# Patient Record
Sex: Female | Born: 1950 | Race: White | Hispanic: No | Marital: Married | Smoking: Never smoker
Health system: Southern US, Community
[De-identification: ages and names within clinical notes are randomized; demographics above are authoritative.]

## PROBLEM LIST (undated history)

## (undated) DIAGNOSIS — G4730 Sleep apnea, unspecified: Secondary | ICD-10-CM

## (undated) DIAGNOSIS — E039 Hypothyroidism, unspecified: Secondary | ICD-10-CM

## (undated) HISTORY — PX: TONSILLECTOMY: SHX5217

## (undated) HISTORY — PX: SHOULDER SURGERY: SHX246

## (undated) HISTORY — PX: CERVICAL DISC SURGERY: SHX588

## (undated) HISTORY — PX: ABDOMINAL HYSTERECTOMY: SHX81

## (undated) HISTORY — PX: LAPAROSCOPIC COLON RESECTION: SUR791

## (undated) HISTORY — PX: COLONOSCOPY: SHX174

## (undated) HISTORY — PX: EYE SURGERY: SHX253

---

## 2013-02-07 DIAGNOSIS — N3281 Overactive bladder: Secondary | ICD-10-CM | POA: Insufficient documentation

## 2019-07-14 HISTORY — PX: TRANSTHORACIC ECHOCARDIOGRAM: SHX275

## 2019-11-13 HISTORY — DX: Morbid (severe) obesity due to excess calories: E66.01

## 2020-01-02 HISTORY — PX: BARIATRIC SURGERY: SHX1103

## 2020-03-16 DIAGNOSIS — Z9884 Bariatric surgery status: Secondary | ICD-10-CM | POA: Insufficient documentation

## 2020-11-02 HISTORY — PX: TOTAL HIP ARTHROPLASTY: SHX124

## 2020-11-06 HISTORY — PX: ESOPHAGOGASTRODUODENOSCOPY: SHX1529

## 2021-01-22 ENCOUNTER — Encounter: Payer: Self-pay | Admitting: Nurse Practitioner

## 2021-01-22 DIAGNOSIS — E063 Autoimmune thyroiditis: Secondary | ICD-10-CM | POA: Insufficient documentation

## 2021-01-22 DIAGNOSIS — F419 Anxiety disorder, unspecified: Secondary | ICD-10-CM | POA: Insufficient documentation

## 2021-01-22 DIAGNOSIS — G4733 Obstructive sleep apnea (adult) (pediatric): Secondary | ICD-10-CM | POA: Insufficient documentation

## 2021-01-27 ENCOUNTER — Other Ambulatory Visit: Payer: Self-pay

## 2021-01-27 ENCOUNTER — Encounter: Payer: Self-pay | Admitting: Nurse Practitioner

## 2021-01-27 ENCOUNTER — Ambulatory Visit (INDEPENDENT_AMBULATORY_CARE_PROVIDER_SITE_OTHER): Payer: Medicare Other | Admitting: Nurse Practitioner

## 2021-01-27 VITALS — BP 93/59 | HR 53 | Temp 98.2°F | Ht 67.0 in | Wt 167.6 lb

## 2021-01-27 DIAGNOSIS — Z7689 Persons encountering health services in other specified circumstances: Secondary | ICD-10-CM

## 2021-01-27 DIAGNOSIS — D1724 Benign lipomatous neoplasm of skin and subcutaneous tissue of left leg: Secondary | ICD-10-CM

## 2021-01-27 DIAGNOSIS — E039 Hypothyroidism, unspecified: Secondary | ICD-10-CM | POA: Diagnosis not present

## 2021-01-27 DIAGNOSIS — F419 Anxiety disorder, unspecified: Secondary | ICD-10-CM

## 2021-01-27 DIAGNOSIS — G4733 Obstructive sleep apnea (adult) (pediatric): Secondary | ICD-10-CM

## 2021-01-27 DIAGNOSIS — Z9989 Dependence on other enabling machines and devices: Secondary | ICD-10-CM

## 2021-01-27 DIAGNOSIS — Z9884 Bariatric surgery status: Secondary | ICD-10-CM

## 2021-01-27 MED ORDER — CITALOPRAM HYDROBROMIDE 20 MG PO TABS: 20.0000 mg | ORAL_TABLET | Freq: Every day | ORAL | 4 refills | Status: DC

## 2021-01-27 NOTE — Assessment & Plan Note (Addendum)
Chronic, ongoing, currently taking 40 MG Celexa daily.  Discussed with patient recommended dosing for patients >65, 20 MG daily recommended dosing.  She is okay with change to lower dosing, script sent and she will stop the 40 MG dosing.  Denies SI/HI.  Has been on medication for years, but was due to stressful job she no longer has.  Return in 4 weeks for physical.

## 2021-01-27 NOTE — Progress Notes (Signed)
New Patient Office Visit  Subjective:  Patient ID: [name] Robinson, female    DOB: 01-24-1951  Age: 70 y.o. MRN: [id number]  CC:  Chief Complaint  Patient presents with   Establish Care   Mass    Patient would like to discuss mass on her left hip. She was told by last physician that it was fatty tissue and since losing weight patient says that it looks more like a tumor and would like to discuss with provider.    HPI [name] Robinson presents for new patient visit to establish care.  Introduced to Designer, jewellery role and practice setting.  All questions answered.  Discussed provider/patient relationship and expectations.  Recently moved here from [place].  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    Does use her CPAP nightly, was once in 300 lbs range, reports does not need it but still uses.  SKIN LESION History of bariatric surgery on 12/22/19 -- was over 300 lbs and now 160 range.  She is concerned about a mass to upper left thigh which is more prominent since surgery, was told it was a fat mass. Duration: months Location: left upper thigh Painful: yes Itching: no Onset: gradual Context: bigger Associated signs and symptoms:  History of skin cancer: no History of precancerous skin lesions: no Family history of skin cancer: no   HYPOTHYROIDISM Continues on Levothyroxine 137 MCG daily. Thyroid control status:stable Satisfied with current treatment? yes Medication side effects: no Medication compliance: good compliance Etiology of hypothyroidism:  Recent dose adjustment:no Fatigue: no Cold intolerance: no Heat intolerance: no Weight gain: no Weight loss: no Constipation: no Diarrhea/loose stools: no Palpitations: no Lower extremity edema: no Anxiety/depressed mood: no   ANXIETY/STRESS Taking Celexa 40 MG daily - has been on for years when she was in stressful job -- she is okay with reducing to 20 MG which is recommended dose for her  age. Duration:stable Anxious mood: none Excessive worrying: no Irritability: no  Sweating: no Nausea: no Palpitations:no Hyperventilation: no Panic attacks: no Agoraphobia: no  Obscessions/compulsions: no Depressed mood: no Depression screen PHQ 2/9 01/27/2021  Decreased Interest 0  Down, Depressed, Hopeless 0  PHQ - 2 Score 0   Anhedonia: no Weight changes: no Insomnia: none Hypersomnia: no Fatigue/loss of energy: no Feelings of worthlessness: no Feelings of guilt: no Impaired concentration/indecisiveness: no Suicidal ideations: no  Crying spells: no Recent Stressors/Life Changes: no   Relationship problems: no   Family stress: no     Financial stress: no    Job stress: no    Recent death/loss: no    History reviewed. No pertinent past medical history.  Past Surgical History:  Procedure Laterality Date   BARIATRIC SURGERY N/A 01/02/2020   CERVICAL DISC SURGERY N/A    EYE SURGERY     SHOULDER SURGERY Left    TONSILLECTOMY N/A    TOTAL HIP ARTHROPLASTY Right 10/2020    Family History  Problem Relation Age of Onset   Thyroid disease Mother    Cancer - Lung Father     Social History   Socioeconomic History   Marital status: Married    Spouse name: Not on file   Number of children: Not on file   Years of education: Not on file   Highest education level: Not on file  Occupational History   Not on file  Tobacco Use   Smoking status: Never   Smokeless tobacco: Never  Vaping Use   Vaping Use: Never used  Substance and  Sexual Activity   Alcohol use: Never   Drug use: Never   Sexual activity: Not Currently  Other Topics Concern   Not on file  Social History Narrative   Not on file   Social Determinants of Health   Financial Resource Strain: Low Risk    Difficulty of Paying Living Expenses: Not hard at all  Food Insecurity: No Food Insecurity   Worried About Running Out of Food in the Last Year: Never true   [place] in the Last Year:  Never true  Transportation Needs: No Transportation Needs   Lack of Transportation (Medical): No   Lack of Transportation (Non-Medical): No  Physical Activity: Insufficiently Active   Days of Exercise per Week: 4 days   Minutes of Exercise per Session: 30 min  Stress: No Stress Concern Present   Feeling of Stress : Not at all  Social Connections: Moderately Isolated   Frequency of Communication with Friends and Family: More than three times a week   Frequency of Social Gatherings with Friends and Family: More than three times a week   Attends Religious Services: Never   Marine scientist or Organizations: No   Attends Music therapist: Never   Marital Status: Married  Human resources officer Violence: Not At Risk   Fear of Current or Ex-Partner: No   Emotionally Abused: No   Physically Abused: No   Sexually Abused: No    ROS Review of Systems  Constitutional:  Negative for activity change, appetite change, diaphoresis, fatigue and fever.  Respiratory:  Negative for cough, chest tightness and shortness of breath.   Cardiovascular:  Negative for chest pain, palpitations and leg swelling.  Gastrointestinal: Negative.   Endocrine: Negative for cold intolerance and heat intolerance.  Musculoskeletal:  Positive for arthralgias.  Neurological: Negative.   Psychiatric/Behavioral: Negative.     Objective:   Today's Vitals: BP (!) 93/59   Pulse (!) 53   Temp 98.2 F (36.8 C) (Oral)   Ht 5\' 7"  (1.702 m)   Wt 167 lb 9.6 oz (76 kg)   SpO2 97%   BMI 26.25 kg/m   Physical Exam Vitals and nursing note reviewed.  Constitutional:      General: She is awake. She is not in acute distress.    Appearance: She is well-developed and well-groomed. She is not ill-appearing or toxic-appearing.  HENT:     Head: Normocephalic.     Right Ear: Hearing normal.     Left Ear: Hearing normal.  Eyes:     General: Lids are normal.        Right eye: No discharge.        Left eye: No  discharge.     Conjunctiva/sclera: Conjunctivae normal.     Pupils: Pupils are equal, round, and reactive to light.  Neck:     Thyroid: No thyromegaly.     Vascular: No carotid bruit.  Cardiovascular:     Rate and Rhythm: Normal rate and regular rhythm.     Heart sounds: Normal heart sounds. No murmur heard.   No gallop.  Pulmonary:     Effort: Pulmonary effort is normal. No accessory muscle usage or respiratory distress.     Breath sounds: Normal breath sounds.  Abdominal:     General: Bowel sounds are normal.     Palpations: Abdomen is soft.  Musculoskeletal:     Cervical back: Normal range of motion and neck supple.     Right lower leg: No  edema.     Left lower leg: No edema.  Lymphadenopathy:     Cervical: No cervical adenopathy.  Skin:    General: Skin is warm and dry.  Neurological:     Mental Status: She is alert and oriented to person, place, and time.     Deep Tendon Reflexes: Reflexes are normal and symmetric.  Psychiatric:        Attention and Perception: Attention normal.        Mood and Affect: Mood normal.        Speech: Speech normal.        Behavior: Behavior normal. Behavior is cooperative.        Thought Content: Thought content normal.    Assessment & Plan:   Problem List Items Addressed This Visit       Respiratory   OSA on CPAP    Ongoing, reports she was told she no longer needs, but continue to use.  Continue CPAP use, as history of OSA and finds comfort with use.         Endocrine   Hypothyroidism    Chronic, ongoing.  Continue current medication regimen and adjust as needed.  Plan for thyroid labs next visit.       Relevant Medications   levothyroxine (SYNTHROID) 137 MCG tablet     Other   History of bariatric surgery    On 12/22/2019 at [facility], was over 300 lbs and now in 160 range.  Overall reports feeling better.  Continue collaboration with Psychologist, sport and exercise at [facility].  Monitor B12 level.       Anxiety    Chronic, ongoing, currently taking  40 MG Celexa daily.  Discussed with patient recommended dosing for patients >65, 20 MG daily recommended dosing.  She is okay with change to lower dosing, script sent and she will stop the 40 MG dosing.  Denies SI/HI.  Has been on medication for years, but was due to stressful job she no longer has.  Return in 4 weeks for physical.       Relevant Medications   citalopram (CELEXA) 20 MG tablet   Lipoma of left lower extremity    Noted post bariatric surgery to left upper thigh, with some discomfort reported to area.  Will obtain soft tissue ultrasound of area and plan on referral to general surgery dependent on findings, as does report increased size and discomfort, may benefit removal.       Relevant Orders   [place] LT LOWER EXTREM LTD SOFT TISSUE NON VASCULAR   Other Visit Diagnoses     Encounter to establish care    -  Primary       Outpatient Encounter Medications as of 01/27/2021  Medication Sig   acetaminophen (TYLENOL) 500 MG tablet Take 1 tablet by mouth every 8 hours for 3 days, then take every 8 hours as needed for mild pain   cetirizine (ZYRTEC) 10 MG tablet Take by mouth.   citalopram (CELEXA) 20 MG tablet Take 1 tablet (20 mg total) by mouth daily.   ergocalciferol (VITAMIN D2) 1.25 MG (50000 UT) capsule    fluticasone (FLONASE) 50 MCG/ACT nasal spray Place into the nose.   levothyroxine (SYNTHROID) 137 MCG tablet Take by mouth.   lipase/protease/amylase (CREON) 12000-38000 units CPEP capsule Take 1 Units by mouth 3 (three) times daily before meals.   [DISCONTINUED] citalopram (CELEXA) 40 MG tablet Take by mouth.   No facility-administered encounter medications on file as of 01/27/2021.    Follow-up: Return  in about 4 weeks (around 02/24/2021) for Annual physical -- Medicare.   Venita Lick, NP

## 2021-01-27 NOTE — Assessment & Plan Note (Signed)
Noted post bariatric surgery to left upper thigh, with some discomfort reported to area.  Will obtain soft tissue ultrasound of area and plan on referral to general surgery dependent on findings, as does report increased size and discomfort, may benefit removal.

## 2021-01-27 NOTE — Assessment & Plan Note (Signed)
Ongoing, reports she was told she no longer needs, but continue to use.  Continue CPAP use, as history of OSA and finds comfort with use.

## 2021-01-27 NOTE — Assessment & Plan Note (Signed)
Chronic, ongoing.  Continue current medication regimen and adjust as needed.  Plan for thyroid labs next visit.

## 2021-01-27 NOTE — Patient Instructions (Signed)

## 2021-01-27 NOTE — Assessment & Plan Note (Signed)
On 12/22/2019 at [facility], was over 300 lbs and now in 160 range.  Overall reports feeling better.  Continue collaboration with Psychologist, sport and exercise at [facility].  Monitor B12 level.

## 2021-02-26 ENCOUNTER — Encounter: Payer: Self-pay | Admitting: Nurse Practitioner

## 2021-02-26 DIAGNOSIS — E559 Vitamin D deficiency, unspecified: Secondary | ICD-10-CM | POA: Insufficient documentation

## 2021-02-28 ENCOUNTER — Encounter: Payer: Self-pay | Admitting: Nurse Practitioner

## 2021-02-28 ENCOUNTER — Other Ambulatory Visit: Payer: Self-pay

## 2021-02-28 ENCOUNTER — Ambulatory Visit (INDEPENDENT_AMBULATORY_CARE_PROVIDER_SITE_OTHER): Payer: Medicare Other | Admitting: Nurse Practitioner

## 2021-02-28 VITALS — BP 108/68 | HR 64 | Temp 98.4°F | Ht 67.0 in | Wt 156.8 lb

## 2021-02-28 DIAGNOSIS — E039 Hypothyroidism, unspecified: Secondary | ICD-10-CM | POA: Diagnosis not present

## 2021-02-28 DIAGNOSIS — E538 Deficiency of other specified B group vitamins: Secondary | ICD-10-CM

## 2021-02-28 DIAGNOSIS — Z1231 Encounter for screening mammogram for malignant neoplasm of breast: Secondary | ICD-10-CM

## 2021-02-28 DIAGNOSIS — S61225A Laceration with foreign body of left ring finger without damage to nail, initial encounter: Secondary | ICD-10-CM | POA: Diagnosis not present

## 2021-02-28 DIAGNOSIS — Z23 Encounter for immunization: Secondary | ICD-10-CM

## 2021-02-28 DIAGNOSIS — S61225D Laceration with foreign body of left ring finger without damage to nail, subsequent encounter: Secondary | ICD-10-CM

## 2021-02-28 DIAGNOSIS — F419 Anxiety disorder, unspecified: Secondary | ICD-10-CM

## 2021-02-28 DIAGNOSIS — Z0000 Encounter for general adult medical examination without abnormal findings: Secondary | ICD-10-CM

## 2021-02-28 DIAGNOSIS — M810 Age-related osteoporosis without current pathological fracture: Secondary | ICD-10-CM | POA: Insufficient documentation

## 2021-02-28 DIAGNOSIS — Z9884 Bariatric surgery status: Secondary | ICD-10-CM

## 2021-02-28 DIAGNOSIS — Z1159 Encounter for screening for other viral diseases: Secondary | ICD-10-CM

## 2021-02-28 DIAGNOSIS — M85851 Other specified disorders of bone density and structure, right thigh: Secondary | ICD-10-CM | POA: Diagnosis not present

## 2021-02-28 DIAGNOSIS — E559 Vitamin D deficiency, unspecified: Secondary | ICD-10-CM

## 2021-02-28 DIAGNOSIS — E7800 Pure hypercholesterolemia, unspecified: Secondary | ICD-10-CM

## 2021-02-28 DIAGNOSIS — M85859 Other specified disorders of bone density and structure, unspecified thigh: Secondary | ICD-10-CM | POA: Insufficient documentation

## 2021-02-28 MED ORDER — SHINGRIX 50 MCG/0.5ML IM SUSR: 0.5000 mL | Freq: Once | INTRAMUSCULAR | 0 refills | Status: AC

## 2021-02-28 MED ORDER — ERGOCALCIFEROL 1.25 MG (50000 UT) PO CAPS: 50000.0000 [IU] | ORAL_CAPSULE | ORAL | 5 refills | Status: DC

## 2021-02-28 MED ORDER — SHINGRIX 50 MCG/0.5ML IM SUSR: 0.5000 mL | Freq: Once | INTRAMUSCULAR | 0 refills | Status: DC

## 2021-02-28 NOTE — Assessment & Plan Note (Signed)
Noted on DEXA April 2021, plan on repeat in 5 years and continue weekly Vitamin D supplement + adequate calcium intake. 

## 2021-02-28 NOTE — Patient Instructions (Addendum)
[facility]  Address: [address], [place], [place] [address]  Phone: [phone]  Mammogram A mammogram is an X-ray of the breasts. This is done to check for changes that are not normal. This test can look for changes that may be caused by breast cancer or other problems. Mammograms are regularly done on women beginning at age 68. A man may have a mammogram if he has a lump or swelling in his breast. Tell a doctor: About any allergies you have. If you have breast implants. If you have had breast disease, biopsy, or surgery. If you have a family history of breast cancer. If you are breastfeeding. Whether you are pregnant or may be pregnant. What are the risks? Generally, this is a safe procedure. But problems may occur, including: Being exposed to radiation. Radiation levels are very low with this test. The need for more tests. The results were not read properly. Trouble finding breast cancer in women with dense breasts. What happens before the test? Have this test done about 1-2 weeks after your menstrual period. This is often when your breasts are the least tender. If you are visiting a new doctor or clinic, have any past mammogram images sent to your new doctor's office. Wash your breasts and under your arms on the day of the test. Do not use deodorants, perfumes, lotions, or powders on the day of the test. Take off any jewelry from your neck. Wear clothes that you can change into and out of easily. What happens during the test?  You will take off your clothes from the waist up. You will put on a gown. You will stand in front of the X-ray machine. Each breast will be placed between two plastic or glass plates. The plates will press down on your breast for a few seconds. Try to relax. This does not cause any harm to your breasts. It may not feel comfortable, but it will be very brief. X-rays will be taken from different angles of each  breast. The procedure may vary among doctors and hospitals. What can I expect after the test? The mammogram will be read by a specialist (radiologist). You may need to do parts of the test again. This depends on the quality of the images. You may go back to your normal activities. It is up to you to get the results of your test. Ask how to get your results when they are ready. Summary A mammogram is an X-ray of the breasts. It looks for changes that may be caused by breast cancer or other problems. A man may have this test if he has a lump or swelling in his breast. Before the test, tell your doctor about any breast problems that you have had in the past. Have this test done about 1-2 weeks after your menstrual period. Ask when your test results will be ready. Make sure you get your test results. This information is not intended to replace advice given to you by your health care provider. Make sure you discuss any questions you have with your health care provider. Document Revised: 05/31/2020 Document Reviewed: 05/31/2020 Elsevier Patient Education  2022 [employer] Inc[employer]   

## 2021-02-28 NOTE — Assessment & Plan Note (Signed)
History of low levels with osteopenia noted on DEXA April 2021, continue weekly supplement.  Refills sent and check level today.

## 2021-02-28 NOTE — Assessment & Plan Note (Signed)
Chronic, ongoing, taking reduce Celexa 20 MG (due to age) and tolerating reduction. Denies SI/HI.  Has been on medication for years, but was due to stressful job she no longer has.  Return in 6 months, could consider discontinuation in future if requested by patient.

## 2021-02-28 NOTE — Progress Notes (Addendum)
BP 108/68   Pulse 64   Temp 98.4 F (36.9 C) (Oral)   Ht 5\' 7"  (1.702 m)   Wt 156 lb 12.8 oz (71.1 kg)   SpO2 99%   BMI 24.56 kg/m    Subjective:    Patient ID: [name] Robinson, female    DOB: September 25, 1950, 70 y.o.   MRN: [id number]  HPI: [name] Robinson is a 70 y.o. female presenting on 02/28/2021 for examination.  She currently lives with: Menopausal Symptoms: no  On labs 12/14/20 noted Alkaline Phosphatase = 121, H/H 11.4/33.1.  She reports a small cut on middle right finger, on nail, that is healing.   HYPOTHYROIDISM Continues on Levothyroxine 137 MCG daily. Thyroid control status:stable Satisfied with current treatment? yes Medication side effects: no Medication compliance: good compliance Etiology of hypothyroidism: Recent dose adjustment:no Fatigue: no Cold intolerance: no Heat intolerance: no Weight gain: no Weight loss: no Constipation: no Diarrhea/loose stools: no Palpitations: no Lower extremity edema: no Anxiety/depressed mood: no    ANXIETY/STRESS Taking Celexa 20 MG daily.  History of Vitamin D referral, last level 12/14/20 = 28. Duration:stable Anxious mood: none Excessive worrying: no Irritability: no  Sweating: no Nausea: no Palpitations:no Hyperventilation: no Panic attacks: no Agoraphobia: no  Obscessions/compulsions: no Depressed mood: no Depression screen [facility] 2/9 02/28/2021 02/28/2021 01/27/2021  Decreased Interest 0 0 0  Down, Depressed, Hopeless 0 0 0  PHQ - 2 Score 0 0 0  Altered sleeping 0 - -  Tired, decreased energy 0 - -  Change in appetite 0 - -  Feeling bad or failure about yourself  0 - -  Trouble concentrating 0 - -  Moving slowly or fidgety/restless 0 - -  Suicidal thoughts 0 - -  PHQ-9 Score 0 - -  Difficult doing work/chores Not difficult at all - -  Anhedonia: no Weight changes: no Insomnia: none Hypersomnia: no Fatigue/loss of energy: no Feelings of worthlessness: no Feelings of guilt: no Impaired  concentration/indecisiveness: no Suicidal ideations: no  Crying spells: no Recent Stressors/Life Changes: no   Relationship problems: no   Family stress: no     Financial stress: no    Job stress: no    Recent death/loss: no  GAD 7 : Generalized Anxiety Score 02/28/2021  Nervous, Anxious, on Edge 0  Control/stop worrying 0  Worry too much - different things 0  Trouble relaxing 0  Restless 0  Easily annoyed or irritable 0  Afraid - awful might happen 0  Total GAD 7 Score 0  Anxiety Difficulty Not difficult at all   The patient does not have a history of falls. I did not complete a risk assessment for falls. A plan of care for falls was not documented.   Past Medical History:  History reviewed. No pertinent past medical history.  Surgical History:  Past Surgical History:  Procedure Laterality Date   BARIATRIC SURGERY N/A 01/02/2020   CERVICAL DISC SURGERY N/A    EYE SURGERY     SHOULDER SURGERY Left    TONSILLECTOMY N/A    TOTAL HIP ARTHROPLASTY Right 10/2020    Medications:  Current Outpatient Medications on File Prior to Visit  Medication Sig   acetaminophen (TYLENOL) 500 MG tablet Take 1 tablet by mouth every 8 hours for 3 days, then take every 8 hours as needed for mild pain   cetirizine (ZYRTEC) 10 MG tablet Take by mouth.   citalopram (CELEXA) 20 MG tablet Take 1 tablet (20 mg total) by mouth daily.  fluticasone (FLONASE) 50 MCG/ACT nasal spray Place into the nose.   levothyroxine (SYNTHROID) 137 MCG tablet Take by mouth.   lipase/protease/amylase (CREON) 12000-38000 units CPEP capsule Take 1 Units by mouth 3 (three) times daily before meals.   No current facility-administered medications on file prior to visit.    Allergies:  Allergies  Allergen Reactions   Solifenacin Rash    Patient had bad rash with this med. Patient had bad rash with this med. Patient had bad rash with this med. Patient had bad rash with this med.     Social History:  Social  History   Socioeconomic History   Marital status: Married    Spouse name: Not on file   Number of children: Not on file   Years of education: Not on file   Highest education level: Not on file  Occupational History   Not on file  Tobacco Use   Smoking status: Never   Smokeless tobacco: Never  Vaping Use   Vaping Use: Never used  Substance and Sexual Activity   Alcohol use: Never   Drug use: Never   Sexual activity: Not Currently  Other Topics Concern   Not on file  Social History Narrative   Not on file   Social Determinants of Health   Financial Resource Strain: Low Risk    Difficulty of Paying Living Expenses: Not hard at all  Food Insecurity: No Food Insecurity   Worried About Charity fundraiser in the Last Year: Never true   [place] in the Last Year: Never true  Transportation Needs: No Transportation Needs   Lack of Transportation (Medical): No   Lack of Transportation (Non-Medical): No  Physical Activity: Insufficiently Active   Days of Exercise per Week: 4 days   Minutes of Exercise per Session: 30 min  Stress: No Stress Concern Present   Feeling of Stress : Not at all  Social Connections: Moderately Isolated   Frequency of Communication with Friends and Family: More than three times a week   Frequency of Social Gatherings with Friends and Family: More than three times a week   Attends Religious Services: Never   Marine scientist or Organizations: No   Attends Music therapist: Never   Marital Status: Married  Human resources officer Violence: Not At Risk   Fear of Current or Ex-Partner: No   Emotionally Abused: No   Physically Abused: No   Sexually Abused: No   Social History   Tobacco Use  Smoking Status Never  Smokeless Tobacco Never   Social History   Substance and Sexual Activity  Alcohol Use Never    Family History:  Family History  Problem Relation Age of Onset   Thyroid disease Mother    Cancer - Lung Father      Past medical history, surgical history, medications, allergies, family history and social history reviewed with patient today and changes made to appropriate areas of the chart.   ROS All other ROS negative except what is listed above and in the HPI.      Objective:    BP 108/68   Pulse 64   Temp 98.4 F (36.9 C) (Oral)   Ht 5\' 7"  (1.702 m)   Wt 156 lb 12.8 oz (71.1 kg)   SpO2 99%   BMI 24.56 kg/m   Wt Readings from Last 3 Encounters:  02/28/21 156 lb 12.8 oz (71.1 kg)  01/27/21 167 lb 9.6 oz (76 kg)  Physical Exam Vitals and nursing note reviewed. Exam conducted with a chaperone present.  Constitutional:      General: She is awake. She is not in acute distress.    Appearance: She is well-developed. She is not ill-appearing.  HENT:     Head: Normocephalic and atraumatic.     Right Ear: Hearing, tympanic membrane, ear canal and external ear normal. No drainage.     Left Ear: Hearing, tympanic membrane, ear canal and external ear normal. No drainage.     Nose: Nose normal.     Right Sinus: No maxillary sinus tenderness or frontal sinus tenderness.     Left Sinus: No maxillary sinus tenderness or frontal sinus tenderness.     Mouth/Throat:     Mouth: Mucous membranes are moist.     Pharynx: Oropharynx is clear. Uvula midline. No pharyngeal swelling, oropharyngeal exudate or posterior oropharyngeal erythema.  Eyes:     General: Lids are normal.        Right eye: No discharge.        Left eye: No discharge.     Extraocular Movements: Extraocular movements intact.     Conjunctiva/sclera: Conjunctivae normal.     Pupils: Pupils are equal, round, and reactive to light.     Visual Fields: Right eye visual fields normal and left eye visual fields normal.  Neck:     Thyroid: No thyromegaly.     Vascular: No carotid bruit.     Trachea: Trachea normal.  Cardiovascular:     Rate and Rhythm: Normal rate and regular rhythm.     Heart sounds: Normal heart sounds. No murmur  heard.   No gallop.  Pulmonary:     Effort: Pulmonary effort is normal. No accessory muscle usage or respiratory distress.     Breath sounds: Normal breath sounds.  Chest:  Breasts:    Right: Normal. No axillary adenopathy or supraclavicular adenopathy.     Left: Normal. No axillary adenopathy or supraclavicular adenopathy.  Abdominal:     General: Bowel sounds are normal.     Palpations: Abdomen is soft. There is no hepatomegaly or splenomegaly.     Tenderness: There is no abdominal tenderness.  Musculoskeletal:        General: Normal range of motion.     Cervical back: Normal range of motion and neck supple.     Right lower leg: No edema.     Left lower leg: No edema.  Lymphadenopathy:     Head:     Right side of head: No submental, submandibular, tonsillar, preauricular or posterior auricular adenopathy.     Left side of head: No submental, submandibular, tonsillar, preauricular or posterior auricular adenopathy.     Cervical: No cervical adenopathy.     Upper Body:     Right upper body: No supraclavicular, axillary or pectoral adenopathy.     Left upper body: No supraclavicular, axillary or pectoral adenopathy.  Skin:    General: Skin is warm and dry.     Capillary Refill: Capillary refill takes less than 2 seconds.     Findings: Abrasion present. No rash.     Comments: Small abrasion to left finger, after injury on nail, overall healing with some crusting and pink wound bed.  Neurological:     Mental Status: She is alert and oriented to person, place, and time.     Cranial Nerves: Cranial nerves are intact.     Gait: Gait is intact.     Deep Tendon Reflexes:  Reflexes are normal and symmetric.     Reflex Scores:      Brachioradialis reflexes are 2+ on the right side and 2+ on the left side.      Patellar reflexes are 2+ on the right side and 2+ on the left side. Psychiatric:        Attention and Perception: Attention normal.        Mood and Affect: Mood normal.         Speech: Speech normal.        Behavior: Behavior normal. Behavior is cooperative.        Thought Content: Thought content normal.        Judgment: Judgment normal.    No results found for this or any previous visit.    Assessment & Plan:   Problem List Items Addressed This Visit       Endocrine   Hypothyroidism - Primary    Chronic, ongoing.  Continue current medication regimen and adjust as needed.  TSH, Free T4, and thyroid antibody today.       Relevant Orders   CBC with Differential/Platelet   TSH   T4, free   Thyroid peroxidase antibody     Musculoskeletal and Integument   Osteopenia of femoral neck    Noted on DEXA April 2021, plan on repeat in 5 years and continue weekly Vitamin D supplement + adequate calcium intake.         Other   History of bariatric surgery    On 12/22/2019 at [facility], was over 300 lbs and now in 160 range.  Overall reports feeling better.  Continue collaboration with Psychologist, sport and exercise at [facility].  Monitor B12 level.       Anxiety    Chronic, ongoing, taking reduce Celexa 20 MG (due to age) and tolerating reduction. Denies SI/HI.  Has been on medication for years, but was due to stressful job she no longer has.  Return in 6 months, could consider discontinuation in future if requested by patient.       Vitamin D deficiency    History of low levels with osteopenia noted on DEXA April 2021, continue weekly supplement.  Refills sent and check level today.       Relevant Orders   VITAMIN D 25 Hydroxy (Vit-D Deficiency, Fractures)   Other Visit Diagnoses     Elevated low density lipoprotein (LDL) cholesterol level       Check lipid panel today and initiate medication as needed.   Relevant Orders   Comprehensive metabolic panel   Lipid Panel w/o Chol/HDL Ratio   Laceration with foreign body of left ring finger without damage to nail, subsequent encounter       Overall healing -- not up to date on Td, will provide today.   Relevant Orders   Td vaccine  greater than or equal to 7yo preservative free IM   B12 deficiency       History of low levels, check today with CBC and start supplement as needed -- history of bariatric surgery.   Relevant Orders   CBC with Differential/Platelet   Vitamin B12   Need for shingles vaccine       Shingrix script sent to pharmacy.   Relevant Medications   Zoster Vaccine Adjuvanted [facility]) injection   Zoster Vaccine Adjuvanted [facility]) injection (Start on 08/31/2021)   Encounter for screening mammogram for malignant neoplasm of breast       Mammogram ordered.   Relevant Orders   MM 3D  SCREEN BREAST BILATERAL   Need for Td vaccine       Due for Td, will provide today due to cut on finger.   Relevant Orders   Td vaccine greater than or equal to 7yo preservative free IM   Need for hepatitis C screening test       Hep C screening today per guidelines, discussed with patient.    Relevant Orders   Hepatitis C antibody        Follow up plan: Return in about 6 months (around 08/31/2021) for Hypothyroid, HLD.   LABORATORY TESTING:  - Pap smear: not applicable  IMMUNIZATIONS:   - Tdap: Tetanus vaccination status reviewed: provided today - Influenza: Up to date - Pneumovax: Administered today - Prevnar: Up to date - HPV: Not applicable - Zostavax vaccine:  ordered today  SCREENING: -Mammogram: Up to date -- ordered today - Colonoscopy: Up to date  - Bone Density: Up to date  -Hearing Test: Not applicable  -Spirometry: Not applicable   PATIENT COUNSELING:   Advised to take 1 mg of folate supplement per day if capable of pregnancy.   Sexuality: Discussed sexually transmitted diseases, partner selection, use of condoms, avoidance of unintended pregnancy  and contraceptive alternatives.   Advised to avoid cigarette smoking.  I discussed with the patient that most people either abstain from alcohol or drink within safe limits (<=14/week and <=4 drinks/occasion for males, <=7/weeks and <= 3  drinks/occasion for females) and that the risk for alcohol disorders and other health effects rises proportionally with the number of drinks per week and how often a drinker exceeds daily limits.  Discussed cessation/primary prevention of drug use and availability of treatment for abuse.   Diet: Encouraged to adjust caloric intake to maintain  or achieve ideal body weight, to reduce intake of dietary saturated fat and total fat, to limit sodium intake by avoiding high sodium foods and not adding table salt, and to maintain adequate dietary potassium and calcium preferably from fresh fruits, vegetables, and low-fat dairy products.    Stressed the importance of regular exercise  Injury prevention: Discussed safety belts, safety helmets, smoke detector, smoking near bedding or upholstery.   Dental health: Discussed importance of regular tooth brushing, flossing, and dental visits.    Return in about 6 months (around 08/31/2021) for Hypothyroid, HLD.

## 2021-02-28 NOTE — Assessment & Plan Note (Signed)
On 12/22/2019 at [facility], was over 300 lbs and now in 160 range.  Overall reports feeling better.  Continue collaboration with Psychologist, sport and exercise at [facility].  Monitor B12 level.

## 2021-02-28 NOTE — Assessment & Plan Note (Signed)
Chronic, ongoing.  Continue current medication regimen and adjust as needed.  TSH, Free T4, and thyroid antibody today.

## 2021-03-01 ENCOUNTER — Other Ambulatory Visit: Payer: Self-pay | Admitting: Nurse Practitioner

## 2021-03-01 ENCOUNTER — Ambulatory Visit: Payer: Medicare Other

## 2021-03-01 ENCOUNTER — Encounter: Payer: Self-pay | Admitting: Nurse Practitioner

## 2021-03-01 LAB — LIPID PANEL W/O CHOL/HDL RATIO
Cholesterol, Total: 115 mg/dL (ref 100–199)
HDL: 69 mg/dL (ref >39)
LDL Chol Calc (NIH): 32 mg/dL (ref 0–99)
Triglycerides: 64 mg/dL (ref 0–149)
VLDL Cholesterol Cal: 14 mg/dL (ref 5–40)

## 2021-03-01 LAB — COMPREHENSIVE METABOLIC PANEL
ALT: 34 IU/L — ABNORMAL HIGH (ref 0–32)
AST: 50 IU/L — ABNORMAL HIGH (ref 0–40)
Albumin/Globulin Ratio: 1.6 (ref 1.2–2.2)
Albumin: 3.7 g/dL — ABNORMAL LOW (ref 3.8–4.8)
Alkaline Phosphatase: 132 IU/L — ABNORMAL HIGH (ref 44–121)
BUN/Creatinine Ratio: 25 (ref 12–28)
BUN: 16 mg/dL (ref 8–27)
Bilirubin Total: 0.6 mg/dL (ref 0.0–1.2)
CO2: 23 mmol/L (ref 20–29)
Calcium: 9.3 mg/dL (ref 8.7–10.3)
Chloride: 105 mmol/L (ref 96–106)
Creatinine, Ser: 0.63 mg/dL (ref 0.57–1.00)
Globulin, Total: 2.3 g/dL (ref 1.5–4.5)
Glucose: 76 mg/dL (ref 65–99)
Potassium: 3.6 mmol/L (ref 3.5–5.2)
Sodium: 143 mmol/L (ref 134–144)
Total Protein: 6 g/dL (ref 6.0–8.5)
eGFR: 97 mL/min/{1.73_m2} (ref >59)

## 2021-03-01 LAB — CBC WITH DIFFERENTIAL/PLATELET
Basophils Absolute: 0 10*3/uL (ref 0.0–0.2)
Basos: 1 %
EOS (ABSOLUTE): 0 10*3/uL (ref 0.0–0.4)
Eos: 0 %
Hematocrit: 31.9 % — ABNORMAL LOW (ref 34.0–46.6)
Hemoglobin: 10.6 g/dL — ABNORMAL LOW (ref 11.1–15.9)
Immature Grans (Abs): 0 10*3/uL (ref 0.0–0.1)
Immature Granulocytes: 0 %
Lymphocytes Absolute: 1.4 10*3/uL (ref 0.7–3.1)
Lymphs: 30 %
MCH: 30.5 pg (ref 26.6–33.0)
MCHC: 33.2 g/dL (ref 31.5–35.7)
MCV: 92 fL (ref 79–97)
Monocytes Absolute: 0.3 10*3/uL (ref 0.1–0.9)
Monocytes: 6 %
Neutrophils Absolute: 3 10*3/uL (ref 1.4–7.0)
Neutrophils: 63 %
Platelets: 170 10*3/uL (ref 150–450)
RBC: 3.47 x10E6/uL — ABNORMAL LOW (ref 3.77–5.28)
RDW: 13 % (ref 11.7–15.4)
WBC: 4.7 10*3/uL (ref 3.4–10.8)

## 2021-03-01 LAB — VITAMIN D 25 HYDROXY (VIT D DEFICIENCY, FRACTURES): Vit D, 25-Hydroxy: 42.2 ng/mL (ref 30.0–100.0)

## 2021-03-01 LAB — HEPATITIS C ANTIBODY: Hep C Virus Ab: 0.1 s/co ratio (ref 0.0–0.9)

## 2021-03-01 LAB — VITAMIN B12: Vitamin B-12: 368 pg/mL (ref 232–1245)

## 2021-03-01 LAB — TSH: TSH: 1.25 u[IU]/mL (ref 0.450–4.500)

## 2021-03-01 LAB — THYROID PEROXIDASE ANTIBODY: Thyroperoxidase Ab SerPl-aCnc: 197 IU/mL — ABNORMAL HIGH (ref 0–34)

## 2021-03-01 LAB — T4, FREE: Free T4: 1.08 ng/dL (ref 0.82–1.77)

## 2021-03-01 MED ORDER — LEVOTHYROXINE SODIUM 137 MCG PO TABS
137.0000 ug | ORAL_TABLET | Freq: Every day | ORAL | 4 refills | Status: DC
Start: 2021-03-01 — End: 2021-12-01

## 2021-03-01 NOTE — Progress Notes (Signed)
Good morning, please let Sawyer know her labs have returned: - CBC shows some mild anemia with hemoglobin 10.6, would like to see > 11.  This is most likely related to past GI surgery.  B12 level is on low side of normal.  I would recommend taking Vitamin B12 1000 MCG daily and we will recheck next visit. - Liver function tests were mildly elevated, we will continue to monitor these.  Kidney function normal. - Thyroid labs normal, continue current dosing.  Your thyroid antibody is elevated, most likely your thyroid disorder is Hashimoto thyroiditis, I definitely recommend reading about this and diet. - Remainder of labs are fantastic.  No changes needed.  Any questions? Keep being awesome!!  Thank you for allowing me to participate in your care.  I appreciate you. Kindest regards, Helga Asbury

## 2021-03-03 ENCOUNTER — Other Ambulatory Visit: Payer: Self-pay

## 2021-03-03 ENCOUNTER — Ambulatory Visit
Admission: RE | Admit: 2021-03-03 | Discharge: 2021-03-03 | Disposition: A | Discharge status: Home / Self Care | Payer: Medicare Other | Source: Ambulatory Visit | Attending: Nurse Practitioner | Admitting: Nurse Practitioner

## 2021-03-03 DIAGNOSIS — D1724 Benign lipomatous neoplasm of skin and subcutaneous tissue of left leg: Secondary | ICD-10-CM | POA: Diagnosis present

## 2021-03-04 NOTE — Progress Notes (Signed)
Contacted via [place] afternoon Bhavya, the ultrasound to left upper thigh did show a 5 cm lipoma.  This is a fat-filled cyst.  If it causes issue or pain then we could send to general surgery for assessment, but if it is not bothering you then I recommend monitoring area and if any increase size or pain we can send to surgery.  Have a great weekend. Keep being awesome!!  Thank you for allowing me to participate in your care.  I appreciate you. Kindest regards, Tanazia Achee

## 2021-03-06 DIAGNOSIS — D1724 Benign lipomatous neoplasm of skin and subcutaneous tissue of left leg: Secondary | ICD-10-CM

## 2021-03-10 DIAGNOSIS — K909 Intestinal malabsorption, unspecified: Secondary | ICD-10-CM | POA: Insufficient documentation

## 2021-03-15 ENCOUNTER — Other Ambulatory Visit: Payer: Self-pay

## 2021-03-15 ENCOUNTER — Ambulatory Visit
Admission: RE | Admit: 2021-03-15 | Discharge: 2021-03-15 | Disposition: A | Discharge status: Home / Self Care | Payer: Medicare Other | Source: Ambulatory Visit | Attending: Nurse Practitioner | Admitting: Nurse Practitioner

## 2021-03-15 DIAGNOSIS — Z1231 Encounter for screening mammogram for malignant neoplasm of breast: Secondary | ICD-10-CM | POA: Diagnosis present

## 2021-03-16 ENCOUNTER — Inpatient Hospital Stay
Admission: RE | Admit: 2021-03-16 | Discharge: 2021-03-16 | Disposition: A | Discharge status: Home / Self Care | Payer: Self-pay | Source: Ambulatory Visit | Attending: *Deleted | Admitting: *Deleted

## 2021-03-16 ENCOUNTER — Other Ambulatory Visit: Payer: Self-pay | Admitting: *Deleted

## 2021-03-16 DIAGNOSIS — Z1231 Encounter for screening mammogram for malignant neoplasm of breast: Secondary | ICD-10-CM

## 2021-03-17 ENCOUNTER — Other Ambulatory Visit: Payer: Self-pay

## 2021-03-17 ENCOUNTER — Ambulatory Visit: Payer: Self-pay | Admitting: Surgery

## 2021-03-17 ENCOUNTER — Telehealth: Payer: Self-pay | Admitting: Surgery

## 2021-03-17 ENCOUNTER — Other Ambulatory Visit: Payer: Self-pay | Admitting: Nurse Practitioner

## 2021-03-17 ENCOUNTER — Encounter: Payer: Self-pay | Admitting: Surgery

## 2021-03-17 ENCOUNTER — Ambulatory Visit (INDEPENDENT_AMBULATORY_CARE_PROVIDER_SITE_OTHER): Payer: Medicare Other | Admitting: Surgery

## 2021-03-17 VITALS — BP 96/59 | HR 51 | Temp 98.3°F | Ht 67.0 in | Wt 156.4 lb

## 2021-03-17 DIAGNOSIS — D1724 Benign lipomatous neoplasm of skin and subcutaneous tissue of left leg: Secondary | ICD-10-CM | POA: Diagnosis not present

## 2021-03-17 DIAGNOSIS — R928 Other abnormal and inconclusive findings on diagnostic imaging of breast: Secondary | ICD-10-CM

## 2021-03-17 NOTE — Patient Instructions (Signed)
Our surgery scheduler Pamala Hurry will call you within 24-48 hours to get you scheduled. If you have not heard from her after 48 hours, please call our office. You will not need to get Covid tested before surgery and have the blue sheet available when she calls to write down important information.   If you have any concerns or questions, please feel free to call our office.

## 2021-03-17 NOTE — Progress Notes (Signed)
Patient ID: [name] Robinson, female   DOB: [birth year], 70 y.o.   MRN: [id number]  Chief Complaint: Left thigh mass x2 years  History of Present Illness [name] Robinson is a 70 y.o. female with an increasing left lateral thigh mass for the last 2 years.  Ultrasound evaluated at 5 cm and consistent with lipoma.  It has become progressively more uncomfortable.  She would like to pursue its excision.  Past Medical History History reviewed. No pertinent past medical history.    Past Surgical History:  Procedure Laterality Date   BARIATRIC SURGERY N/A 01/02/2020   CERVICAL DISC SURGERY N/A    EYE SURGERY     SHOULDER SURGERY Left    TONSILLECTOMY N/A    TOTAL HIP ARTHROPLASTY Right 10/2020    Allergies  Allergen Reactions   Solifenacin Rash    Patient had bad rash with this med. Patient had bad rash with this med. Patient had bad rash with this med. Patient had bad rash with this med.     Current Outpatient Medications  Medication Sig Dispense Refill   acetaminophen (TYLENOL) 500 MG tablet Take 1 tablet by mouth every 8 hours for 3 days, then take every 8 hours as needed for mild pain     cetirizine (ZYRTEC) 10 MG tablet Take by mouth.     citalopram (CELEXA) 20 MG tablet Take 1 tablet (20 mg total) by mouth daily. 90 tablet 4   ergocalciferol (VITAMIN D2) 1.25 MG (50000 UT) capsule Take 1 capsule (50,000 Units total) by mouth once a week. 12 capsule 5   fluticasone (FLONASE) 50 MCG/ACT nasal spray Place into the nose.     levothyroxine (SYNTHROID) 137 MCG tablet Take 1 tablet (137 mcg total) by mouth daily before breakfast. 90 tablet 4   lipase/protease/amylase (CREON) 12000-38000 units CPEP capsule Take 1 Units by mouth 3 (three) times daily before meals.     [START ON 08/31/2021] Zoster Vaccine Adjuvanted [facility]) injection Inject 0.5 mLs into the muscle once for 1 dose. Dose # 2 0.5 mL 0   No current facility-administered medications for this visit.    Family History Family  History  Problem Relation Age of Onset   Thyroid disease Mother    Cancer - Lung Father       Social History Social History   Tobacco Use   Smoking status: Never   Smokeless tobacco: Never  Vaping Use   Vaping Use: Never used  Substance Use Topics   Alcohol use: Never   Drug use: Never        Review of Systems  Constitutional: Negative.   HENT: Negative.    Eyes: Negative.   Respiratory: Negative.    Cardiovascular: Negative.   Gastrointestinal: Negative.   Genitourinary: Negative.   Skin: Negative.   Neurological: Negative.   Psychiatric/Behavioral: Negative.       Physical Exam Blood pressure (!) 96/59, pulse (!) 51, temperature 98.3 F (36.8 C), temperature source Oral, height '5\' 7"'$  (1.702 m), weight 156 lb 6.4 oz (70.9 kg), SpO2 99 %. Last Weight  Most recent update: 03/17/2021  9:47 AM    Weight  70.9 kg (156 lb 6.4 oz)             CONSTITUTIONAL: Well developed, and nourished, appropriately responsive and aware without distress.   EYES: Sclera non-icteric.   EARS, NOSE, MOUTH AND THROAT: Mask worn.    Hearing is intact to voice.  NECK: Trachea is midline, and there is no jugular venous distension.  LYMPH NODES:  Lymph nodes in the neck are not enlarged. RESPIRATORY:  Lungs are clear, and breath sounds are equal bilaterally. Normal respiratory effort without pathologic use of accessory muscles. CARDIOVASCULAR: Heart is regular in rate and rhythm. GI: The abdomen is soft, nontender, and nondistended. There were no palpable masses. I did not appreciate hepatosplenomegaly. There were normal bowel sounds. MUSCULOSKELETAL:  Symmetrical muscle tone appreciated in all four extremities.    SKIN: Skin turgor is normal. No pathologic skin lesions appreciated.  Over the left lateral hip area there is a 6 inch soft supple multilobular mass consistent with mature lipoma.  There appears to be no involvement of the overlying dermis. NEUROLOGIC:  Motor and sensation  appear grossly normal.  Cranial nerves are grossly without defect. PSYCH:  Alert and oriented to person, place and time. Affect is appropriate for situation.  Data Reviewed I have personally reviewed what is currently available of the patient's imaging, recent labs and medical records.   Labs:  CBC Latest Ref Rng & Units 02/28/2021  WBC 3.4 - 10.8 x10E3/uL 4.7  Hemoglobin 11.1 - 15.9 g/dL 10.6(L)  Hematocrit 34.0 - 46.6 % 31.9(L)  Platelets 150 - 450 x10E3/uL 170   CMP Latest Ref Rng & Units 02/28/2021  Glucose 65 - 99 mg/dL 76  BUN 8 - 27 mg/dL 16  Creatinine 0.57 - 1.00 mg/dL 0.63  Sodium 134 - 144 mmol/L 143  Potassium 3.5 - 5.2 mmol/L 3.6  Chloride 96 - 106 mmol/L 105  CO2 20 - 29 mmol/L 23  Calcium 8.7 - 10.3 mg/dL 9.3  Total Protein 6.0 - 8.5 g/dL 6.0  Total Bilirubin 0.0 - 1.2 mg/dL 0.6  Alkaline Phos 44 - 121 IU/L 132(H)  AST 0 - 40 IU/L 50(H)  ALT 0 - 32 IU/L 34(H)      Imaging:  Within last 24 hrs: No results found.  Assessment    Left lateral thigh mass, likely benign lipoma. 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 Risk and benefits above procedure been discussed in detail including but not limited to anesthesia, bleeding, infection, recurrence etc.  I believe she understands, had her questions adequately addressed and desires to proceed.  No guarantees were ever expressed or implied.  Face-to-face time spent with the patient and accompanying care providers(if present) was 25 minutes, with more than 50% of the time spent counseling, educating, and coordinating care of the patient.    These  notes generated with voice recognition software. I apologize for typographical errors.  Ronny Bacon M.D., FACS 03/17/2021, 11:15 AM

## 2021-03-17 NOTE — Telephone Encounter (Signed)
Patient has been advised of Pre-Admission date/time, COVID Testing date and Surgery date.  Surgery Date: 04/13/21 Preadmission Testing Date: 04/04/21 (phone 1p-5p) Covid Testing Date: Not needed.     Patient has been made aware to call [phone], between 1-3:00pm the day before surgery, to find out what time to arrive for surgery.    

## 2021-03-17 NOTE — H&P (View-Only) (Signed)
Patient ID: [name] Robinson, female   DOB: 1950-11-28, 70 y.o.   MRN: [id number]  Chief Complaint: Left thigh mass x2 years  History of Present Illness [name] Robinson is a 70 y.o. female with an increasing left lateral thigh mass for the last 2 years.  Ultrasound evaluated at 5 cm and consistent with lipoma.  It has become progressively more uncomfortable.  She would like to pursue its excision.  Past Medical History History reviewed. No pertinent past medical history.    Past Surgical History:  Procedure Laterality Date   BARIATRIC SURGERY N/A 01/02/2020   CERVICAL DISC SURGERY N/A    EYE SURGERY     SHOULDER SURGERY Left    TONSILLECTOMY N/A    TOTAL HIP ARTHROPLASTY Right 10/2020    Allergies  Allergen Reactions   Solifenacin Rash    Patient had bad rash with this med. Patient had bad rash with this med. Patient had bad rash with this med. Patient had bad rash with this med.     Current Outpatient Medications  Medication Sig Dispense Refill   acetaminophen (TYLENOL) 500 MG tablet Take 1 tablet by mouth every 8 hours for 3 days, then take every 8 hours as needed for mild pain     cetirizine (ZYRTEC) 10 MG tablet Take by mouth.     citalopram (CELEXA) 20 MG tablet Take 1 tablet (20 mg total) by mouth daily. 90 tablet 4   ergocalciferol (VITAMIN D2) 1.25 MG (50000 UT) capsule Take 1 capsule (50,000 Units total) by mouth once a week. 12 capsule 5   fluticasone (FLONASE) 50 MCG/ACT nasal spray Place into the nose.     levothyroxine (SYNTHROID) 137 MCG tablet Take 1 tablet (137 mcg total) by mouth daily before breakfast. 90 tablet 4   lipase/protease/amylase (CREON) 12000-38000 units CPEP capsule Take 1 Units by mouth 3 (three) times daily before meals.     [START ON 08/31/2021] Zoster Vaccine Adjuvanted [facility]) injection Inject 0.5 mLs into the muscle once for 1 dose. Dose # 2 0.5 mL 0   No current facility-administered medications for this visit.    Family History Family  History  Problem Relation Age of Onset   Thyroid disease Mother    Cancer - Lung Father       Social History Social History   Tobacco Use   Smoking status: Never   Smokeless tobacco: Never  Vaping Use   Vaping Use: Never used  Substance Use Topics   Alcohol use: Never   Drug use: Never        Review of Systems  Constitutional: Negative.   HENT: Negative.    Eyes: Negative.   Respiratory: Negative.    Cardiovascular: Negative.   Gastrointestinal: Negative.   Genitourinary: Negative.   Skin: Negative.   Neurological: Negative.   Psychiatric/Behavioral: Negative.       Physical Exam Blood pressure (!) 96/59, pulse (!) 51, temperature 98.3 F (36.8 C), temperature source Oral, height '5\' 7"'$  (1.702 m), weight 156 lb 6.4 oz (70.9 kg), SpO2 99 %. Last Weight  Most recent update: 03/17/2021  9:47 AM    Weight  70.9 kg (156 lb 6.4 oz)             CONSTITUTIONAL: Well developed, and nourished, appropriately responsive and aware without distress.   EYES: Sclera non-icteric.   EARS, NOSE, MOUTH AND THROAT: Mask worn.    Hearing is intact to voice.  NECK: Trachea is midline, and there is no jugular venous distension.  LYMPH NODES:  Lymph nodes in the neck are not enlarged. RESPIRATORY:  Lungs are clear, and breath sounds are equal bilaterally. Normal respiratory effort without pathologic use of accessory muscles. CARDIOVASCULAR: Heart is regular in rate and rhythm. GI: The abdomen is soft, nontender, and nondistended. There were no palpable masses. I did not appreciate hepatosplenomegaly. There were normal bowel sounds. MUSCULOSKELETAL:  Symmetrical muscle tone appreciated in all four extremities.    SKIN: Skin turgor is normal. No pathologic skin lesions appreciated.  Over the left lateral hip area there is a 6 inch soft supple multilobular mass consistent with mature lipoma.  There appears to be no involvement of the overlying dermis. NEUROLOGIC:  Motor and sensation  appear grossly normal.  Cranial nerves are grossly without defect. PSYCH:  Alert and oriented to person, place and time. Affect is appropriate for situation.  Data Reviewed I have personally reviewed what is currently available of the patient's imaging, recent labs and medical records.   Labs:  CBC Latest Ref Rng & Units 02/28/2021  WBC 3.4 - 10.8 x10E3/uL 4.7  Hemoglobin 11.1 - 15.9 g/dL 10.6(L)  Hematocrit 34.0 - 46.6 % 31.9(L)  Platelets 150 - 450 x10E3/uL 170   CMP Latest Ref Rng & Units 02/28/2021  Glucose 65 - 99 mg/dL 76  BUN 8 - 27 mg/dL 16  Creatinine 0.57 - 1.00 mg/dL 0.63  Sodium 134 - 144 mmol/L 143  Potassium 3.5 - 5.2 mmol/L 3.6  Chloride 96 - 106 mmol/L 105  CO2 20 - 29 mmol/L 23  Calcium 8.7 - 10.3 mg/dL 9.3  Total Protein 6.0 - 8.5 g/dL 6.0  Total Bilirubin 0.0 - 1.2 mg/dL 0.6  Alkaline Phos 44 - 121 IU/L 132(H)  AST 0 - 40 IU/L 50(H)  ALT 0 - 32 IU/L 34(H)      Imaging:  Within last 24 hrs: No results found.  Assessment    Left lateral thigh mass, likely benign lipoma. 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 Risk and benefits above procedure been discussed in detail including but not limited to anesthesia, bleeding, infection, recurrence etc.  I believe she understands, had her questions adequately addressed and desires to proceed.  No guarantees were ever expressed or implied.  Face-to-face time spent with the patient and accompanying care providers(if present) was 25 minutes, with more than 50% of the time spent counseling, educating, and coordinating care of the patient.    These  notes generated with voice recognition software. I apologize for typographical errors.  Ronny Bacon M.D., FACS 03/17/2021, 11:15 AM

## 2021-03-28 ENCOUNTER — Ambulatory Visit: Admission: RE | Admit: 2021-03-28 | Payer: Medicare Other | Source: Ambulatory Visit

## 2021-03-28 ENCOUNTER — Other Ambulatory Visit: Payer: Medicare Other

## 2021-04-04 ENCOUNTER — Encounter
Admission: RE | Admit: 2021-04-04 | Discharge: 2021-04-04 | Disposition: A | Discharge status: Home / Self Care | Payer: Medicare Other | Source: Ambulatory Visit | Attending: Surgery | Admitting: Surgery

## 2021-04-04 ENCOUNTER — Other Ambulatory Visit: Payer: Self-pay

## 2021-04-04 HISTORY — DX: Morbid (severe) obesity due to excess calories: E66.01

## 2021-04-04 HISTORY — DX: Hypothyroidism, unspecified: E03.9

## 2021-04-04 HISTORY — DX: Sleep apnea, unspecified: G47.30

## 2021-04-04 NOTE — Patient Instructions (Addendum)
Your procedure is scheduled on: Wednesday, August 31 Report to the Registration Desk on the 1st floor of the [facility]. To find out your arrival time, please call [phone] between 1PM - 3PM on: Tuesday, August 30  REMEMBER: Instructions that are not followed completely may result in serious medical risk, up to and including death; or upon the discretion of your surgeon and anesthesiologist your surgery may need to be rescheduled.  Do not eat food after midnight the night before surgery.  No gum chewing, lozengers or hard candies.  You may however, drink CLEAR liquids up to 2 hours before you are scheduled to arrive for your surgery. Do not drink anything within 2 hours of your scheduled arrival time.  Clear liquids include: - water  - apple juice without pulp - gatorade (not RED, PURPLE, OR BLUE) - black coffee or tea (Do NOT add milk or creamers to the coffee or tea) Do NOT drink anything that is not on this list.  TAKE THESE MEDICATIONS THE MORNING OF SURGERY WITH A SIP OF WATER:  Citalopram (Celexa) Levothyroxine (Synthroid)  One week prior to surgery: starting August 24 Stop Anti-inflammatories (NSAIDS) such as Advil, Aleve, Ibuprofen, Motrin, Naproxen, Naprosyn and Aspirin based products such as Excedrin, Goodys Powder, BC Powder. Stop ANY OVER THE COUNTER supplements until after surgery. You may however, continue to take Tylenol if needed for pain up until the day of surgery.  No Alcohol for 24 hours before or after surgery.  No Smoking including e-cigarettes for 24 hours prior to surgery.   On the morning of surgery brush your teeth with toothpaste and water, you may rinse your mouth with mouthwash if you wish. Do not swallow any toothpaste or mouthwash.  Do not wear jewelry, make-up, hairpins, clips or nail polish.  Do not wear lotions, powders, or perfumes.   Do not shave body from the neck down 48 hours prior to surgery just in case you cut yourself which  could leave a site for infection.  Also, freshly shaved skin may become irritated if using the CHG soap.  Contact lenses, hearing aids and dentures may not be worn into surgery.  Do not bring valuables to the hospital. The [facility] is not responsible for any missing/lost belongings or valuables.   Use CHG Soap as directed on instruction sheet.  Notify your doctor if there is any change in your medical condition (cold, fever, infection).  Wear comfortable clothing (specific to your surgery type) to the hospital.  After surgery, you can help prevent lung complications by doing breathing exercises.  Take deep breaths and cough every 1-2 hours. Your doctor may order a device called an Incentive Spirometer to help you take deep breaths.  If you are being discharged the day of surgery, you will not be allowed to drive home. You will need a responsible adult (18 years or older) to drive you home and stay with you that night.   If you are taking public transportation, you will need to have a responsible adult (18 years or older) with you. Please confirm with your physician that it is acceptable to use public transportation.   Please call the [place] [facility] Dept. at [phone] if you have any questions about these instructions.  Surgery Visitation Policy:  Patients undergoing a surgery or procedure may have one family member or support person with them as long as that person is not COVID-19 positive or experiencing its symptoms.  That person may remain in the waiting  area during the procedure.

## 2021-04-07 ENCOUNTER — Encounter
Admission: RE | Admit: 2021-04-07 | Discharge: 2021-04-07 | Disposition: A | Discharge status: Home / Self Care | Payer: Medicare Other | Source: Ambulatory Visit | Attending: Surgery | Admitting: Surgery

## 2021-04-07 ENCOUNTER — Other Ambulatory Visit: Payer: Self-pay

## 2021-04-07 DIAGNOSIS — Z01812 Encounter for preprocedural laboratory examination: Secondary | ICD-10-CM | POA: Insufficient documentation

## 2021-04-07 DIAGNOSIS — Z01810 Encounter for preprocedural cardiovascular examination: Secondary | ICD-10-CM | POA: Insufficient documentation

## 2021-04-12 MED ORDER — CHLORHEXIDINE GLUCONATE CLOTH 2 % EX PADS: 6.0000 | MEDICATED_PAD | Freq: Once | CUTANEOUS | Status: DC

## 2021-04-12 MED ORDER — ORAL CARE MOUTH RINSE: 15.0000 mL | Freq: Once | OROMUCOSAL | Status: AC

## 2021-04-12 MED ORDER — LACTATED RINGERS IV SOLN: INTRAVENOUS | Status: DC

## 2021-04-12 MED ORDER — CHLORHEXIDINE GLUCONATE 0.12 % MT SOLN: 15.0000 mL | Freq: Once | OROMUCOSAL | Status: AC

## 2021-04-12 MED ORDER — FAMOTIDINE 20 MG PO TABS: 20.0000 mg | ORAL_TABLET | Freq: Once | ORAL | Status: AC

## 2021-04-12 MED ORDER — CEFAZOLIN SODIUM-DEXTROSE 2-4 GM/100ML-% IV SOLN
2.0000 g | INTRAVENOUS | Status: AC
  Administered 2021-04-13: 2 g via INTRAVENOUS

## 2021-04-12 MED ORDER — ACETAMINOPHEN 500 MG PO TABS: 1000.0000 mg | ORAL_TABLET | ORAL | Status: AC

## 2021-04-13 ENCOUNTER — Ambulatory Visit
Admission: RE | Admit: 2021-04-13 | Discharge: 2021-04-13 | Disposition: A | Discharge status: Home / Self Care | Payer: Medicare Other | Attending: Surgery | Admitting: Surgery

## 2021-04-13 ENCOUNTER — Encounter: Payer: Self-pay | Admitting: Surgery

## 2021-04-13 ENCOUNTER — Ambulatory Visit: Payer: Medicare Other | Admitting: Certified Registered"

## 2021-04-13 ENCOUNTER — Encounter
Admission: RE | Disposition: A | Discharge status: Home / Self Care | Payer: Self-pay | Source: Home / Self Care | Attending: Surgery

## 2021-04-13 DIAGNOSIS — Z79899 Other long term (current) drug therapy: Secondary | ICD-10-CM | POA: Diagnosis not present

## 2021-04-13 DIAGNOSIS — Z79890 Hormone replacement therapy (postmenopausal): Secondary | ICD-10-CM | POA: Insufficient documentation

## 2021-04-13 DIAGNOSIS — Z9884 Bariatric surgery status: Secondary | ICD-10-CM | POA: Insufficient documentation

## 2021-04-13 DIAGNOSIS — Z96641 Presence of right artificial hip joint: Secondary | ICD-10-CM | POA: Insufficient documentation

## 2021-04-13 DIAGNOSIS — Z888 Allergy status to other drugs, medicaments and biological substances status: Secondary | ICD-10-CM | POA: Diagnosis not present

## 2021-04-13 DIAGNOSIS — D1724 Benign lipomatous neoplasm of skin and subcutaneous tissue of left leg: Secondary | ICD-10-CM

## 2021-04-13 DIAGNOSIS — R2242 Localized swelling, mass and lump, left lower limb: Secondary | ICD-10-CM | POA: Diagnosis present

## 2021-04-13 HISTORY — PX: MASS EXCISION: SHX2000

## 2021-04-13 SURGERY — EXCISION MASS
Anesthesia: Monitor Anesthesia Care | Site: Thigh | Laterality: Left

## 2021-04-13 MED ORDER — GLYCOPYRROLATE 0.2 MG/ML IJ SOLN
INTRAMUSCULAR | Status: AC
  Filled 2021-04-13: qty 1

## 2021-04-13 MED ORDER — 0.9 % SODIUM CHLORIDE (POUR BTL) OPTIME
TOPICAL | Status: DC | PRN
  Administered 2021-04-13: 500 mL

## 2021-04-13 MED ORDER — FENTANYL CITRATE (PF) 100 MCG/2ML IJ SOLN
INTRAMUSCULAR | Status: DC | PRN
  Administered 2021-04-13: 25 ug via INTRAVENOUS
  Administered 2021-04-13: 50 ug via INTRAVENOUS
  Administered 2021-04-13: 25 ug via INTRAVENOUS

## 2021-04-13 MED ORDER — MIDAZOLAM HCL 2 MG/2ML IJ SOLN
INTRAMUSCULAR | Status: DC | PRN
  Administered 2021-04-13: 2 mg via INTRAVENOUS

## 2021-04-13 MED ORDER — EPHEDRINE 5 MG/ML INJ
INTRAVENOUS | Status: AC
  Filled 2021-04-13: qty 5

## 2021-04-13 MED ORDER — PROPOFOL 10 MG/ML IV BOLUS
INTRAVENOUS | Status: AC
  Filled 2021-04-13: qty 20

## 2021-04-13 MED ORDER — LIDOCAINE HCL (PF) 1 % IJ SOLN
INTRAMUSCULAR | Status: DC | PRN
  Administered 2021-04-13: 20 mL

## 2021-04-13 MED ORDER — FENTANYL CITRATE (PF) 100 MCG/2ML IJ SOLN
INTRAMUSCULAR | Status: AC
  Filled 2021-04-13: qty 2

## 2021-04-13 MED ORDER — BUPIVACAINE-EPINEPHRINE (PF) 0.25% -1:200000 IJ SOLN
INTRAMUSCULAR | Status: AC
  Filled 2021-04-13: qty 30

## 2021-04-13 MED ORDER — FAMOTIDINE 20 MG PO TABS
ORAL_TABLET | ORAL | Status: AC
  Administered 2021-04-13: 20 mg via ORAL
  Filled 2021-04-13: qty 1

## 2021-04-13 MED ORDER — FENTANYL CITRATE (PF) 100 MCG/2ML IJ SOLN: 25.0000 ug | INTRAMUSCULAR | Status: DC | PRN

## 2021-04-13 MED ORDER — CEFAZOLIN SODIUM-DEXTROSE 1-4 GM/50ML-% IV SOLN
INTRAVENOUS | Status: AC
  Filled 2021-04-13: qty 50

## 2021-04-13 MED ORDER — PHENYLEPHRINE HCL (PRESSORS) 10 MG/ML IV SOLN
INTRAVENOUS | Status: AC
  Filled 2021-04-13: qty 1

## 2021-04-13 MED ORDER — GLYCOPYRROLATE 0.2 MG/ML IJ SOLN
INTRAMUSCULAR | Status: DC | PRN
Start: 2021-04-13 — End: 2021-04-13
  Administered 2021-04-13: .2 mg via INTRAVENOUS

## 2021-04-13 MED ORDER — ONDANSETRON HCL 4 MG/2ML IJ SOLN: 4.0000 mg | Freq: Once | INTRAMUSCULAR | Status: DC | PRN

## 2021-04-13 MED ORDER — ACETAMINOPHEN 500 MG PO TABS
ORAL_TABLET | ORAL | Status: AC
  Administered 2021-04-13: 1000 mg via ORAL
  Filled 2021-04-13: qty 2

## 2021-04-13 MED ORDER — CHLORHEXIDINE GLUCONATE 0.12 % MT SOLN
OROMUCOSAL | Status: AC
  Administered 2021-04-13: 15 mL via OROMUCOSAL
  Filled 2021-04-13: qty 15

## 2021-04-13 MED ORDER — EPHEDRINE SULFATE 50 MG/ML IJ SOLN
INTRAMUSCULAR | Status: DC | PRN
  Administered 2021-04-13: 10 mg via INTRAVENOUS

## 2021-04-13 MED ORDER — LIDOCAINE HCL (PF) 1 % IJ SOLN
INTRAMUSCULAR | Status: AC
  Filled 2021-04-13: qty 30

## 2021-04-13 MED ORDER — HYDROCODONE-ACETAMINOPHEN 5-325 MG PO TABS: 1.0000 | ORAL_TABLET | Freq: Four times a day (QID) | ORAL | 0 refills | Status: DC | PRN

## 2021-04-13 MED ORDER — IBUPROFEN 800 MG PO TABS: 800.0000 mg | ORAL_TABLET | Freq: Three times a day (TID) | ORAL | 0 refills | Status: DC | PRN

## 2021-04-13 MED ORDER — MIDAZOLAM HCL 2 MG/2ML IJ SOLN
INTRAMUSCULAR | Status: AC
  Filled 2021-04-13: qty 2

## 2021-04-13 MED ORDER — PROPOFOL 500 MG/50ML IV EMUL
INTRAVENOUS | Status: DC | PRN
  Administered 2021-04-13: 20 mg via INTRAVENOUS
  Administered 2021-04-13: 40 mg via INTRAVENOUS
  Administered 2021-04-13: 30 mg via INTRAVENOUS
  Administered 2021-04-13: 40 mg via INTRAVENOUS
  Administered 2021-04-13: 100 mg via INTRAVENOUS
  Administered 2021-04-13: 10 mg via INTRAVENOUS

## 2021-04-13 MED ORDER — LIDOCAINE HCL (CARDIAC) PF 100 MG/5ML IV SOSY
PREFILLED_SYRINGE | INTRAVENOUS | Status: DC | PRN
  Administered 2021-04-13: 60 mg via INTRAVENOUS

## 2021-04-13 SURGICAL SUPPLY — 28 items
BLADE SURG 15 STRL LF DISP TIS (BLADE) ×1 IMPLANT
BLADE SURG 15 STRL SS (BLADE) ×1
CHLORAPREP W/TINT 26 (MISCELLANEOUS) ×2 IMPLANT
DERMABOND ADVANCED (GAUZE/BANDAGES/DRESSINGS) ×2
DERMABOND ADVANCED .7 DNX12 (GAUZE/BANDAGES/DRESSINGS) ×1 IMPLANT
DRAPE LAPAROTOMY 77X122 PED (DRAPES) ×2 IMPLANT
DRSG GAUZE FLUFF 36X18 (GAUZE/BANDAGES/DRESSINGS) ×1 IMPLANT
ELECT CAUTERY BLADE TIP 2.5 (TIP) ×2
ELECT REM PT RETURN 9FT ADLT (ELECTROSURGICAL) ×2
ELECTRODE CAUTERY BLDE TIP 2.5 (TIP) ×1 IMPLANT
ELECTRODE REM PT RTRN 9FT ADLT (ELECTROSURGICAL) ×1 IMPLANT
GAUZE 4X4 16PLY ~~LOC~~+RFID DBL (SPONGE) ×2 IMPLANT
GLOVE SURG ORTHO LTX SZ7.5 (GLOVE) ×2 IMPLANT
GOWN STRL REUS W/ TWL LRG LVL3 (GOWN DISPOSABLE) ×2 IMPLANT
GOWN STRL REUS W/TWL LRG LVL3 (GOWN DISPOSABLE) ×2
KIT TURNOVER KIT A (KITS) ×2 IMPLANT
MANIFOLD NEPTUNE II (INSTRUMENTS) ×2 IMPLANT
NEEDLE HYPO 22GX1.5 SAFETY (NEEDLE) ×2 IMPLANT
NS IRRIG 500ML POUR BTL (IV SOLUTION) ×1 IMPLANT
PACK BASIN MINOR ARMC (MISCELLANEOUS) ×2 IMPLANT
SUT MNCRL 4-0 (SUTURE) ×1
SUT MNCRL 4-0 27XMFL (SUTURE) ×1
SUT VIC AB 3-0 SH 27 (SUTURE) ×1
SUT VIC AB 3-0 SH 27X BRD (SUTURE) ×1 IMPLANT
SUTURE MNCRL 4-0 27XMF (SUTURE) ×1 IMPLANT
SYR 10ML LL (SYRINGE) ×2 IMPLANT
SYR BULB IRRIG 60ML STRL (SYRINGE) ×2 IMPLANT
WATER STERILE IRR 500ML POUR (IV SOLUTION) ×1 IMPLANT

## 2021-04-13 NOTE — Op Note (Signed)
Excision 15cm Left Lateral Thigh Soft Tissue Mass  Pre-operative Diagnosis: Left lateral soft tissue mass, c/w lipoma.    Post-operative Diagnosis: same.    Surgeon: Ronny Bacon, M.D., FACS  Anesthesia: Local w/ MAC    Findings: Multilobular lipomatous tissue.   Estimated Blood Loss: 5 mL         Specimens: c/w mature lipoma           Complications: none              Procedure Details  The patient was seen again in the Holding Room. The benefits, complications, treatment options, and expected outcomes were discussed with the patient. The risks of bleeding, infection, recurrence of symptoms, failure to resolve symptoms, unanticipated injury, prosthetic placement, prosthetic infection, any of which could require further surgery were reviewed with the patient. The likelihood of improving the patient's symptoms with return to their baseline status is good.  The patient and/or family concurred with the proposed plan, giving informed consent.  The patient was taken to Operating Room, identified and the procedure verified.    Prior to the induction of general anesthesia, antibiotic prophylaxis was administered. VTE prophylaxis was in place. MAC was then administered and tolerated well. After the induction, the patient was positioned in the supine position and the left lateral thigh region was prepped with Chloraprep and draped in the sterile fashion.  A Time Out was held and the above information confirmed.  The border of the lipomatous mass was outlined, the region was then infiltrated with a one-to-one mix of local anesthetic of 1% lidocaine with quarter percent Marcaine with epinephrine.  Vertical incision was made over the central most part of the mass approximately 5 cm. I then proceeded with continuing the incision down to the lipomatous mass and multiple lipomatous lobules were removed.  The most central 1 was the largest and subsequent additional ones were identified along the  perimeter until I was confident we removed all the abnormal tissue that concerned and bothered the patient. Hemostasis was confirmed with electrocautery.  Her wound was then irrigated with saline.  Reevaluated for hemostasis.  There was a posterior flap that may warrant drainage but I will utilize external compression. Proceeded with closing the incision with interrupted 3-0 Vicryl dermals, followed by a 4-0 Monocryl subcuticular.  I then sealed the incision with Dermabond.  We then applied fluffs and a compressive dressing utilizing benzoin to protect the skin. She was then transferred to recovery room in stable condition.  She tolerated the procedure well.      Ronny Bacon M.D., [facility] 04/13/2021 8:26 AM

## 2021-04-13 NOTE — Transfer of Care (Signed)
Immediate Anesthesia Transfer of Care Note  Patient: [name] Robinson  Procedure(s) Performed: EXCISION MASS, left lateral thigh (Left: Thigh)  Patient Location: PACU  Anesthesia Type:MAC  Level of Consciousness: awake, alert  and oriented  Airway & Oxygen Therapy: Patient Spontanous Breathing  Post-op Assessment: Report given to RN and Post -op Vital signs reviewed and stable  Post vital signs: Reviewed and stable  Last Vitals:  Vitals Value Taken Time  BP 100/56 04/13/21 0821  Temp    Pulse 60 04/13/21 0823  Resp 13 04/13/21 0823  SpO2 98 % 04/13/21 0823  Vitals shown include unvalidated device data.  Last Pain:  Vitals:   04/13/21 0631  PainSc: 1          Complications: No notable events documented.

## 2021-04-13 NOTE — Interval H&P Note (Signed)
History and Physical Interval Note:  04/13/2021 7:22 AM  [name] Robinson  has presented today for surgery, with the diagnosis of left thigh mass.  The various methods of treatment have been discussed with the patient and family. After consideration of risks, benefits and other options for treatment, the patient has consented to  Procedure(s): EXCISION MASS, left lateral thigh (Left) as a surgical intervention.  The patient's history has been reviewed, patient examined, no change in status, stable for surgery.  I have reviewed the patient's chart and labs.  Questions were answered to the patient's satisfaction.   The left lateral thigh is marked.   Ronny Bacon

## 2021-04-13 NOTE — Anesthesia Preprocedure Evaluation (Signed)
Anesthesia Evaluation  Patient identified by MRN, date of birth, ID band Patient awake    Reviewed: Allergy & Precautions, H&P , NPO status , Patient's Chart, lab work & pertinent test results, reviewed documented beta blocker date and time   Airway Mallampati: II  TM Distance: >3 FB Neck ROM: full    Dental  (+) Teeth Intact   Pulmonary sleep apnea and Continuous Positive Airway Pressure Ventilation ,    Pulmonary exam normal        Cardiovascular Exercise Tolerance: Good negative cardio ROS Normal cardiovascular exam Rate:Normal     Neuro/Psych Anxiety negative neurological ROS  negative psych ROS   GI/Hepatic negative GI ROS, Neg liver ROS,   Endo/Other  Hypothyroidism   Renal/GU negative Renal ROS  negative genitourinary   Musculoskeletal   Abdominal   Peds  Hematology negative hematology ROS (+)   Anesthesia Other Findings   Reproductive/Obstetrics negative OB ROS                             Anesthesia Physical Anesthesia Plan  ASA: 2  Anesthesia Plan: MAC   Post-op Pain Management:    Induction:   PONV Risk Score and Plan:   Airway Management Planned:   Additional Equipment:   Intra-op Plan:   Post-operative Plan:   Informed Consent: I have reviewed the patients History and Physical, chart, labs and discussed the procedure including the risks, benefits and alternatives for the proposed anesthesia with the patient or authorized representative who has indicated his/her understanding and acceptance.       Plan Discussed with: CRNA  Anesthesia Plan Comments:         Anesthesia Quick Evaluation

## 2021-04-13 NOTE — Discharge Instructions (Signed)
AMBULATORY SURGERY  ?DISCHARGE INSTRUCTIONS ? ? ?The drugs that you were given will stay in your system until tomorrow so for the next 24 hours you should not: ? ?Drive an automobile ?Make any legal decisions ?Drink any alcoholic beverage ? ? ?You may resume regular meals tomorrow.  Today it is better to start with liquids and gradually work up to solid foods. ? ?You may eat anything you prefer, but it is better to start with liquids, then soup and crackers, and gradually work up to solid foods. ? ? ?Please notify your doctor immediately if you have any unusual bleeding, trouble breathing, redness and pain at the surgery site, drainage, fever, or pain not relieved by medication. ? ? ? ?Additional Instructions: ? ? ? ?Please contact your physician with any problems or Same Day Surgery at [phone], Monday through Friday 6 am to 4 pm, or [place] [facility] [place] [facility] number at [phone].  ?

## 2021-04-14 LAB — SURGICAL PATHOLOGY

## 2021-04-14 NOTE — Anesthesia Postprocedure Evaluation (Signed)
Anesthesia Post Note  Patient: [name] Robinson  Procedure(s) Performed: EXCISION MASS, left lateral thigh (Left: Thigh)  Patient location during evaluation: PACU Anesthesia Type: MAC Level of consciousness: awake and alert Pain management: pain level controlled Vital Signs Assessment: post-procedure vital signs reviewed and stable Respiratory status: spontaneous breathing, nonlabored ventilation, respiratory function stable and patient connected to nasal cannula oxygen Cardiovascular status: blood pressure returned to baseline and stable Postop Assessment: no apparent nausea or vomiting Anesthetic complications: no   No notable events documented.   Last Vitals:  Vitals:   04/13/21 0849 04/13/21 0904  BP: (!) 96/58 (!) 107/57  Pulse: (!) 55 (!) 55  Resp: 14 18  Temp: (!) 36.2 C (!) 36.2 C  SpO2: 95% 99%    Last Pain:  Vitals:   04/13/21 0904  PainSc: 0-No pain                 Molli Barrows

## 2021-04-20 ENCOUNTER — Ambulatory Visit (INDEPENDENT_AMBULATORY_CARE_PROVIDER_SITE_OTHER): Payer: Medicare Other | Admitting: Nurse Practitioner

## 2021-04-20 ENCOUNTER — Encounter: Payer: Self-pay | Admitting: Nurse Practitioner

## 2021-04-20 ENCOUNTER — Other Ambulatory Visit: Payer: Self-pay

## 2021-04-20 VITALS — BP 104/63 | HR 75 | Temp 98.7°F | Wt 171.0 lb

## 2021-04-20 DIAGNOSIS — R001 Bradycardia, unspecified: Secondary | ICD-10-CM | POA: Diagnosis not present

## 2021-04-20 DIAGNOSIS — M7989 Other specified soft tissue disorders: Secondary | ICD-10-CM | POA: Insufficient documentation

## 2021-04-20 MED ORDER — FUROSEMIDE 20 MG PO TABS: 20.0000 mg | ORAL_TABLET | Freq: Every day | ORAL | 0 refills | Status: DC

## 2021-04-20 NOTE — Progress Notes (Signed)
BP 104/63   Pulse 75   Temp 98.7 F (37.1 C) (Oral)   Wt 171 lb (77.6 kg)   SpO2 96%   BMI 26.78 kg/m    Subjective:    Patient ID: [name] Robinson, female    DOB: 09-Aug-1951, 70 y.o.   MRN: [id number]  HPI: [name] Robinson is a 70 y.o. female  Chief Complaint  Patient presents with   low heart rate   Foot Swelling   Leg Swelling    Patient states she recently traveled to [place] and she noticed the swelling in legs and ankles. Patient states the pain in her foot is a result of her having the swelling in her lower extremities. Patient states nothing helps with her swelling and states she recently had surgery and that is when she noticed the low heart rate.    Foot Pain   stool softener    Patient states she had a small bowel resection recently and has not had a bowel movement since she left the hospital and is wondering if she can take stool softeners and if so how many.    SWELLING Patient states she went to [place] on a plane and she didn't walk a lot.  Patient traveled to [facility] August 17-24th.  That is when she first noticed the swelling in both legs, left one is worse.  Patient states the swelling is causing ankle and calf pain on the left side. Patient states she has tried to elevate her feet and drink a lot of water which hasn't helped her symptoms.  LOW HEART RATE Patient states her HR has been low.  She had laparoscopic surgery on 9/2 and was kept in the hospital for 3 days due to her HR being in the 30s.  Cardiologist gave her the all clear in the hospital but she would like to see one outpatient.  Denies HA, CP, SOB, dizziness, palpitations, and visual changes.   Patient states she has had a bowel movement in the hospital but did not have one yesterday. She would like to know if she can take a stool softener.  Has not followed up with her surgeon.     Relevant past medical, surgical, family and social history reviewed and updated as indicated. Interim medical  history since our last visit reviewed. Allergies and medications reviewed and updated.  Review of Systems  Eyes:  Negative for visual disturbance.  Respiratory:  Negative for cough, chest tightness and shortness of breath.   Cardiovascular:  Positive for leg swelling. Negative for chest pain and palpitations.  Neurological:  Negative for dizziness and headaches.   Per HPI unless specifically indicated above     Objective:    BP 104/63   Pulse 75   Temp 98.7 F (37.1 C) (Oral)   Wt 171 lb (77.6 kg)   SpO2 96%   BMI 26.78 kg/m   Wt Readings from Last 3 Encounters:  04/20/21 171 lb (77.6 kg)  04/13/21 150 lb (68 kg)  04/04/21 150 lb (68 kg)    Physical Exam Vitals and nursing note reviewed.  Constitutional:      General: She is not in acute distress.    Appearance: Normal appearance. She is normal weight. She is not ill-appearing, toxic-appearing or diaphoretic.  HENT:     Head: Normocephalic.     Right Ear: External ear normal.     Left Ear: External ear normal.     Nose: Nose normal.     Mouth/Throat:  Mouth: Mucous membranes are moist.     Pharynx: Oropharynx is clear.  Eyes:     General:        Right eye: No discharge.        Left eye: No discharge.     Extraocular Movements: Extraocular movements intact.     Conjunctiva/sclera: Conjunctivae normal.     Pupils: Pupils are equal, round, and reactive to light.  Cardiovascular:     Rate and Rhythm: Normal rate and regular rhythm.     Heart sounds: No murmur heard. Pulmonary:     Effort: Pulmonary effort is normal. No respiratory distress.     Breath sounds: Normal breath sounds. No wheezing or rales.  Abdominal:     General: Abdomen is flat. Bowel sounds are normal.     Palpations: Abdomen is soft.  Musculoskeletal:     Cervical back: Normal range of motion and neck supple.     Right lower leg: 2+ Edema present.     Left lower leg: 2+ Edema present.  Skin:    General: Skin is warm and dry.     Capillary  Refill: Capillary refill takes less than 2 seconds.  Neurological:     General: No focal deficit present.     Mental Status: She is alert and oriented to person, place, and time. Mental status is at baseline.  Psychiatric:        Mood and Affect: Mood normal.        Behavior: Behavior normal.        Thought Content: Thought content normal.        Judgment: Judgment normal.    Results for orders placed or performed during the hospital encounter of 04/13/21  Surgical pathology  Result Value Ref Range   SURGICAL PATHOLOGY      SURGICAL PATHOLOGY CASE: [id number] PATIENT: [name] Robinson Surgical Pathology Report     Specimen Submitted: A. Lipoma, left lateral thigh  Clinical History: Left thigh mass      DIAGNOSIS: A. SOFT TISSUE, LEFT LATERAL THIGH; EXCISION: - MATURE ADIPOSE TISSUE WITHOUT ATYPIA, COMPATIBLE WITH LIPOMA. - NEGATIVE FOR MALIGNANCY.   GROSS DESCRIPTION: A. Labeled: Left lateral thigh lipoma Received: Formalin Collection time: 7:56 AM on 04/13/2021 Placed into formalin time: 8:05 AM on 04/13/2021 Tissue fragment(s): Multiple Size: Aggregate, 6.3 x 6.2 x 2.5 cm Description: Received are fragments of yellow lobulated adipose tissue. Sectioning reveals yellow lobulated adipose tissue admixed with tan-white fibrous tissue.  No distinct masses or lesions are grossly identified. Representative sections (1/cm) are submitted in cassettes 1-2.  RB 04/13/2021  Final Diagnosis performed by Betsy Pries, MD.   Electronically signed 04/14/2021 11:50:40AM The electron ic signature indicates that the named Attending Pathologist has evaluated the specimen Technical component performed at [facility], [address], [place], [place] [address] Lab: [phone] Dir: Rush Farmer, MD, MMM  Professional component performed at [facility], [facility], [place], [place], [place] [address] Lab: [phone] Dir: Dellia Nims. Reuel Derby, MD        Assessment & Plan:   Problem List Items Addressed This Visit       Other   Swelling of lower extremity    Lasix given for patient to take daily for two weeks. Referral placed for Cardiology for evaluation of swelling. Follow up in 1 month for reevaluation.  Recommend increasing walking, elevated feet when sitting and staying well hydrated to help with swelling.      Relevant Medications   furosemide (LASIX) 20 MG  tablet   Other Relevant Orders   Ambulatory referral to Cardiology   Other Visit Diagnoses     Bradycardia    -  Primary   Referral placed for patient to have outpatient Cardiology workup.  Will follow up in patient in 1 month for reevaluation.    Relevant Medications   furosemide (LASIX) 20 MG tablet   Other Relevant Orders   Ambulatory referral to Cardiology        Follow up plan: Return in about 1 month (around 05/20/2021) for Follow up on swelling.   A total of 30 minutes were spent on this encounter today.  When total time is documented, this includes both the face-to-face and non-face-to-face time personally spent before, during and after the visit on the date of the encounter reviewing plan of care including how to take lasix and referral to Cardiology.

## 2021-04-20 NOTE — Assessment & Plan Note (Signed)
Lasix given for patient to take daily for two weeks. Referral placed for Cardiology for evaluation of swelling. Follow up in 1 month for reevaluation.  Recommend increasing walking, elevated feet when sitting and staying well hydrated to help with swelling.

## 2021-04-26 ENCOUNTER — Encounter: Payer: Self-pay | Admitting: Surgery

## 2021-04-26 ENCOUNTER — Ambulatory Visit (INDEPENDENT_AMBULATORY_CARE_PROVIDER_SITE_OTHER): Payer: Medicare Other | Admitting: Surgery

## 2021-04-26 ENCOUNTER — Other Ambulatory Visit: Payer: Self-pay

## 2021-04-26 VITALS — BP 96/61 | HR 65 | Temp 98.1°F | Ht 67.0 in | Wt 156.2 lb

## 2021-04-26 DIAGNOSIS — D1724 Benign lipomatous neoplasm of skin and subcutaneous tissue of left leg: Secondary | ICD-10-CM

## 2021-04-26 NOTE — Progress Notes (Signed)
[facility] POST-OP OFFICE VISIT  04/26/2021  HPI: [name] Robinson is a 70 y.o. female 14 days s/p excision of left lateral hip lipoma.  Coincidentally I note that she had a small bowel resection at [facility] just a few days after my procedure with her.  She kept her compression dressing on her left hip throughout the 2 weeks and were removing it for the first time today.  Vital signs: BP 96/61   Pulse 65   Temp 98.1 F (36.7 C) (Oral)   Ht '5\' 7"'$  (1.702 m)   Wt 156 lb 3.2 oz (70.9 kg)   SpO2 98%   BMI 24.46 kg/m    Physical Exam: Constitutional: She appears well aside from her hemorrhagic sclera on her right eye.  Skin: Left lateral hip dressing is removed, expected incision appears to be clean dry and intact with diminished soft tissue present following her lipoma excision.  Assessment/Plan: This is a 70 y.o. female 14 days s/p left lateral thigh/hip lipoma excision.  Doing well from this perspective.  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  He is got multiple other agenda is that she needs to follow through with other providers.   Ronny Bacon M.D., FACS 04/26/2021, 9:46 AM

## 2021-04-26 NOTE — Patient Instructions (Signed)
Please call the office if you have any questions or concerns. 

## 2021-04-28 ENCOUNTER — Ambulatory Visit (INDEPENDENT_AMBULATORY_CARE_PROVIDER_SITE_OTHER): Payer: Medicare Other

## 2021-04-28 ENCOUNTER — Ambulatory Visit: Payer: Medicare Other | Admitting: Nurse Practitioner

## 2021-04-28 DIAGNOSIS — Z0000 Encounter for general adult medical examination without abnormal findings: Secondary | ICD-10-CM

## 2021-04-28 NOTE — Progress Notes (Signed)
Subjective:   [name] Robinson is a 70 y.o. female who presents for Medicare Annual (Subsequent) preventive examination.  I connected with  [name] on 04/28/21 by an audio only telemedicine application and verified that I am speaking with the correct person using two identifiers.   I discussed the limitations, risks, security and privacy concerns of performing an evaluation and management service by telephone and the availability of in person appointments. I also discussed with the patient that there may be a patient responsible charge related to this service. The patient expressed understanding and verbally consented to this telephonic visit.  Location of Patient: Home Location of Provider: [place] Persons Participating: [name] (Patient), Irena Reichmann ([place])  List any persons and their role that are participating in the visit with the patient.    Review of Systems    Defer to Provider Cardiac Risk Factors include: none     Objective:    Today's Vitals   04/28/21 1625  PainSc: 6    There is no height or weight on file to calculate BMI.  Advanced Directives 04/28/2021 04/13/2021 04/04/2021  Does Patient Have a Medical Advance Directive? No Yes Yes  Type of Advance Directive - Living will;Healthcare Power of [place];Living will  Does patient want to make changes to medical advance directive? No - Patient declined No - Patient declined -  Copy of [place] in Chart? - No - copy requested No - copy requested  Would patient like information on creating a medical advance directive? No - Patient declined - -    Current Medications (verified) Outpatient Encounter Medications as of 04/28/2021  Medication Sig   citalopram (CELEXA) 20 MG tablet Take 1 tablet (20 mg total) by mouth daily.   ergocalciferol (VITAMIN D2) 1.25 MG (50000 UT) capsule Take 1 capsule (50,000 Units total) by mouth once a week. (Patient  taking differently: Take 50,000 Units by mouth once a week. Monday)   Ferrous Sulfate 28 MG TABS Take 28 mg by mouth daily with breakfast.   furosemide (LASIX) 20 MG tablet Take 1 tablet (20 mg total) by mouth daily.   ibuprofen (ADVIL) 800 MG tablet Take 1 tablet (800 mg total) by mouth every 8 (eight) hours as needed.   levothyroxine (SYNTHROID) 137 MCG tablet Take 1 tablet (137 mcg total) by mouth daily before breakfast.   lipase/protease/amylase (CREON) 12000-38000 units CPEP capsule Take 1 Units by mouth 3 (three) times daily before meals.   ondansetron (ZOFRAN-ODT) 4 MG disintegrating tablet Take 4 mg by mouth every 8 (eight) hours as needed for nausea/vomiting. Hasn't started yet   vitamin B-12 (CYANOCOBALAMIN) 1000 MCG tablet Take 1,000 mcg by mouth daily.   No facility-administered encounter medications on file as of 04/28/2021.    Allergies (verified) Solifenacin   History: Past Medical History:  Diagnosis Date   Hypothyroidism    Morbid obesity ([place])    Sleep apnea    Past Surgical History:  Procedure Laterality Date   ABDOMINAL HYSTERECTOMY     BARIATRIC SURGERY N/A 01/02/2020   duodenal switch   CERVICAL DISC SURGERY N/A    discectomy   COLONOSCOPY     ESOPHAGOGASTRODUODENOSCOPY  11/06/2020   EYE SURGERY Bilateral    laser procedure   LAPAROSCOPIC COLON RESECTION     MASS EXCISION Left 04/13/2021   Procedure: EXCISION MASS, left lateral thigh;  Surgeon: Ronny Bacon, MD;  Location: [facility] ORS;  Service: General;  Laterality: Left;  SHOULDER SURGERY Left    torn cartilege   TONSILLECTOMY N/A    TOTAL HIP ARTHROPLASTY Right 11/02/2020   Family History  Problem Relation Age of Onset   Thyroid disease Mother    Cancer - Lung Father    Social History   Socioeconomic History   Marital status: Married    Spouse name: [name]   Number of children: 2   Years of education: Not on file   Highest education level: Not on file  Occupational History   Not on file   Tobacco Use   Smoking status: Never   Smokeless tobacco: Never  Vaping Use   Vaping Use: Never used  Substance and Sexual Activity   Alcohol use: Never   Drug use: Never   Sexual activity: Not Currently  Other Topics Concern   Not on file  Social History Narrative   Not on file   Social Determinants of Health   Financial Resource Strain: Low Risk    Difficulty of Paying Living Expenses: Not hard at all  Food Insecurity: No Food Insecurity   Worried About Charity fundraiser in the Last Year: Never true   [place] in the Last Year: Never true  Transportation Needs: No Transportation Needs   Lack of Transportation (Medical): No   Lack of Transportation (Non-Medical): No  Physical Activity: Sufficiently Active   Days of Exercise per Week: 7 days   Minutes of Exercise per Session: 30 min  Stress: No Stress Concern Present   Feeling of Stress : Not at all  Social Connections: Socially Integrated   Frequency of Communication with Friends and Family: Once a week   Frequency of Social Gatherings with Friends and Family: Twice a week   Attends Religious Services: More than 4 times per year   Active Member of [employer] or Organizations: Yes   Attends Music therapist: More than 4 times per year   Marital Status: Married    Tobacco Counseling Counseling given: Not Answered   Clinical Intake:  Pre-visit preparation completed: Yes  Pain : 0-10 Pain Score: 6  Pain Type: Chronic pain Pain Location: Abdomen Pain Descriptors / Indicators: Aching, Sharp, Constant, Other (Comment), Tingling Pain Onset: 1 to 4 weeks ago Pain Frequency: Intermittent     Diabetes: No  How often do you need to have someone help you when you read instructions, pamphlets, or other written materials from your doctor or pharmacy?: 1 - Never  Diabetic? No  Interpreter Needed?: No      Activities of Daily Living In your present state of health, do you have any difficulty  performing the following activities: 04/28/2021 04/04/2021  Hearing? N N  Vision? N N  Difficulty concentrating or making decisions? N N  Walking or climbing stairs? N N  Dressing or bathing? N N  Doing errands, shopping? N N  Preparing Food and eating ? N -  Using the Toilet? N -  In the past six months, have you accidently leaked urine? N -  Do you have problems with loss of bowel control? N -  Managing your Medications? N -  Managing your Finances? N -  Housekeeping or managing your Housekeeping? N -    Patient Care Team: Venita Lick, NP as PCP - General (Nurse Practitioner)  Indicate any recent Medical Services you may have received from other than [facility] providers in the past year (date may be approximate).     Assessment:   This is  a routine wellness examination for [place].  Hearing/Vision screen No results found.  Dietary issues and exercise activities discussed: Current Exercise Habits: Home exercise routine, Type of exercise: walking, Time (Minutes): 30, Frequency (Times/Week): 7, Weekly Exercise (Minutes/Week): 210, Exercise limited by: None identified   Goals Addressed   None   Depression Screen PHQ 2/9 Scores 04/28/2021 02/28/2021 02/28/2021 01/27/2021  PHQ - 2 Score 0 0 0 0  PHQ- 9 Score - 0 - -    Fall Risk Fall Risk  04/28/2021 01/27/2021  Falls in the past year? 0 0  Number falls in past yr: 0 0  Injury with Fall? 0 0  Risk for fall due to : No Fall Risks No Fall Risks  Follow up Falls evaluation completed Falls evaluation completed    [place]:  Any stairs in or around the home? No  If so, are there any without handrails? No  Home free of loose throw rugs in walkways, pet beds, electrical cords, etc? Yes  Adequate lighting in your home to reduce risk of falls? Yes   ASSISTIVE DEVICES UTILIZED TO PREVENT FALLS:  Life alert? No  Use of a cane, walker or w/c? No  Grab bars in the bathroom? No  Shower chair or  bench in shower? Yes  Elevated toilet seat or a handicapped toilet? Yes   TIMED UP AND GO:  Was the test performed? N/A .  Length of time to ambulate 10 feet: N/A sec.     Cognitive Function:     6CIT Screen 04/28/2021  What Year? 0 points  What month? 0 points  What time? 0 points  Count back from 20 0 points  Months in reverse 4 points  Repeat phrase 0 points  Total Score 4    Immunizations Immunization History  Administered Date(s) Administered   Influenza Inj Mdck Quad Pf 05/29/2017   Influenza, High Dose Seasonal PF 05/17/2018, 05/07/2019   Influenza,inj,Quad PF,6+ Mos 06/18/2015, 05/19/2016, 05/07/2019   Influenza-Unspecified 05/29/2017, 06/16/2020   PFIZER(Purple Top)SARS-COV-2 Vaccination 10/11/2019, 11/05/2019   PPD Test 01/04/2021   Pneumococcal Conjugate-13 05/07/2019   Td 02/28/2021   Tdap 06/21/2003    TDAP status: Up to date  Flu Vaccine status: Due, Education has been provided regarding the importance of this vaccine. Advised may receive this vaccine at local pharmacy or Health Dept. Aware to provide a copy of the vaccination record if obtained from local pharmacy or Health Dept. Verbalized acceptance and understanding.  Pneumococcal vaccine status: Due, Education has been provided regarding the importance of this vaccine. Advised may receive this vaccine at local pharmacy or Health Dept. Aware to provide a copy of the vaccination record if obtained from local pharmacy or Health Dept. Verbalized acceptance and understanding.  Covid-19 vaccine status: Completed vaccines  Qualifies for Shingles Vaccine? Yes   Zostavax completed No   Shingrix Completed?: No.    Education has been provided regarding the importance of this vaccine. Patient has been advised to call insurance company to determine out of pocket expense if they have not yet received this vaccine. Advised may also receive vaccine at local pharmacy or Health Dept. Verbalized acceptance and  understanding.  Screening Tests Health Maintenance  Topic Date Due   Zoster Vaccines- Shingrix (1 of 2) Never done   COVID-19 Vaccine (3 - Booster for Pfizer series) 04/06/2020   PNA vac Low Risk Adult (2 of 2 - PPSV23) 05/06/2020   INFLUENZA VACCINE  03/14/2021   COLONOSCOPY (Pts  45-2yr Insurance coverage will need to be confirmed)  02/18/2023   MAMMOGRAM  03/16/2023   DEXA SCAN  11/23/2024   TETANUS/TDAP  03/01/2031   Hepatitis C Screening  Completed   HPV VACCINES  Aged Out    Health Maintenance  Health Maintenance Due  Topic Date Due   Zoster Vaccines- Shingrix (1 of 2) Never done   COVID-19 Vaccine (3 - Booster for Pfizer series) 04/06/2020   PNA vac Low Risk Adult (2 of 2 - PPSV23) 05/06/2020   INFLUENZA VACCINE  03/14/2021    Colorectal cancer screening: Type of screening: Colonoscopy. Completed 02/17/2013. Repeat every 10 years  Mammogram status: Completed 03/15/2021. Repeat every year  Bone Density status: Completed 11/24/2019. Results reflect: Bone density results: NORMAL. Repeat every 5 years.  Lung Cancer Screening: (Low Dose CT Chest recommended if Age 693-80years, 30 pack-year currently smoking OR have quit w/in 15years.) does not qualify.   Lung Cancer Screening Referral: No  Additional Screening:  Hepatitis C Screening: does qualify; Completed 02/28/21  Vision Screening: Recommended annual ophthalmology exams for early detection of glaucoma and other disorders of the eye. Is the patient up to date with their annual eye exam?  Yes  Who is the provider or what is the name of the [facility] which the patient attends annual [facility] exams[facility] provider, would they like to be referred to a provider to establish care? No .   Dental Screening: Recommended annual dental exams for proper oral hygiene  Community Resource Referral / Chronic Care Management: CRR required this visit?  No   CCM required this visit?  No       Plan:     I have personally reviewed and noted the following in the patient's chart:   Medical and social history Use of alcohol, tobacco or illicit drugs  Current medications and supplements including opioid prescriptions.  Functional ability and status Nutritional status Physical activity Advanced directives List of other physicians Hospitalizations, surgeries, and ER visits in previous 12 months Vitals Screenings to include cognitive, depression, and falls Referrals and appointments  In addition, I have reviewed and discussed with patient certain preventive protocols, quality metrics, and best practice recommendations. A written personalized care plan for preventive services as well as general preventive health recommendations were provided to patient.  I connected with  TStacie Acreson 04/28/21 by a video enabled telemedicine application and verified that I am speaking with the correct person using two identifiers.   I discussed the limitations of evaluation and management by telemedicine. The patient expressed understanding and agreed to proceed.      DIrena Reichmann [facility]  04/28/2021   Nurse Notes: Non Face to Face 60 mintues

## 2021-04-28 NOTE — Patient Instructions (Signed)
Health Maintenance, Female Adopting a healthy lifestyle and getting preventive care are important in promoting health and wellness. Ask your health care provider about: The right schedule for you to have regular tests and exams. Things you can do on your own to prevent diseases and keep yourself healthy. What should I know about diet, weight, and exercise? Eat a healthy diet  Eat a diet that includes plenty of vegetables, fruits, low-fat dairy products, and lean protein. Do not eat a lot of foods that are high in solid fats, added sugars, or sodium. Maintain a healthy weight Body mass index (BMI) is used to identify weight problems. It estimates body fat based on height and weight. Your health care provider can help determine your BMI and help you achieve or maintain a healthy weight. Get regular exercise Get regular exercise. This is one of the most important things you can do for your health. Most adults should: Exercise for at least 150 minutes each week. The exercise should increase your heart rate and make you sweat (moderate-intensity exercise). Do strengthening exercises at least twice a week. This is in addition to the moderate-intensity exercise. Spend less time sitting. Even light physical activity can be beneficial. Watch cholesterol and blood lipids Have your blood tested for lipids and cholesterol at 68 years of age, then have this test every 5 years. Have your cholesterol levels checked more often if: Your lipid or cholesterol levels are high. You are older than 68 years of age. You are at high risk for heart disease. What should I know about cancer screening? Depending on your health history and family history, you may need to have cancer screening at various ages. This may include screening for: Breast cancer. Cervical cancer. Colorectal cancer. Skin cancer. Lung cancer. What should I know about heart disease, diabetes, and high blood pressure? Blood pressure and heart  disease High blood pressure causes heart disease and increases the risk of stroke. This is more likely to develop in people who have high blood pressure readings, are of African descent, or are overweight. Have your blood pressure checked: Every 3-5 years if you are 18-39 years of age. Every year if you are 40 years old or older. Diabetes Have regular diabetes screenings. This checks your fasting blood sugar level. Have the screening done: Once every three years after age 40 if you are at a normal weight and have a low risk for diabetes. More often and at a younger age if you are overweight or have a high risk for diabetes. What should I know about preventing infection? Hepatitis B If you have a higher risk for hepatitis B, you should be screened for this virus. Talk with your health care provider to find out if you are at risk for hepatitis B infection. Hepatitis C Testing is recommended for: Everyone born from 1945 through 1965. Anyone with known risk factors for hepatitis C. Sexually transmitted infections (STIs) Get screened for STIs, including gonorrhea and chlamydia, if: You are sexually active and are younger than 68 years of age. You are older than 68 years of age and your health care provider tells you that you are at risk for this type of infection. Your sexual activity has changed since you were last screened, and you are at increased risk for chlamydia or gonorrhea. Ask your health care provider if you are at risk. Ask your health care provider about whether you are at high risk for HIV. Your health care provider may recommend a prescription medicine   to help prevent HIV infection. If you choose to take medicine to prevent HIV, you should first get tested for HIV. You should then be tested every 3 months for as long as you are taking the medicine. Pregnancy If you are about to stop having your period (premenopausal) and you may become pregnant, seek counseling before you get  pregnant. Take 400 to 800 micrograms (mcg) of folic acid every day if you become pregnant. Ask for birth control (contraception) if you want to prevent pregnancy. Osteoporosis and menopause Osteoporosis is a disease in which the bones lose minerals and strength with aging. This can result in bone fractures. If you are 65 years old or older, or if you are at risk for osteoporosis and fractures, ask your health care provider if you should: Be screened for bone loss. Take a calcium or vitamin D supplement to lower your risk of fractures. Be given hormone replacement therapy (HRT) to treat symptoms of menopause. Follow these instructions at home: Lifestyle Do not use any products that contain nicotine or tobacco, such as cigarettes, e-cigarettes, and chewing tobacco. If you need help quitting, ask your health care provider. Do not use street drugs. Do not share needles. Ask your health care provider for help if you need support or information about quitting drugs. Alcohol use Do not drink alcohol if: Your health care provider tells you not to drink. You are pregnant, may be pregnant, or are planning to become pregnant. If you drink alcohol: Limit how much you use to 0-1 drink a day. Limit intake if you are breastfeeding. Be aware of how much alcohol is in your drink. In the [place] one drink equals one 12 oz bottle of beer (355 mL), one 5 oz glass of wine (148 mL), or one 1 oz glass of hard liquor (44 mL). General instructions Schedule regular health, dental, and eye exams. Stay current with your vaccines. Tell your health care provider if: You often feel depressed. You have ever been abused or do not feel safe at home. Summary Adopting a healthy lifestyle and getting preventive care are important in promoting health and wellness. Follow your health care provider's instructions about healthy diet, exercising, and getting tested or screened for diseases. Follow your health care provider's  instructions on monitoring your cholesterol and blood pressure. This information is not intended to replace advice given to you by your health care provider. Make sure you discuss any questions you have with your health care provider. Document Revised: 10/08/2020 Document Reviewed: 07/24/2018 Elsevier Patient Education  2022 Elsevier Inc.  

## 2021-05-16 ENCOUNTER — Other Ambulatory Visit: Payer: Self-pay

## 2021-05-16 ENCOUNTER — Ambulatory Visit
Admission: RE | Admit: 2021-05-16 | Discharge: 2021-05-16 | Disposition: A | Discharge status: Home / Self Care | Payer: Medicare Other | Source: Ambulatory Visit | Attending: Nurse Practitioner | Admitting: Nurse Practitioner

## 2021-05-16 DIAGNOSIS — R928 Other abnormal and inconclusive findings on diagnostic imaging of breast: Secondary | ICD-10-CM | POA: Insufficient documentation

## 2021-05-16 NOTE — Progress Notes (Signed)
Contacted via MyChart   Good afternoon [name] Robinson -- normal mammogram on repeat, they recommend recheck in one year:)

## 2021-05-20 ENCOUNTER — Other Ambulatory Visit: Payer: Self-pay

## 2021-05-20 ENCOUNTER — Encounter: Payer: Self-pay | Admitting: Nurse Practitioner

## 2021-05-20 ENCOUNTER — Ambulatory Visit (INDEPENDENT_AMBULATORY_CARE_PROVIDER_SITE_OTHER): Payer: Medicare Other | Admitting: Nurse Practitioner

## 2021-05-20 VITALS — BP 105/58 | HR 60 | Temp 98.5°F | Wt 149.8 lb

## 2021-05-20 DIAGNOSIS — M7989 Other specified soft tissue disorders: Secondary | ICD-10-CM

## 2021-05-20 DIAGNOSIS — D509 Iron deficiency anemia, unspecified: Secondary | ICD-10-CM | POA: Insufficient documentation

## 2021-05-20 DIAGNOSIS — D508 Other iron deficiency anemias: Secondary | ICD-10-CM

## 2021-05-20 DIAGNOSIS — Z23 Encounter for immunization: Secondary | ICD-10-CM

## 2021-05-20 NOTE — Progress Notes (Signed)
BP (!) 105/58   Pulse 60   Temp 98.5 F (36.9 C) (Oral)   Wt 149 lb 12.8 oz (67.9 kg)   SpO2 99%   BMI 23.46 kg/m    Subjective:    Patient ID: [name] Robinson, female    DOB: [birth year], 70 y.o.   MRN: [id number]  HPI: [name] Robinson is a 70 y.o. female  Chief Complaint  Patient presents with   Edema    Patient is here to follow up on swelling. Patient states she is doing better.    EDEMA  Was seen on 04/20/21 for swelling to lower extremities and provided Lasix to take daily for 2 weeks + referral to cardiology.  She reports no further edema noted, improved.  Sees cardiology on the 27th of October.  She had been traveling on the plan, which she felt was reason for the edema -- flew to [place] and drove around a lot.    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 Phillip Heal in [place]. Medication compliance: good compliance Aspirin: no Recurrent headaches: no Visual changes: no Palpitations: no Dyspnea: no Chest pain: no Lower extremity edema:  improved Dizzy/lightheaded: no   Relevant past medical, surgical, family and social history reviewed and updated as indicated. Interim medical history since our last visit reviewed. Allergies and medications reviewed and updated.  Review of Systems  Constitutional:  Negative for activity change, appetite change, diaphoresis, fatigue and fever.  Respiratory:  Negative for cough, chest tightness and shortness of breath.   Cardiovascular:  Negative for chest pain, palpitations and leg swelling.  Gastrointestinal: Negative.   Neurological: Negative.   Psychiatric/Behavioral: Negative.     Per HPI unless specifically indicated above     Objective:    BP (!) 105/58   Pulse 60   Temp 98.5 F (36.9 C) (Oral)   Wt 149 lb 12.8 oz (67.9 kg)   SpO2 99%   BMI 23.46 kg/m   Wt Readings from Last 3 Encounters:  05/20/21 149 lb 12.8 oz (67.9 kg)   04/26/21 156 lb 3.2 oz (70.9 kg)  04/20/21 171 lb (77.6 kg)    Physical Exam Vitals and nursing note reviewed.  Constitutional:      General: She is awake. She is not in acute distress.    Appearance: She is well-developed and well-groomed. She is not ill-appearing or toxic-appearing.  HENT:     Head: Normocephalic.     Right Ear: Hearing normal.     Left Ear: Hearing normal.  Eyes:     General: Lids are normal.        Right eye: No discharge.        Left eye: No discharge.     Conjunctiva/sclera: Conjunctivae normal.     Pupils: Pupils are equal, round, and reactive to light.  Neck:     Thyroid: No thyromegaly.     Vascular: No carotid bruit.  Cardiovascular:     Rate and Rhythm: Normal rate and regular rhythm.     Heart sounds: Normal heart sounds. No murmur heard.   No gallop.  Pulmonary:     Effort: Pulmonary effort is normal. No accessory muscle usage or respiratory distress.     Breath sounds: Normal breath sounds.  Abdominal:     General: Bowel sounds are normal.     Palpations: Abdomen is soft.  Musculoskeletal:     Cervical  back: Normal range of motion and neck supple.     Right lower leg: 1+ Edema present.     Left lower leg: 1+ Edema present.  Lymphadenopathy:     Cervical: No cervical adenopathy.  Skin:    General: Skin is warm and dry.  Neurological:     Mental Status: She is alert and oriented to person, place, and time.     Deep Tendon Reflexes: Reflexes are normal and symmetric.     Reflex Scores:      Brachioradialis reflexes are 2+ on the right side and 2+ on the left side.      Patellar reflexes are 2+ on the right side and 2+ on the left side. Psychiatric:        Attention and Perception: Attention normal.        Mood and Affect: Mood normal.        Speech: Speech normal.        Behavior: Behavior normal. Behavior is cooperative.        Thought Content: Thought content normal.   Results for orders placed or performed during the hospital  encounter of 04/13/21  Surgical pathology  Result Value Ref Range   SURGICAL PATHOLOGY      SURGICAL PATHOLOGY CASE: [id number] PATIENT: [name] Robinson Surgical Pathology Report     Specimen Submitted: A. Lipoma, left lateral thigh  Clinical History: Left thigh mass      DIAGNOSIS: A. SOFT TISSUE, LEFT LATERAL THIGH; EXCISION: - MATURE ADIPOSE TISSUE WITHOUT ATYPIA, COMPATIBLE WITH LIPOMA. - NEGATIVE FOR MALIGNANCY.   GROSS DESCRIPTION: A. Labeled: Left lateral thigh lipoma Received: Formalin Collection time: 7:56 AM on 04/13/2021 Placed into formalin time: 8:05 AM on 04/13/2021 Tissue fragment(s): Multiple Size: Aggregate, 6.3 x 6.2 x 2.5 cm Description: Received are fragments of yellow lobulated adipose tissue. Sectioning reveals yellow lobulated adipose tissue admixed with tan-white fibrous tissue.  No distinct masses or lesions are grossly identified. Representative sections (1/cm) are submitted in cassettes 1-2.  RB 04/13/2021  Final Diagnosis performed by Betsy Pries, MD.   Electronically signed 04/14/2021 11:50:40AM The electron ic signature indicates that the named Attending Pathologist has evaluated the specimen Technical component performed at [facility], [address], [place], [place] [address] Lab: [phone] Dir: Rush Farmer, MD, MMM  Professional component performed at [facility], [facility] [address], [place], [place], [place] [address] Lab: [phone] Dir: Dellia Nims. Reuel Derby, MD       Assessment & Plan:   Problem List Items Addressed This Visit       Other   Swelling of lower extremity - Primary    Bilateral noted, she reports improvement and at baseline now.  Recommend she wear compression hose daily -- suspect multifactorial with some PVD and iron deficiency anemia.  To wear compression hose on during day and off at night.  Has these at home and will start wearing again now that weather is cooler.  Plan on lab recheck in  January.      Iron deficiency anemia    Present since bariatric surgery.  Saw hematology in [place] recently and was discharged.  Continue supplements at home.  Recheck labs in January and if lows noted will plan to get her into hematology locally as may benefit from infusions, she has had in past.      Other Visit Diagnoses     Flu vaccine need       Flu vaccine today   Relevant Orders   Flu Vaccine QUAD  High Dose(Fluad) (Completed)        Follow up plan: Return for as scheduled in January.

## 2021-05-20 NOTE — Assessment & Plan Note (Signed)
Bilateral noted, she reports improvement and at baseline now.  Recommend she wear compression hose daily -- suspect multifactorial with some PVD and iron deficiency anemia.  To wear compression hose on during day and off at night.  Has these at home and will start wearing again now that weather is cooler.  Plan on lab recheck in January.

## 2021-05-20 NOTE — Assessment & Plan Note (Signed)
Present since bariatric surgery.  Saw hematology in [place] recently and was discharged.  Continue supplements at home.  Recheck labs in January and if lows noted will plan to get her into hematology locally as may benefit from infusions, she has had in past.

## 2021-05-20 NOTE — Patient Instructions (Signed)
Edema  Edema is when you have too much fluid in your body or under your skin. Edema may make your legs, feet, and ankles swell up. Swelling is also common in looser tissues, like around your eyes. This is a common condition. It gets more common as you get older. There are many possible causes of edema. Eating too much salt (sodium) and being on your feet or sitting for a long time can cause edema in yourlegs, feet, and ankles. Hot weather may make edema worse. Edema is usually painless. Your skin may look swollen or shiny. Follow these instructions at home: Keep the swollen body part raised (elevated) above the level of your heart when you are sitting or lying down. Do not sit still or stand for a long time. Do not wear tight clothes. Do not wear garters on your upper legs. Exercise your legs. This can help the swelling go down. Wear elastic bandages or support stockings as told by your doctor. Eat a low-salt (low-sodium) diet to reduce fluid as told by your doctor. Depending on the cause of your swelling, you may need to limit how much fluid you drink (fluid restriction). Take over-the-counter and prescription medicines only as told by your doctor. Contact a doctor if: Treatment is not working. You have heart, liver, or kidney disease and have symptoms of edema. You have sudden and unexplained weight gain. Get help right away if: You have shortness of breath or chest pain. You cannot breathe when you lie down. You have pain, redness, or warmth in the swollen areas. You have heart, liver, or kidney disease and get edema all of a sudden. You have a fever and your symptoms get worse all of a sudden. Summary Edema is when you have too much fluid in your body or under your skin. Edema may make your legs, feet, and ankles swell up. Swelling is also common in looser tissues, like around your eyes. Raise (elevate) the swollen body part above the level of your heart when you are sitting or lying  down. Follow your doctor's instructions about diet and how much fluid you can drink (fluid restriction). This information is not intended to replace advice given to you by your health care provider. Make sure you discuss any questions you have with your healthcare provider. Document Revised: 05/26/2020 Document Reviewed: 05/26/2020 Elsevier Patient Education  2022 Elsevier Inc.  

## 2021-06-01 ENCOUNTER — Ambulatory Visit: Payer: Medicare Other | Admitting: Nurse Practitioner

## 2021-06-01 ENCOUNTER — Encounter: Payer: Self-pay | Admitting: Internal Medicine

## 2021-06-01 ENCOUNTER — Ambulatory Visit (INDEPENDENT_AMBULATORY_CARE_PROVIDER_SITE_OTHER): Payer: Medicare Other | Admitting: Internal Medicine

## 2021-06-01 ENCOUNTER — Other Ambulatory Visit: Payer: Self-pay

## 2021-06-01 VITALS — BP 128/70 | HR 59 | Temp 98.6°F | Ht 67.01 in | Wt 154.6 lb

## 2021-06-01 DIAGNOSIS — R309 Painful micturition, unspecified: Secondary | ICD-10-CM | POA: Diagnosis not present

## 2021-06-01 DIAGNOSIS — R319 Hematuria, unspecified: Secondary | ICD-10-CM | POA: Diagnosis not present

## 2021-06-01 DIAGNOSIS — N390 Urinary tract infection, site not specified: Secondary | ICD-10-CM | POA: Insufficient documentation

## 2021-06-01 LAB — URINALYSIS, ROUTINE W REFLEX MICROSCOPIC
Bilirubin, UA: NEGATIVE
Glucose, UA: NEGATIVE
Ketones, UA: NEGATIVE
Nitrite, UA: POSITIVE — AB
Specific Gravity, UA: 1.025 (ref 1.005–1.030)
Urobilinogen, Ur: 0.2 mg/dL (ref 0.2–1.0)
pH, UA: 5.5 (ref 5.0–7.5)

## 2021-06-01 LAB — MICROSCOPIC EXAMINATION

## 2021-06-01 MED ORDER — SULFAMETHOXAZOLE-TRIMETHOPRIM 800-160 MG PO TABS: 1.0000 | ORAL_TABLET | Freq: Two times a day (BID) | ORAL | 0 refills | Status: AC

## 2021-06-01 NOTE — Progress Notes (Signed)
BP 128/70   Pulse (!) 59   Temp 98.6 F (37 C) (Oral)   Ht 5' 7.01" (1.702 m)   Wt 154 lb 9.6 oz (70.1 kg)   SpO2 99%   BMI 24.21 kg/m    Subjective:    Patient ID: [name] Robinson, female    DOB: [birth year], 70 y.o.   MRN: [id number]  Chief Complaint  Patient presents with  . painful urination    For the past week.     HPI: [name] Robinson is a 70 y.o. female  Patient presents with: painful urination: For the past week.     Urinary Tract Infection  This is a new problem. The current episode started in the past 7 days. The quality of the pain is described as aching. The pain is at a severity of 4/10. The pain is mild. There has been no fever. Associated symptoms include frequency and hematuria. Pertinent negatives include no chills, discharge, flank pain, hesitancy, nausea, possible pregnancy, sweats, urgency or vomiting. Her past medical history is significant for recurrent UTIs.   Chief Complaint  Patient presents with  . painful urination    For the past week.     Relevant past medical, surgical, family and social history reviewed and updated as indicated. Interim medical history since our last visit reviewed. Allergies and medications reviewed and updated.  Review of Systems  Constitutional:  Negative for chills.  Gastrointestinal:  Negative for nausea and vomiting.  Genitourinary:  Positive for frequency and hematuria. Negative for flank pain, hesitancy and urgency.   Per HPI unless specifically indicated above     Objective:    BP 128/70   Pulse (!) 59   Temp 98.6 F (37 C) (Oral)   Ht 5' 7.01" (1.702 m)   Wt 154 lb 9.6 oz (70.1 kg)   SpO2 99%   BMI 24.21 kg/m   Wt Readings from Last 3 Encounters:  06/01/21 154 lb 9.6 oz (70.1 kg)  05/20/21 149 lb 12.8 oz (67.9 kg)  04/26/21 156 lb 3.2 oz (70.9 kg)    Physical Exam Vitals and nursing note reviewed.  Constitutional:      General: She is not in acute distress.    Appearance: Normal appearance.  She is not ill-appearing or diaphoretic.  Eyes:     Conjunctiva/sclera: Conjunctivae normal.  Pulmonary:     Breath sounds: No rhonchi.  Abdominal:     General: Abdomen is flat. Bowel sounds are normal. There is no distension.     Palpations: Abdomen is soft.     Tenderness: There is no abdominal tenderness. There is no right CVA tenderness, left CVA tenderness or guarding.  Skin:    General: Skin is warm and dry.  Neurological:     Mental Status: She is alert.  Psychiatric:        Mood and Affect: Mood normal.        Behavior: Behavior normal.        Thought Content: Thought content normal.        Judgment: Judgment normal.    Results for orders placed or performed during the hospital encounter of 04/13/21  Surgical pathology  Result Value Ref Range   SURGICAL PATHOLOGY      SURGICAL PATHOLOGY CASE: [id number] PATIENT: [name] Robinson Surgical Pathology Report     Specimen Submitted: A. Lipoma, left lateral thigh  Clinical History: Left thigh mass      DIAGNOSIS: A. SOFT TISSUE, LEFT LATERAL THIGH; EXCISION: - MATURE ADIPOSE  TISSUE WITHOUT ATYPIA, COMPATIBLE WITH LIPOMA. - NEGATIVE FOR MALIGNANCY.   GROSS DESCRIPTION: A. Labeled: Left lateral thigh lipoma Received: Formalin Collection time: 7:56 AM on 04/13/2021 Placed into formalin time: 8:05 AM on 04/13/2021 Tissue fragment(s): Multiple Size: Aggregate, 6.3 x 6.2 x 2.5 cm Description: Received are fragments of yellow lobulated adipose tissue. Sectioning reveals yellow lobulated adipose tissue admixed with tan-white fibrous tissue.  No distinct masses or lesions are grossly identified. Representative sections (1/cm) are submitted in cassettes 1-2.  RB 04/13/2021  Final Diagnosis performed by Betsy Pries, MD.   Electronically signed 04/14/2021 11:50:40AM The electron ic signature indicates that the named Attending Pathologist has evaluated the specimen Technical component performed at [facility], [address], [place], [place] [address] Lab: [phone] Dir: Rush Farmer, MD, MMM  Professional component performed at [facility], [facility], [place], [place], [place] [address] Lab: [phone] Dir: Dellia Nims. Rubinas, MD         Current Outpatient Medications:  .  sulfamethoxazole-trimethoprim (BACTRIM DS) 800-160 MG tablet, Take 1 tablet by mouth 2 (two) times daily for 5 days., Disp: 10 tablet, Rfl: 0 .  citalopram (CELEXA) 20 MG tablet, Take 1 tablet (20 mg total) by mouth daily., Disp: 90 tablet, Rfl: 4 .  ergocalciferol (VITAMIN D2) 1.25 MG (50000 UT) capsule, Take 1 capsule (50,000 Units total) by mouth once a week. (Patient taking differently: Take 50,000 Units by mouth once a week. Monday), Disp: 12 capsule, Rfl: 5 .  Ferrous Sulfate 28 MG TABS, Take 28 mg by mouth daily with breakfast., Disp: , Rfl:  .  furosemide (LASIX) 20 MG tablet, Take 1 tablet (20 mg total) by mouth daily., Disp: 14 tablet, Rfl: 0 .  ibuprofen (ADVIL) 800 MG tablet, Take 1 tablet (800 mg total) by mouth every 8 (eight) hours as needed. (Patient not taking: Reported on 06/01/2021), Disp: 30 tablet, Rfl: 0 .  levothyroxine (SYNTHROID) 137 MCG tablet, Take 1 tablet (137 mcg total) by mouth daily before breakfast., Disp: 90 tablet, Rfl: 4 .  lipase/protease/amylase (CREON) 12000-38000 units CPEP capsule, Take 1 Units by mouth 3 (three) times daily before meals. (Patient not taking: Reported on 06/01/2021), Disp: , Rfl:  .  ondansetron (ZOFRAN-ODT) 4 MG disintegrating tablet, Take 4 mg by mouth every 8 (eight) hours as needed for nausea/vomiting. Hasn't started yet, Disp: , Rfl:  .  vitamin B-12 (CYANOCOBALAMIN) 1000 MCG tablet, Take 1,000 mcg by mouth daily., Disp: , Rfl:     Assessment & Plan:  UTI : UA shows  check UA.   pt is currently symptomatic for an Urinary tract infection(abd pain, burning etc), will cover with bactrim DS encouraged to increase water/fluid intake.Signs and  symptoms of emergency were discussed with the patient. The risks, benefits and side effects of treatment were discussed with the patient. The patient verbalized an understanding of plan, and was told to call the clinic/go to the ED if symptoms worsen at any point of time.  Problem List Items Addressed This Visit       Genitourinary   Urinary tract infection with hematuria   Relevant Medications   sulfamethoxazole-trimethoprim (BACTRIM DS) 800-160 MG tablet     Other   Painful urination - Primary   Relevant Orders   Urinalysis, Routine w reflex microscopic   Urine Culture     Orders Placed This Encounter  Procedures  . Urine Culture  . Urinalysis, Routine w reflex microscopic     Meds ordered this encounter  Medications  .  sulfamethoxazole-trimethoprim (BACTRIM DS) 800-160 MG tablet    Sig: Take 1 tablet by mouth 2 (two) times daily for 5 days.    Dispense:  10 tablet    Refill:  0     Follow up plan: No follow-ups on file.

## 2021-06-06 LAB — URINE CULTURE

## 2021-06-08 NOTE — Progress Notes (Signed)
Primary Care Provider: Venita Lick, NP [place] Cardiologist: None Electrophysiologist: None  Clinic Note: Chief Complaint  Patient presents with   New Patient (Initial Visit)    Slow heart rate, leg swelling   Bradycardia   Edema    ===================================  ASSESSMENT/PLAN   Problem List Items Addressed This Visit       Cardiology Problems   Bradycardia, sinus - Primary    Her heart rate is not dangerously low.  59 beats minute now.  There is been recordings in the 40s.  Need to determine what the true heart rate is.  She has a smart watch that helps, but I would like to see what the true rhythm is.  There may be some potential heart block associated with this. Plan: 7-day Zio patch monitor.  This will both show me bradycardia but also responsiveness to exercise/chronotropic competence level.       Relevant Orders   EKG 12-Lead   LONG TERM MONITOR (3-14 DAYS)     Other   History of bariatric surgery   Lower extremity edema    Left greater than right lower extremity edema.  She did have that surgery on the left leg and there could be some association with that.  The swelling is minimal on exam, and audible be worrisome.  She has no evidence of DVT. Marland Kitchen Plan: She was told to liberalize salt intake and likely to back off on the salt. Will check lower extremity venous reflux Dopplers to determine if there is any evidence of venous reflux. Continue to recommend foot elevation along with support stockings while working.      Relevant Orders   VAS [place] LOWER EXTREMITY VENOUS REFLUX   OSA on CPAP (Chronic)    Most likely reason for nocturnal bradycardia is related to sleep disordered breathing.  She was not wearing her CPAP while in hospital postoperatively.  Would like to see what is now that she is back at home on CPAP.   Plan: Continues CPAP, check 7-day Zio patch monitor to determine heart rate          ===================================  HPI:    [name] Robinson is a morbidly obese 70 y.o. female with History of Thrombocytopenia (Followed by [facility]), s/p Bariatric Surgery who is being seen today for the evaluation of [name] Robinson at the request of Jon Billings, NP.  [name] Robinson seen on October 02, 2019 by Dr. Shearon Stalls from [facility] (via video visit) for follow-up preop evaluation for bariatric surgery scheduled for April 2021.  She was noted to have borderline hypertension, and morbid obesity as well as OSA but no prior cardiac evaluation.  She indicated that left knee injury limited her ambulation.  -> Normal echocardiogram.  Considered low risk candidate for her surgery. Recommended lifestyle modification with diet changes to improve blood pressure and weight loss.  Continue use of CPAP  Recent Hospitalizations:  04/13/2021: Outpatient left lower extremity lipoma resection 04/15/2021: Iron deficiency anemia, small bowel resection.  Indication was malabsorption. 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  Uncomplicated course.  Was noted to have pre and postop ate out okay up is like no hernias I have not take a at by bradycardia along with anemia/pancytopenia.  She was treated with IV iron and folate. Bradycardia was noted at 3:40 AM. ->  Likely related to OSA, no issues with hypotension.  No evidence of AV block.  Cardiology consulted, &  Did not recommend any additional work-up.    She was seen by Marnee Guarneri, NP for follow-up evaluation of lower extremity swelling.  She was seen on September 7 for lower extremity swelling, was given a Rx for Lasix and a referral to cardiology.  Noted no further edema.  She indicated that she had been traveling on a plane having prolonged to [place] and spent a lot of time in the car.  She then traveled back. --> She indicated that her edema is improved.  Compression stockings were  recommended. Also, during the initial visit on September 7, she noted having history of bradycardia during her hospitalization back in September.  Reviewed  CV studies:    The following studies were reviewed today: (if available, images/films reviewed: From Epic Chart or Care Everywhere)  Transthoracic Echo 07/14/2019 [facility]): Technically difficult.-Used Definity.  Normal LV function with EF of 55 to 60%.  No obvious WMA.  Mild MR.  No effusion Lower Extremity Venous Ultrasound 04/16/2021 ([place]): No DVT.  Suspect right lower extremity Baker's cyst in the popliteal area. Transthoracic Echo 04/16/2021-[facility]: (Fair quality) normal LV size and function.  EF 55-60%.  No R WMA.  Normal diastolic parameters.  Mild MR.  Unable to assess RVSP.   Interval History:   [name] Robinson presents here today honestly not very certain as to why she is here.    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  She is on her feet all day long and has no time to elevate her feet.  However, When she does elevate her feet, the swelling goes down.  The swelling in the left leg worse than the right.  She does have Lasix listed as a medication, but she is not using it.  She ran out after the initial prescription.  She denies any PND, or orthopnea.  She denies any chest pain or pressure with rest or exertion, and is quite active.  He denies any irregular heartbeats or palpitations besides a few skipping beats here and there.  Abnormal heart rate is noted to be slow at times, she says its not a problem for her to get her heart rate up when she is active.  No exercise intolerance.  She has not had any syncope or near syncopal episodes.  She still uses CPAP at night.  CV Review of Symptoms (Summary) Cardiovascular ROS: no chest pain or dyspnea on exertion positive for -  edema negative for - irregular heartbeat, orthopnea, palpitations, paroxysmal nocturnal dyspnea, rapid heart rate, shortness of breath, or syncope/near syncope or TIA/amaurosis fugax, claudication  REVIEWED OF SYSTEMS   Review of Systems  Constitutional:  Positive for weight loss (Overall 150 pounds down since bariatric surgery). Negative for malaise/fatigue.  Respiratory:  Negative for cough and shortness of breath.   Cardiovascular:  Positive for leg swelling (Per HPI).  Gastrointestinal:  Negative for blood in stool, constipation and melena.  Genitourinary:  Negative for hematuria.  Musculoskeletal:  Positive for back pain and joint pain.  Neurological:  Negative for dizziness, focal weakness and weakness.  Psychiatric/Behavioral: Negative.     I have reviewed and (if needed) personally updated the patient's problem list, medications, allergies, past medical and surgical history, social and family history.   PAST MEDICAL HISTORY   Past Medical History:  Diagnosis Date   Hypothyroidism  Morbid obesity ([place]) 11/2019   s/p Barriatric [facility])   Sleep apnea     PAST SURGICAL HISTORY   Past Surgical History:  Procedure Laterality Date   ABDOMINAL HYSTERECTOMY     BARIATRIC SURGERY N/A 01/02/2020   duodenal switch   CERVICAL DISC SURGERY N/A    discectomy   COLONOSCOPY     ESOPHAGOGASTRODUODENOSCOPY  11/06/2020   EYE SURGERY Bilateral    laser procedure   LAPAROSCOPIC COLON RESECTION     MASS EXCISION Left 04/13/2021   Procedure: EXCISION MASS, left lateral thigh;  Surgeon: Ronny Bacon, MD;  Location: [facility] ORS;  Service: General;  Laterality: Left;   SHOULDER SURGERY Left    torn cartilege   TONSILLECTOMY N/A    TOTAL HIP ARTHROPLASTY Right 11/02/2020   TRANSTHORACIC ECHOCARDIOGRAM  07/14/2019   a) [facility]) Technically difficult.-Used Definity.  Nl LV Fxn -EF 55 - 60%.  No obvious WMA.  Mild MR.  No effusion;; b) Echo 04/16/2021-[facility]: (Fair quality)  normal LV size and function.  EF 55-60%.  No R WMA.  Normal diastolic parameters.  Mild MR.  Unable to assess RVSP.    Immunization History  Administered Date(s) Administered   Fluad Quad(high Dose 65+) 05/20/2021   Influenza Inj Mdck Quad Pf 05/29/2017   Influenza, High Dose Seasonal PF 05/17/2018, 05/07/2019   Influenza,inj,Quad PF,6+ Mos 06/18/2015, 05/19/2016, 05/07/2019   Influenza-Unspecified 05/29/2017, 06/16/2020   PFIZER(Purple Top)SARS-COV-2 Vaccination 10/11/2019, 11/05/2019   PPD Test 01/04/2021   Pneumococcal Conjugate-13 05/07/2019   Td 02/28/2021   Tdap 06/21/2003    MEDICATIONS/ALLERGIES   Current Meds  Medication Sig   citalopram (CELEXA) 20 MG tablet Take 1 tablet (20 mg total) by mouth daily.   ergocalciferol (VITAMIN D2) 1.25 MG (50000 UT) capsule Take 1 capsule (50,000 Units total) by mouth once a week. (Patient taking differently: Take 50,000 Units by mouth once a week. Monday)   levothyroxine (SYNTHROID) 137 MCG tablet Take 1 tablet (137 mcg total) by mouth daily before breakfast.   vitamin B-12 (CYANOCOBALAMIN) 1000 MCG tablet Take 1,000 mcg by mouth daily.    Allergies  Allergen Reactions   Solifenacin Rash    Patient had bad rash with this med.    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  This involves pretty much total care.   Otherwise, she is usually on the go, not sedentary.   Family History  Problem Relation Age of Onset   Thyroid disease Mother    Cancer - Lung Father    Father was a longtime smoker; mother drowned @ 60.   OBJCTIVE -PE, EKG, labs   Wt Readings from Last 3 Encounters:   06/10/21 150 lb (68 kg)  06/09/21 145 lb 3.2 oz (65.9 kg)  06/01/21 154 lb 9.6 oz (70.1 kg)    Physical Exam: BP (!) 90/52   Pulse (!) 59   Ht 5\' 7"  (1.702 m)   Wt 145 lb 3.2 oz (65.9 kg)   SpO2 96%   BMI 22.74 kg/m  Physical Exam Vitals reviewed.  Constitutional:  General: She is not in acute distress.    Appearance: Normal appearance. She is normal weight. She is not ill-appearing or toxic-appearing.  HENT:     Head: Normocephalic and atraumatic.  Neck:     Vascular: No carotid bruit, hepatojugular reflux or JVD.  Cardiovascular:     Rate and Rhythm: Normal rate and regular rhythm. No extrasystoles are present.    Chest Wall: PMI is not displaced.     Pulses: Normal pulses and intact distal pulses.     Heart sounds: S1 normal and S2 normal. No murmur (Cannot exclude soft systolic murmur.) heard.   No friction rub. No gallop.  Musculoskeletal:     Cervical back: Normal range of motion and neck supple.  Neurological:     Mental Status: She is alert.     Adult ECG Report  Rate: 59 ;  Rhythm: sinus bradycardia and otherwise normal EKG.  Normal axis, intervals and durations. ;   Narrative Interpretation: Stable  Recent Labs:  Reviewed  Lab Results  Component Value Date   CHOL 115 02/28/2021   HDL 69 02/28/2021   LDLCALC 32 02/28/2021   TRIG 64 02/28/2021   Lab Results  Component Value Date   CREATININE 0.52 06/10/2021   BUN 15 06/10/2021   NA 138 06/10/2021   K 3.2 (L) 06/10/2021   CL 106 06/10/2021   CO2 24 06/10/2021   CBC Latest Ref Rng & Units 06/10/2021 02/28/2021  WBC 4.0 - 10.5 K/uL 8.1 4.7  Hemoglobin 12.0 - 15.0 g/dL 11.8(L) 10.6(L)  Hematocrit 36.0 - 46.0 % 36.1 31.9(L)  Platelets 150 - 400 K/uL 239 170    No results found for: HGBA1C Lab Results  Component Value Date   TSH 1.250 02/28/2021    ==================================================  COVID-19 Education: The signs and symptoms of COVID-19 were discussed with the patient and  how to seek care for testing (follow up with PCP or arrange E-visit).    I spent a total of 17 minutes with the patient spent in direct patient consultation.  Additional time spent with chart review  / charting (studies, outside notes, etc): 16 min ; Pre-charting 28 min. Total Time: 61 min  Current medicines are reviewed at length with the patient today.  (+/- concerns) None  This visit occurred during the SARS-CoV-2 public health emergency.  Safety protocols were in place, including screening questions prior to the visit, additional usage of staff PPE, and extensive cleaning of exam room while observing appropriate contact time as indicated for disinfecting solutions.  Notice: This dictation was prepared with Dragon dictation along with smart phrase technology. Any transcriptional errors that result from this process are unintentional and may not be corrected upon review.   Studies Ordered:  Orders Placed This Encounter  Procedures   LONG TERM MONITOR (3-14 DAYS)   EKG 12-Lead   VAS [place] LOWER EXTREMITY VENOUS REFLUX     Patient Instructions / Medication Changes & Studies & Tests Ordered   Patient Instructions  Medication Instructions:  Your physician recommends that you continue on your current medications as directed. Please refer to the Current Medication list given to you today.  *If you need a refill on your cardiac medications before your next appointment, please call your pharmacy*   Lab Work: None If you have labs (blood work) drawn today and your tests are completely normal, you will receive your results only by: [place] (if you have MyChart) OR A paper copy in the mail If you have  any lab test that is abnormal or we need to change your treatment, we will call you to review the results.   Testing/Procedures: Bryn [place]ull[place]- Long Term Monitor Instructions  Your physician has requested you wear a ZIO patch monitor for 14 days.   Your physician has requested that you  have a lower or upper extremity venous duplex. This test is an ultrasound of the veins in the legs or arms. It looks at venous blood flow that carries blood from the heart to the legs or arms. Allow one hour for a Lower Venous exam. Allow thirty minutes for an Upper Venous exam. There are no restrictions or special instructions.    Follow-Up: At [facility], you and your health needs are our priority.  As part of our continuing mission to provide you with exceptional heart care, we have created designated Provider Care Teams.  These Care Teams include your primary Cardiologist (physician) and Advanced Practice Providers (APPs -  Physician Assistants and Nurse Practitioners) who all work together to provide you with the care you need, when you need it.   Your next appointment:   Follow up after testing  The format for your next appointment:   In Person  Provider:   Leonie Man, MD     Glenetta Hew, M.D., M.S. Interventional Cardiologist   Pager # [phone] Phone # [phone] [address] [place], [place] [address]   Thank you for choosing Heartcare at [facility]!!

## 2021-06-09 ENCOUNTER — Ambulatory Visit (INDEPENDENT_AMBULATORY_CARE_PROVIDER_SITE_OTHER): Payer: Medicare Other

## 2021-06-09 ENCOUNTER — Other Ambulatory Visit: Payer: Self-pay

## 2021-06-09 ENCOUNTER — Encounter: Payer: Self-pay | Admitting: Cardiology

## 2021-06-09 ENCOUNTER — Ambulatory Visit (INDEPENDENT_AMBULATORY_CARE_PROVIDER_SITE_OTHER): Payer: Medicare Other | Admitting: Cardiology

## 2021-06-09 VITALS — BP 90/52 | HR 59 | Ht 67.0 in | Wt 145.2 lb

## 2021-06-09 DIAGNOSIS — R600 Localized edema: Secondary | ICD-10-CM | POA: Diagnosis not present

## 2021-06-09 DIAGNOSIS — Z9989 Dependence on other enabling machines and devices: Secondary | ICD-10-CM

## 2021-06-09 DIAGNOSIS — Z9884 Bariatric surgery status: Secondary | ICD-10-CM | POA: Diagnosis not present

## 2021-06-09 DIAGNOSIS — R001 Bradycardia, unspecified: Secondary | ICD-10-CM

## 2021-06-09 DIAGNOSIS — G4733 Obstructive sleep apnea (adult) (pediatric): Secondary | ICD-10-CM

## 2021-06-09 NOTE — Patient Instructions (Addendum)
Medication Instructions:  Your physician recommends that you continue on your current medications as directed. Please refer to the Current Medication list given to you today.  *If you need a refill on your cardiac medications before your next appointment, please call your pharmacy*   Lab Work: None If you have labs (blood work) drawn today and your tests are completely normal, you will receive your results only by: [place] (if you have MyChart) OR A paper copy in the mail If you have any lab test that is abnormal or we need to change your treatment, we will call you to review the results.   Testing/Procedures: Bryn Gulling- Long Term Monitor Instructions  Your physician has requested you wear a ZIO patch monitor for 14 days.  This is a single patch monitor. [employer] supplies one patch monitor per enrollment. Additional stickers are not available. Please do not apply patch if you will be having a Nuclear Stress Test,  Echocardiogram, Cardiac CT, MRI, or Chest Xray during the period you would be wearing the  monitor. The patch cannot be worn during these tests. You cannot remove and re-apply the  ZIO XT patch monitor.  Your ZIO patch monitor will be mailed 3 day USPS to your address on file. It may take 3-5 days  to receive your monitor after you have been enrolled.  Once you have received your monitor, please review the enclosed instructions. Your monitor  has already been registered assigning a specific monitor serial # to you.  Billing and Patient Assistance Program Information  We have supplied [employer] with any of your insurance information on file for billing purposes. [employer] offers a sliding scale Patient Assistance Program for patients that do not have  insurance, or whose insurance does not completely cover the cost of the ZIO monitor.  You must apply for the Patient Assistance Program to qualify for this discounted rate.  To apply, please call [employer] at [phone], select  option 4, select option 2, ask to apply for  Patient Assistance Program. Theodore Demark will ask your household income, and how many people  are in your household. They will quote your out-of-pocket cost based on that information.  [employer] will also be able to set up a 41-month interest-free payment plan if needed.  Applying the monitor   Shave hair from upper left chest.  Hold abrader disc by orange tab. Rub abrader in 40 strokes over the upper left chest as  indicated in your monitor instructions.  Clean area with 4 enclosed alcohol pads. Let dry.  Apply patch as indicated in monitor instructions. Patch will be placed under collarbone on left  side of chest with arrow pointing upward.  Rub patch adhesive wings for 2 minutes. Remove white label marked "1". Remove the white  label marked "2". Rub patch adhesive wings for 2 additional minutes.  While looking in a mirror, press and release button in center of patch. A small green light will  flash 3-4 times. This will be your only indicator that the monitor has been turned on.  Do not shower for the first 24 hours. You may shower after the first 24 hours.  Press the button if you feel a symptom. You will hear a small click. Record Date, Time and  Symptom in the Patient Logbook.  When you are ready to remove the patch, follow instructions on the last 2 pages of Patient  Logbook. Stick patch monitor onto the last page of Patient Logbook.  Place Patient Logbook in  the blue and white box. Use locking tab on box and tape box closed  securely. The blue and white box has prepaid postage on it. Please place it in the mailbox as  soon as possible. Your physician should have your test results approximately 7 days after the  monitor has been mailed back to [facility].  Call [place] at [phone] if you have questions regarding  your ZIO XT patch monitor. Call them immediately if you see an orange light blinking on your  monitor.   If your monitor falls off in less than 4 days, contact our Monitor department at [phone].  If your monitor becomes loose or falls off after 4 days call [employer] at [phone] for  suggestions on securing your monitor     Your physician has requested that you have a lower or upper extremity venous duplex. This test is an ultrasound of the veins in the legs or arms. It looks at venous blood flow that carries blood from the heart to the legs or arms. Allow one hour for a Lower Venous exam. Allow thirty minutes for an Upper Venous exam. There are no restrictions or special instructions.    Follow-Up: At [facility], you and your health needs are our priority.  As part of our continuing mission to provide you with exceptional heart care, we have created designated Provider Care Teams.  These Care Teams include your primary Cardiologist (physician) and Advanced Practice Providers (APPs -  Physician Assistants and Nurse Practitioners) who all work together to provide you with the care you need, when you need it.   Your next appointment:   Follow up after testing  The format for your next appointment:   In Person  Provider:   Leonie Man, MD

## 2021-06-10 ENCOUNTER — Emergency Department
Admission: EM | Admit: 2021-06-10 | Discharge: 2021-06-10 | Disposition: A | Discharge status: Home / Self Care | Payer: Medicare Other | Attending: Emergency Medicine | Admitting: Emergency Medicine

## 2021-06-10 ENCOUNTER — Encounter: Payer: Self-pay | Admitting: Emergency Medicine

## 2021-06-10 ENCOUNTER — Other Ambulatory Visit: Payer: Self-pay

## 2021-06-10 ENCOUNTER — Emergency Department: Payer: Medicare Other

## 2021-06-10 DIAGNOSIS — E039 Hypothyroidism, unspecified: Secondary | ICD-10-CM | POA: Diagnosis not present

## 2021-06-10 DIAGNOSIS — Z9884 Bariatric surgery status: Secondary | ICD-10-CM

## 2021-06-10 DIAGNOSIS — K567 Ileus, unspecified: Secondary | ICD-10-CM | POA: Diagnosis not present

## 2021-06-10 DIAGNOSIS — R1084 Generalized abdominal pain: Secondary | ICD-10-CM | POA: Diagnosis not present

## 2021-06-10 DIAGNOSIS — R109 Unspecified abdominal pain: Secondary | ICD-10-CM | POA: Diagnosis present

## 2021-06-10 LAB — URINALYSIS, ROUTINE W REFLEX MICROSCOPIC
Bacteria, UA: NONE SEEN
Bilirubin Urine: NEGATIVE
Glucose, UA: NEGATIVE mg/dL
Ketones, ur: 5 mg/dL — AB
Leukocytes,Ua: NEGATIVE
Nitrite: NEGATIVE
Protein, ur: NEGATIVE mg/dL
Specific Gravity, Urine: 1.016 (ref 1.005–1.030)
pH: 5 (ref 5.0–8.0)

## 2021-06-10 LAB — CBC WITH DIFFERENTIAL/PLATELET
Abs Immature Granulocytes: 0.03 10*3/uL (ref 0.00–0.07)
Basophils Absolute: 0 10*3/uL (ref 0.0–0.1)
Basophils Relative: 0 %
Eosinophils Absolute: 0 10*3/uL (ref 0.0–0.5)
Eosinophils Relative: 0 %
HCT: 36.1 % (ref 36.0–46.0)
Hemoglobin: 11.8 g/dL — ABNORMAL LOW (ref 12.0–15.0)
Immature Granulocytes: 0 %
Lymphocytes Relative: 9 %
Lymphs Abs: 0.7 10*3/uL (ref 0.7–4.0)
MCH: 31.2 pg (ref 26.0–34.0)
MCHC: 32.7 g/dL (ref 30.0–36.0)
MCV: 95.5 fL (ref 80.0–100.0)
Monocytes Absolute: 0.2 10*3/uL (ref 0.1–1.0)
Monocytes Relative: 2 %
Neutro Abs: 7.1 10*3/uL (ref 1.7–7.7)
Neutrophils Relative %: 89 %
Platelets: 239 10*3/uL (ref 150–400)
RBC: 3.78 MIL/uL — ABNORMAL LOW (ref 3.87–5.11)
RDW: 14.3 % (ref 11.5–15.5)
WBC: 8.1 10*3/uL (ref 4.0–10.5)
nRBC: 0 % (ref 0.0–0.2)

## 2021-06-10 LAB — COMPREHENSIVE METABOLIC PANEL
ALT: 78 U/L — ABNORMAL HIGH (ref 0–44)
AST: 49 U/L — ABNORMAL HIGH (ref 15–41)
Albumin: 4.2 g/dL (ref 3.5–5.0)
Alkaline Phosphatase: 87 U/L (ref 38–126)
Anion gap: 8 (ref 5–15)
BUN: 15 mg/dL (ref 8–23)
CO2: 24 mmol/L (ref 22–32)
Calcium: 9.1 mg/dL (ref 8.9–10.3)
Chloride: 106 mmol/L (ref 98–111)
Creatinine, Ser: 0.52 mg/dL (ref 0.44–1.00)
GFR, Estimated: >60 mL/min (ref >60)
Glucose, Bld: 156 mg/dL — ABNORMAL HIGH (ref 70–99)
Potassium: 3.2 mmol/L — ABNORMAL LOW (ref 3.5–5.1)
Sodium: 138 mmol/L (ref 135–145)
Total Bilirubin: 0.9 mg/dL (ref 0.3–1.2)
Total Protein: 7.2 g/dL (ref 6.5–8.1)

## 2021-06-10 LAB — LIPASE, BLOOD: Lipase: 27 U/L (ref 11–51)

## 2021-06-10 MED ORDER — IOHEXOL 9 MG/ML PO SOLN
500.0000 mL | ORAL | Status: AC
Start: 2021-06-10 — End: 2021-06-10
  Filled 2021-06-10 (×2): qty 500

## 2021-06-10 MED ORDER — OXYCODONE-ACETAMINOPHEN 5-325 MG PO TABS
1.0000 | ORAL_TABLET | Freq: Once | ORAL | Status: AC
  Administered 2021-06-10: 1 via ORAL
  Filled 2021-06-10: qty 1

## 2021-06-10 MED ORDER — LACTATED RINGERS IV BOLUS
1000.0000 mL | Freq: Once | INTRAVENOUS | Status: AC
  Administered 2021-06-10: 1000 mL via INTRAVENOUS

## 2021-06-10 MED ORDER — ONDANSETRON 4 MG PO TBDP: 4.0000 mg | ORAL_TABLET | Freq: Three times a day (TID) | ORAL | 0 refills | Status: DC | PRN

## 2021-06-10 MED ORDER — OXYCODONE-ACETAMINOPHEN 5-325 MG PO TABS: 1.0000 | ORAL_TABLET | ORAL | 0 refills | Status: DC | PRN

## 2021-06-10 MED ORDER — ONDANSETRON HCL 4 MG/2ML IJ SOLN
4.0000 mg | Freq: Once | INTRAMUSCULAR | Status: AC
  Administered 2021-06-10: 4 mg via INTRAVENOUS
  Filled 2021-06-10: qty 2

## 2021-06-10 MED ORDER — MORPHINE SULFATE (PF) 4 MG/ML IV SOLN
4.0000 mg | Freq: Once | INTRAVENOUS | Status: AC
  Administered 2021-06-10: 4 mg via INTRAVENOUS
  Filled 2021-06-10: qty 1

## 2021-06-10 MED ORDER — IOHEXOL 300 MG/ML  SOLN
100.0000 mL | Freq: Once | INTRAMUSCULAR | Status: AC | PRN
  Administered 2021-06-10: 100 mL via INTRAVENOUS
  Filled 2021-06-10: qty 100

## 2021-06-10 NOTE — ED Provider Notes (Addendum)
[facility] Emergency Department Provider Note   ____________________________________________   Event Date/Time   First MD Initiated Contact with Patient 06/10/21 0915     (approximate)  I have reviewed the triage vital signs and the nursing notes.   HISTORY  Chief Complaint Abdominal Pain    HPI [name] Robinson is a 70 y.o. female with past medical history of hypothyroidism and duodenal switch procedure who presents to the ED complaining of abdominal pain.  Patient reports that she had chili for dinner last night at [facility], a few hours later developed pain in her lower abdomen.  She states the pain affects both sides of her lower abdomen, has been constant and sharp, gradually worsening since onset.  She has been feeling nauseous with multiple episodes of vomiting, also describes multiple loose bowel movements since onset of pain, but denies diarrhea.  She has not noticed any blood in her emesis or stool.  She denies any fevers, dysuria, flank pain, chest pain, cough, or shortness of breath.  She has never had similar symptoms in the past, denies any complications with her duodenal switch procedure.        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/10/2021   Osteopenia of femoral neck 02/28/2021   Vitamin D deficiency 02/26/2021   Lipoma of left lower extremity 01/27/2021   Hashimoto's thyroiditis 01/22/2021   Anxiety 01/22/2021   OSA on CPAP 01/22/2021   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/16/2020   OAB (overactive bladder) 02/07/2013    Past Surgical History:  Procedure Laterality Date    ABDOMINAL HYSTERECTOMY     BARIATRIC SURGERY N/A 01/02/2020   duodenal switch   CERVICAL DISC SURGERY N/A    discectomy   COLONOSCOPY     ESOPHAGOGASTRODUODENOSCOPY  11/06/2020   EYE SURGERY Bilateral    laser procedure   LAPAROSCOPIC COLON RESECTION     MASS EXCISION Left 04/13/2021   Procedure: EXCISION MASS, left lateral thigh;  Surgeon: Ronny Bacon, MD;  Location: [facility] ORS;  Service: General;  Laterality: Left;   SHOULDER SURGERY Left    torn cartilege   TONSILLECTOMY N/A    TOTAL HIP ARTHROPLASTY Right 11/02/2020    Prior to Admission medications   Medication Sig Start Date End Date Taking? Authorizing Provider  ondansetron (ZOFRAN ODT) 4 MG disintegrating tablet Take 1 tablet (4 mg total) by mouth every 8 (eight) hours as needed for nausea or vomiting. 06/10/21  Yes Blake Divine, MD  oxyCODONE-acetaminophen (PERCOCET) 5-325 MG tablet Take 1 tablet by mouth every 4 (four) hours as needed for severe pain. 06/10/21 06/10/22 Yes Blake Divine, MD  citalopram (CELEXA) 20 MG tablet Take 1 tablet (20 mg total) by mouth daily. 01/27/21   Cannady, Henrine Screws T, NP  ergocalciferol (VITAMIN D2) 1.25 MG (50000 UT) capsule Take 1 capsule (50,000 Units total) by mouth once a week. Patient taking differently: Take 50,000 Units by mouth once a week. Monday 02/28/21   Marnee Guarneri T, NP  Ferrous Sulfate 28 MG TABS Take 28 mg by mouth daily with breakfast. Patient not taking: Reported on 06/09/2021  [provider]  furosemide (LASIX) 20 MG tablet Take 1 tablet (20 mg total) by mouth daily. Patient not taking: Reported on 06/09/2021 04/20/21   Jon Billings, NP  ibuprofen (ADVIL) 800 MG tablet Take 1 tablet (800 mg total) by mouth every 8 (eight) hours as needed. Patient not taking: Reported on 06/09/2021 04/13/21   Ronny Bacon, MD  levothyroxine (SYNTHROID) 137 MCG tablet Take 1 tablet (137 mcg total) by mouth daily before breakfast. 03/01/21   Cannady, Henrine Screws T, NP   lipase/protease/amylase (CREON) 12000-38000 units CPEP capsule Take 1 Units by mouth 3 (three) times daily before meals. Patient not taking: Reported on 06/09/2021    [provider]  vitamin B-12 (CYANOCOBALAMIN) 1000 MCG tablet Take 1,000 mcg by mouth daily.    [provider]    Allergies Solifenacin  Family History  Problem Relation Age of Onset   Thyroid disease Mother    Cancer - Lung Father     Social History Social History   Tobacco Use   Smoking status: Never   Smokeless tobacco: Never  Vaping Use   Vaping Use: Never used  Substance Use Topics   Alcohol use: Never   Drug use: Never    Review of Systems  Constitutional: No fever/chills Eyes: No visual changes. ENT: No sore throat. Cardiovascular: Denies chest pain. Respiratory: Denies shortness of breath. Gastrointestinal: Positive for abdominal pain, nausea, vomiting, and loose stool.  No constipation. Genitourinary: Negative for dysuria. Musculoskeletal: Negative for back pain. Skin: Negative for rash. Neurological: Negative for headaches, focal weakness or numbness.  ____________________________________________   PHYSICAL EXAM:  VITAL SIGNS: ED Triage Vitals  Enc Vitals Group     BP 06/10/21 0612 (!) 178/82     Pulse Rate 06/10/21 0612 66     Resp 06/10/21 0612 18     Temp 06/10/21 0612 97.9 F (36.6 C)     Temp Source 06/10/21 0612 Oral     SpO2 06/10/21 0612 100 %     Weight 06/10/21 0613 150 lb (68 kg)     Height 06/10/21 0613 5\' 7"  (1.702 m)     Head Circumference --      Peak Flow --      Pain Score 06/10/21 0613 10     Pain Loc --      Pain Edu? --      Excl. in [place]? --     Constitutional: Alert and oriented. Eyes: Conjunctivae are normal. Head: Atraumatic. Nose: No congestion/rhinnorhea. Mouth/Throat: Mucous membranes are moist. Neck: Normal ROM Cardiovascular: Normal rate, regular rhythm. Grossly normal heart sounds.  2+ radial pulses  bilaterally. Respiratory: Normal respiratory effort.  No retractions. Lungs CTAB. Gastrointestinal: Soft and diffusely tender to palpation, greatest in the bilateral lower quadrants with no rebound or guarding. No distention. Genitourinary: deferred Musculoskeletal: No lower extremity tenderness, 1+ pitting edema to knees bilaterally. Neurologic:  Normal speech and language. No gross focal neurologic deficits are appreciated. Skin:  Skin is warm, dry and intact. No rash noted. Psychiatric: Mood and affect are normal. Speech and behavior are normal.  ____________________________________________   LABS (all labs ordered are listed, but only abnormal results are displayed)  Labs Reviewed  CBC WITH DIFFERENTIAL/PLATELET - Abnormal; Notable for the following components:      Result Value   RBC 3.78 (*)    Hemoglobin 11.8 (*)    All other components within normal limits  COMPREHENSIVE METABOLIC PANEL - Abnormal; Notable for the following components:   Potassium 3.2 (*)  Glucose, Bld 156 (*)    AST 49 (*)    ALT 78 (*)    All other components within normal limits  URINALYSIS, ROUTINE W REFLEX MICROSCOPIC - Abnormal; Notable for the following components:   Color, Urine YELLOW (*)    APPearance HAZY (*)    Hgb urine dipstick SMALL (*)    Ketones, ur 5 (*)    All other components within normal limits  LIPASE, BLOOD    PROCEDURES  Procedure(s) performed (including Critical Care):  Procedures   ____________________________________________   INITIAL IMPRESSION / ASSESSMENT AND PLAN / ED COURSE      70 year old female with past medical history of hypothyroidism and duodenal switch procedure presents to the ED with worsening pain in her lower abdomen associated with vomiting and loose stool since last night.  Patient in significant discomfort on my evaluation with significant pain on palpation of her lower abdomen.  Labs thus far unremarkable, we will further assess with CT scan  with p.o. contrast given patient's history of bariatric surgery and need to rule out anastomotic leak.  We will treat symptomatically with IV morphine and Zofran, hydrate with IV fluids.  CT scan shows mildly dilated and thickened proximal bowel loops with no obvious obstruction but decompressed loops distally.  No evidence of bowel ischemia or internal hernia.  On reassessment, patient's pain is much improved following IV morphine and she is no longer vomiting following Zofran.  Case discussed with bariatric team at [facility] and no surgical intervention needed at this time. Patient was offered transfer, however she declines.  She prefers to trial oral pain medication and nausea medication at home, patient counseled extensively on bowel rest for the next couple of days.  She was counseled to return to the ED for new or worsening symptoms, patient agrees with plan.      ____________________________________________   FINAL CLINICAL IMPRESSION(S) / ED DIAGNOSES  Final diagnoses:  Ileus ([place])  Generalized abdominal pain  Bariatric surgery status     ED Discharge Orders          Ordered    ondansetron (ZOFRAN ODT) 4 MG disintegrating tablet  Every 8 hours PRN        06/10/21 1436    oxyCODONE-acetaminophen (PERCOCET) 5-325 MG tablet  Every 4 hours PRN        06/10/21 1436             Note:  This document was prepared using Dragon voice recognition software and may include unintentional dictation errors.    Blake Divine, MD 06/10/21 1438    Blake Divine, MD 06/10/21 [phone]

## 2021-06-10 NOTE — ED Notes (Signed)
Dc instructions and scripts reviewed with pt no questions or concerns at thsi time. Will follow up with provider. Wheeled to car.

## 2021-06-10 NOTE — ED Notes (Signed)
Called [facility] spoke with Vicente Males awaiting call back from Dr Melba Coon

## 2021-06-10 NOTE — ED Triage Notes (Signed)
Patient ambulatory to triage with steady gait, without difficulty or distress noted; pt reports mid lower abd pain since last night accomp by N/V

## 2021-06-12 ENCOUNTER — Encounter: Payer: Self-pay | Admitting: Cardiology

## 2021-06-12 NOTE — Assessment & Plan Note (Signed)
Most likely reason for nocturnal bradycardia is related to sleep disordered breathing.  She was not wearing her CPAP while in hospital postoperatively.  Would like to see what is now that she is back at home on CPAP.   Plan: Continues CPAP, check 7-day Zio patch monitor to determine heart rate

## 2021-06-12 NOTE — Assessment & Plan Note (Signed)
Left greater than right lower extremity edema.  She did have that surgery on the left leg and there could be some association with that.  The swelling is minimal on exam, and audible be worrisome.  She has no evidence of DVT. Marland Kitchen Plan:  She was told to liberalize salt intake and likely to back off on the salt.  Will check lower extremity venous reflux Dopplers to determine if there is any evidence of venous reflux.  Continue to recommend foot elevation along with support stockings while working.

## 2021-06-12 NOTE — Assessment & Plan Note (Signed)
Her heart rate is not dangerously low.  59 beats minute now.  There is been recordings in the 40s.  Need to determine what the true heart rate is.  She has a smart watch that helps, but I would like to see what the true rhythm is.  There may be some potential heart block associated with this. Plan: 7-day Zio patch monitor.  This will both show me bradycardia but also responsiveness to exercise/chronotropic competence level.

## 2021-06-14 ENCOUNTER — Ambulatory Visit (HOSPITAL_COMMUNITY)
Admission: RE | Admit: 2021-06-14 | Discharge: 2021-06-14 | Disposition: A | Discharge status: Home / Self Care | Payer: Medicare Other | Source: Ambulatory Visit | Attending: Cardiovascular Disease | Admitting: Cardiovascular Disease

## 2021-06-14 ENCOUNTER — Other Ambulatory Visit: Payer: Self-pay

## 2021-06-14 DIAGNOSIS — R600 Localized edema: Secondary | ICD-10-CM | POA: Diagnosis not present

## 2021-06-23 ENCOUNTER — Ambulatory Visit: Payer: Medicare Other | Admitting: Cardiology

## 2021-06-23 DIAGNOSIS — R001 Bradycardia, unspecified: Secondary | ICD-10-CM

## 2021-06-28 ENCOUNTER — Ambulatory Visit: Payer: Medicare Other | Admitting: Nurse Practitioner

## 2021-06-30 ENCOUNTER — Ambulatory Visit: Payer: Medicare Other | Admitting: Cardiology

## 2021-06-30 DIAGNOSIS — K5981 Ogilvie syndrome: Secondary | ICD-10-CM | POA: Insufficient documentation

## 2021-07-01 ENCOUNTER — Telehealth: Payer: Self-pay | Admitting: Nurse Practitioner

## 2021-07-01 NOTE — Telephone Encounter (Signed)
Pt's husband notified.

## 2021-07-01 NOTE — Telephone Encounter (Signed)
Patients husband called in needing to now when patient had flu and pnemuonia shot. Patient is in ICU and they are needing to know this info asap.

## 2021-07-29 DIAGNOSIS — I5181 Takotsubo syndrome: Secondary | ICD-10-CM | POA: Insufficient documentation

## 2021-08-19 ENCOUNTER — Telehealth: Payer: Self-pay | Admitting: Emergency Medicine

## 2021-08-19 NOTE — Telephone Encounter (Signed)
Called patient to go over results of long term monitor. Pt verbalized understanding.   Pt reports that she is currently admitted to [facility] due to complications related to prior gastric bypass. Now has trach. Offered support to patient and told her that she could call our office after discharge if there is anything that we can do for her.   Pt voiced appreciation for call.

## 2021-08-19 NOTE — Telephone Encounter (Signed)
-----   Message from Leonie Man, MD sent at 08/19/2021  1:36 PM EST ----- Monitor results  Narrative & Impression   Overall predominant rhythm was sinus rhythm: Heart rate range 41-129 bpm with an average of 62 bpm.  Slowest heart rate spells were during hours of sleep  3 short atrial runs: Fastest was 6 beats at a max rate of 169 bpm, also the longest. All 3 episodes were not noted on monitor diary  No sustained arrhythmias either fast or slow. No significant pauses.  Rare isolated PACs and PVCs.    Overall pretty benign study.   Glenetta Hew, MD

## 2021-08-22 ENCOUNTER — Ambulatory Visit: Payer: Medicare Other | Admitting: Nurse Practitioner

## 2021-08-26 ENCOUNTER — Telehealth: Payer: Self-pay | Admitting: Nurse Practitioner

## 2021-08-26 NOTE — Telephone Encounter (Signed)
Noted, will review chart and see notes.

## 2021-08-26 NOTE — Telephone Encounter (Signed)
Copied from [place] [id number]. Topic: Appointment Scheduling - Scheduling Inquiry for Clinic >> Aug 26, 2021  3:50 PM [name] Robinson D wrote: Reason for CRM: Pt's husband called to cancel appt for next week.  He said his wife had been in ICU at [facility] for about 2 months with complications for the gastro be pass surgery that she had last year.  She is hoping to go into rehab soon.  They will cal and get back with Jolene when she is better.

## 2021-08-31 ENCOUNTER — Ambulatory Visit: Payer: Medicare Other | Admitting: Nurse Practitioner

## 2021-11-08 ENCOUNTER — Ambulatory Visit: Payer: Self-pay

## 2021-11-08 NOTE — Telephone Encounter (Signed)
Reason for Disposition ?? [1] NPWT AND [2] dressing came off wound ? ?Answer Assessment - Initial Assessment Questions ?1. SYMPTOM or QUESTION: "What is your reason for calling today?" or "How can I best help you?" (e.g., bleeding, redness, drainage, pain) ?    Dressing for wound came off ?2. WOUND APPEARANCE: "What does the wound look like?" "Is there new or spreading redness?" If Yes, ask: "What is the size of the red area?" (Inches, centimeters, or compare to size of a coin).  "Has the drainage changed or increased?"  "Is there a new odor?"   ?     ?3. ONSET: "When did the problem begin?" ?    1 hour ago ?4. LOCATION: "Where is the wound(s)?" (e.g., , ankle, lower left leg) ?     ?5. BLEEDING: "Are you having a problem with bleeding?"  (e.g., amount, timing) ?     ?6. PAIN: "Is there any pain?" If Yes, ask: "How bad is the pain?" (Scale 1-10; or mild, moderate, severe) ?     ?7. FEVER: "Do you have a fever?" If Yes, ask: "What is your temperature, how was it measured, and when did it start?" ?     ?8. OTHER SYMPTOMS: "Do you have any other symptoms?" (e.g., chills, rash elsewhere, new weakness) ?     ?9. TREATMENT: "How have you been treating the wound?" ?     ?10. NEGATIVE PRESSURE WOUND THERAPY  (NPWT): "What concerns do you have about your negative pressure dressing?"  "When was your last dressing change?"  "Are you getting a device alert or alarm?"  "What have you done to try to fix the problem?"    ?       ?11. PREGNANCY: "Is there any chance you are pregnant?" "When was your last menstrual period? ? ?Protocols used: Wound - Chronic and Negative Pressure Wound Therapy-A-AH ? ?

## 2021-11-08 NOTE — Telephone Encounter (Signed)
?  Chief Complaint: Pt was released from the hospital today and now Wound vac dressing came off ?Symptoms: Dressing is off ?Frequency:  1 hour ago ?Pertinent Negatives: Patient denies  ?Disposition: '[]'$ ED /'[x]'$ Urgent Care (no appt availability in office) / '[]'$ Appointment(In office/virtual)/ '[]'$  [place] [facility]/ '[]'$ Home Care/ '[]'$ Refused Recommended Disposition /'[]'$ [place] [facility]/ '[]'$  Follow-up with PCP ?Additional Notes: Pt has called wound doctor. Per pt the docotor told her to leave it off. Pt has seen home health nurse. Advised pt to call home health nurse for further instructions.  ?

## 2021-11-08 NOTE — Telephone Encounter (Signed)
?  Chief Complaint: Anxiety - hospital release today. Symptoms: Sounds very anxious ?Frequency: Since hospital discharge ?Pertinent Negatives: Patient denies  ?Disposition: '[]'$ ED /'[]'$ Urgent Care (no appt availability in office) / '[x]'$ Appointment(In office/virtual)/ '[]'$  [place] [facility]/ '[]'$ Home Care/ '[]'$ Refused Recommended Disposition /'[]'$ [place] [facility]/ '[]'$  Follow-up with PCP ?Additional Notes: Pt was released form the hospital today. She was given discharge papers and  a lot of new medications that she needs help with. Pt was upset because she was told by agent that she could not be seen until April, which increased her anxiety. She is ok with being seen 11/09/2021 at 4pm. She said she will take the medication given to her by hospital doctor tonight - that will help her sleep. Spoke with [facility], she will see if an earlier appt becomes available and contact pt.  ? ? ? ?Reason for Disposition ? [1] Symptoms of anxiety or panic AND [2] has not been evaluated for this by physician ? ?Answer Assessment - Initial Assessment Questions ?1. CONCERN: "Did anything happen that prompted you to call today?"  ?    Released form hospital ?2. ANXIETY SYMPTOMS: "Can you describe how you (your loved one; patient) have been feeling?" (e.g., tense, restless, panicky, anxious, keyed up, overwhelmed, sense of impending doom).  ?    overwhelmed ?3. ONSET: "How long have you been feeling this way?" (e.g., hours, days, weeks) ?    *No Answer* ?4. SEVERITY: "How would you rate the level of anxiety?" (e.g., 0 - 10; or mild, moderate, severe). ?    Sounds like 10/10 ?5. FUNCTIONAL IMPAIRMENT: "How have these feelings affected your ability to do daily activities?" "Have you had more difficulty than usual doing your normal daily activities?" (e.g., getting better, same, worse; self-care, school, work, interactions) ?    *No Answer* ?6. HISTORY: "Have you felt this way before?" "Have you ever been diagnosed with an anxiety problem in  the past?" (e.g., generalized anxiety disorder, panic attacks, PTSD). If Yes, ask: "How was this problem treated?" (e.g., medicines, counseling, etc.) ?    no ?7. RISK OF HARM - SUICIDAL IDEATION: "Do you ever have thoughts of hurting or killing yourself?" If Yes, ask:  "Do you have these feelings now?" "Do you have a plan on how you would do this?" ?    no ?8. TREATMENT:  "What has been done so far to treat this anxiety?" (e.g., medicines, relaxation strategies). "What has helped?" ?    *No Answer* ?9. TREATMENT - THERAPIST: "Do you have a counselor or therapist? Name?" ?    *No Answer* ?10. POTENTIAL TRIGGERS: "Do you drink caffeinated beverages (e.g., coffee, colas, teas), and how much daily?" "Do you drink alcohol or use any drugs?" "Have you started any new medicines recently?" ?    *No Answer* ?10. PATIENT SUPPORT: "Who is with you now?" "Who do you live with?" "Do you have family or friends who you can talk to?"  ?      *No Answer* ?11. OTHER SYMPTOMS: "Do you have any other symptoms?" (e.g., feeling depressed, trouble concentrating, trouble sleeping, trouble breathing, palpitations or fast heartbeat, chest pain, sweating, nausea, or diarrhea) ?      *No Answer* ?12. PREGNANCY: "Is there any chance you are pregnant?" "When was your last menstrual period?" ?      *No Answer* ? ?Protocols used: Anxiety and Panic Attack-A-AH ? ?

## 2021-11-09 ENCOUNTER — Encounter: Payer: Self-pay | Admitting: Nurse Practitioner

## 2021-11-09 ENCOUNTER — Ambulatory Visit (INDEPENDENT_AMBULATORY_CARE_PROVIDER_SITE_OTHER): Payer: Medicare Other | Admitting: Nurse Practitioner

## 2021-11-09 VITALS — BP 103/66 | HR 94 | Temp 97.5°F | Resp 18 | Ht 67.0 in | Wt 154.0 lb

## 2021-11-09 DIAGNOSIS — R7989 Other specified abnormal findings of blood chemistry: Secondary | ICD-10-CM

## 2021-11-09 DIAGNOSIS — A419 Sepsis, unspecified organism: Secondary | ICD-10-CM | POA: Insufficient documentation

## 2021-11-09 DIAGNOSIS — R29898 Other symptoms and signs involving the musculoskeletal system: Secondary | ICD-10-CM | POA: Insufficient documentation

## 2021-11-09 DIAGNOSIS — D649 Anemia, unspecified: Secondary | ICD-10-CM

## 2021-11-09 DIAGNOSIS — F419 Anxiety disorder, unspecified: Secondary | ICD-10-CM

## 2021-11-09 DIAGNOSIS — Z4689 Encounter for fitting and adjustment of other specified devices: Secondary | ICD-10-CM | POA: Insufficient documentation

## 2021-11-09 DIAGNOSIS — L98422 Non-pressure chronic ulcer of back with fat layer exposed: Secondary | ICD-10-CM | POA: Diagnosis not present

## 2021-11-09 DIAGNOSIS — I5181 Takotsubo syndrome: Secondary | ICD-10-CM

## 2021-11-09 DIAGNOSIS — Z95828 Presence of other vascular implants and grafts: Secondary | ICD-10-CM | POA: Insufficient documentation

## 2021-11-09 DIAGNOSIS — Z872 Personal history of diseases of the skin and subcutaneous tissue: Secondary | ICD-10-CM | POA: Insufficient documentation

## 2021-11-09 DIAGNOSIS — R6521 Severe sepsis with septic shock: Secondary | ICD-10-CM

## 2021-11-09 DIAGNOSIS — E063 Autoimmune thyroiditis: Secondary | ICD-10-CM

## 2021-11-09 DIAGNOSIS — D510 Vitamin B12 deficiency anemia due to intrinsic factor deficiency: Secondary | ICD-10-CM | POA: Insufficient documentation

## 2021-11-09 DIAGNOSIS — Z8619 Personal history of other infectious and parasitic diseases: Secondary | ICD-10-CM | POA: Insufficient documentation

## 2021-11-09 DIAGNOSIS — E559 Vitamin D deficiency, unspecified: Secondary | ICD-10-CM

## 2021-11-09 DIAGNOSIS — E440 Moderate protein-calorie malnutrition: Secondary | ICD-10-CM | POA: Insufficient documentation

## 2021-11-09 DIAGNOSIS — Z934 Other artificial openings of gastrointestinal tract status: Secondary | ICD-10-CM | POA: Insufficient documentation

## 2021-11-09 DIAGNOSIS — E538 Deficiency of other specified B group vitamins: Secondary | ICD-10-CM | POA: Insufficient documentation

## 2021-11-09 DIAGNOSIS — D508 Other iron deficiency anemias: Secondary | ICD-10-CM

## 2021-11-09 MED ORDER — BUSPIRONE HCL 5 MG PO TABS: 5.0000 mg | ORAL_TABLET | Freq: Two times a day (BID) | ORAL | 4 refills | Status: DC

## 2021-11-09 MED ORDER — MIRTAZAPINE 7.5 MG PO TABS: 7.5000 mg | ORAL_TABLET | Freq: Every day | ORAL | 4 refills | Status: DC

## 2021-11-09 NOTE — Assessment & Plan Note (Signed)
With wound vac in place, however not attached to machine and no supplies sent home with patient.  Attempted to reattach tubing today, but adherent not sticking.  Will work on getting supplies ASAP + referral to wound care urgent.  New referral to home health placed, as in urgent need of wound care nurse in home.  Discussed at length with patient and husband. ?

## 2021-11-09 NOTE — Assessment & Plan Note (Signed)
Placed in hospital 07/22/21, remains in place at this time.  Followed by bariatric surgery at [facility], Dr. Melba Coon, continue this collaboration. ?

## 2021-11-09 NOTE — Assessment & Plan Note (Signed)
Chronic, exacerbated by recent 4 months in hospital and serious illness.  At this time she is on Fentanyl patch for pain and was sent home with Klonopin, concern for this and opioid, especially with her current weakness and recent respiratory distress.  At this time discontinue Klonopin.  Restart Mirtazapine, which she was on in hospital for sleep, this would also benefit weight and appetite -- start low 7.5 MG nightly.  Continue Duloxetine as ordered in hospital.  Start Buspar 5 MG BID for anxiety.  Denies SI/HI.  Palliative referral placed.  Return in one week. ?

## 2021-11-09 NOTE — Assessment & Plan Note (Signed)
Home health referral placed and urgent referral to wound care.  Will work on obtaining supplies ASAP. ?

## 2021-11-09 NOTE — Assessment & Plan Note (Addendum)
History of low levels with osteopenia noted on DEXA April 2021, continue weekly supplement.  Refills sent and check level future visit. ?

## 2021-11-09 NOTE — Assessment & Plan Note (Addendum)
Acute and improving.  Hospitalized for 4 months at [facility], has deconditioned since this time.  Monitor closely.  CBC and CMP today.  Palliative referral placed.  Home health ordered, recent group did not return. ?

## 2021-11-09 NOTE — Assessment & Plan Note (Signed)
Chronic, ongoing.  Check level today and start supplement if low levels remain. ?

## 2021-11-09 NOTE — Assessment & Plan Note (Addendum)
Refer to protein calorie malnutrition plan of care.  Home health for PT/OT ordered. ?

## 2021-11-09 NOTE — Assessment & Plan Note (Signed)
Chronic, ongoing.  Continue current medication regimen and adjust as needed.  TSH today. ?

## 2021-11-09 NOTE — Progress Notes (Signed)
? ?BP 103/66   Pulse 94   Temp (!) 97.5 ?F (36.4 ?C) (Oral)   Resp 18   Ht '5\' 7"'$  (1.702 m)   Wt 154 lb (69.9 kg)   SpO2 98%   BMI 24.12 kg/m?   ? ?Subjective:  ? ? Patient ID: [name] Robinson, female    DOB: [birth year], 71 y.o.   MRN: [id number] ? ?HPI: ?[name] Robinson is a 71 y.o. female ? ?Chief Complaint  ?Patient presents with  ? Hospitalization Follow-up  ?  Other specified intestinal malabsorption.  ? Anxiety  ? ?Transition of Care Hospital Follow up.  ?Admitted for septic shock at [facility] on 06/27/21.  Admitted for 4 months total.  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 Was discharged yesterday, 11/07/21.  During hospitalization went through multiple procedures including laparoscopic surgery, tracheostomy, bronchoscopy, EGD with stent.  Has wound vac in place at this time for sacral ulcer. ? ?Was sent home to have PT/OT + PICC line for TPN, but was told to disconnect this yesterday by Dr. Melba Coon.  Home health was ordered, but they report this was cancelled by nurse who came in the other day due to nurse disagreeing with plan and stating patient should have been in rehab facility == [facility] home [facility].  Patient and spouse report there currently is no assistance in home + they have no wound vac supplies.  Wound vac tube became disconnected and they have been unable to reattach.  Has J Tube that remains in place. ? ?At this time she is eating oatmeal and two Boost + other meals daily.  Continues to feel very weak and exhausted.  Is struggling with anxiety due to length of stay in hospital and major illness with this.  Was sent home with Fentanyl patches for pain and Klonopin with anxiety. ? ?"Admit Date: 06/27/2021 ?Discharge Date: 11/07/2021 ?Discharge Provider: Kathalene Frames, PA-C on behalf of Dr. Melba Coon ? ?Consults: Dietitian, [place], Infectious Disease, Pharmacy, Nephrology, Gastroenterology, Vascular Access  Nurse, Hematology/Oncology, Cardiology, General Surgery (trach), WOCN, Palliative Care, Urology, Hospitalist, Orthopedic Surgery, Case Management, and Rehab Admission ? 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 ?Abdominal/GI Procedures this Admission: ?- 06/27/21 Exploratory Laparoscopy, LOA, Small Bowel Resection x3 with Gastric Bypass  ?- 06/28/21 Ex lap with washout, drain placement, small bowel resection x2, gastrectomy, closure of enterotomy ?- 06/30/21 Ex lap with washout, wound vac application ?- 65/68/12 Ex lap with washout and Closure of Abdominal Incision  ?- 07/17/21 Ex lap with washout w/ placement of 19 Fr blake drain (later removed 1/11) ?- 07/22/21 Ex lap with washout, small bowel resection, jejunostomy tube insertion, closure of enterotomy ?- 07/27/21 EGD, metal stent placed across [place] anastomosis (Dr. Albertine Grates with GI) ?- 12/16 CT guided drainage of right perihepatic and left retroperitoneal fluid collections with purulent fluid. 99F pigtail catheters placed bilaterally. Cultures positive for heavy lactobacillus and E coli. (right pigtail drain removed 1/13) ?- 1/19 EGD with stent repositioning ?- 1/25 EGD with stent placement x 2 - Removed prior stent and new 15 cm x 18 mm stent was placed over with distal tip jejunum and proximal end in stomach that was clipped to prevent migration. Second stent placed inside the for stent to help seal location of the leak. Proximal end of second stent at esophagus at 30 cm also clipped to keep in place.  ?-  2/6 IR for 8 Fr right pigtail drain placement - removed 3/23  ?- 2/21 EGD with stent and suture removal ? ?Lines ?- 07/22/21 J tube insertion ?- 08/01/21 R chest dual-lumen tunneled [place] placed via IR (removed 1/16) ?- 08/17/21 IR replacement of J-tube (12 Fr) ?- 2/10 PICC placed LUE ? ?HPI: [name] Robinson is a 71 y.o. 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 Melba Coon) who was doing well but weight loss persisted and malabsorptive syndrome was difficult to manage with medications. She was revised 04/15/2021 but has experienced recurrent colicky pain and presented to [facility] ER 10/28 for abd pain, nausea, vomiting, and loose BMs. CT abdomen unrevealing. At follow-up visit with Dr. Melba Coon, the decision was made to proceed with planned laparoscopic exploration with possible reduction and repair of internal hernia with lysis of adhesions. ? ?Hospital Course: The patient presented on 06/27/2021 for the above mentioned procedure. Operative findings included tight adhesions that were compressing the bowel down by the bladder. Common channel approximately 200 cm. 100 cm redundant loop. 100 cm BP limb. The decision was made to rebuild the duodenal switch. She underwent LYSIS OF ADHESIONS, EXPLORATION, PARTIAL SMALL BOWEL RESECTION X3 WITH GASTRIC BYPASS leaving a 330 cm common channel with 2 new anastomoses and a new GJ anastomosis. She tolerated the procedure well without any noted complications. Please see Op Note for full details. She was transferred to PACU and subsequently to the floor when deemed appropriate. ? ?On the morning of 11/15, UGI showed a leak at the gastrojejunostomy. A rapid response was called for hypotension. Patient was given fluids and Zosyn per sepsis protocol and went to the OR for repair of leak. She was initially extubated postoperatively. ? ?Hx duodenal switch (2021) s/p revision to one anastomosis GBP (06/27/2021) ?GJ Anastomotic leak, bile peritonitis - resolved ?See abdominal procedures above ?-12/12: Blue food dye given via NGT later found in JP drain ?-12/26 CT shows persistent multiple intraabdominal abscesses. GJ stent in proper positioning ?-12/28 Blue dye via J-tube confirmed no leak from J-tube feeds ?-1/25 EGD: Defect at [place] slightly larger with dehiscence and pus ?-1/27 UGI: initial review of UGI indicated possible leak,  later deemed to be intraluminal.  ?-2/3 CT showed more air than prior scan and stent higher than prior scan, demonstrating likely continued leak at [place]. ?-2/8 Blue dye test without evidence of leak ?-2/13 UGI: Contrast seen along the expected location of the GJ anastomosis, suggestive of a persistent leak.  ?-2/21 EGD: Anastomosis defect no longer seen. Large clean based ulcer identified at the previous location of the gastric wall defect with surgical suture material.  ?-2/28 CT Interval placement of a pigtail drain within the thick-walled rim-enhancing fluid collection the right hemiabdomen. The size of this component is unchanged. Thick-walled tract extends from the fluid collection across the midline and terminates in a smaller focal collection which has decreased in size. Small thick-walled rim-enhancing fluid collection in the left hemipelvis is slightly smaller.  ? ?Critical illness myopathy ?Severe deconditioning ?Severe protein calorie malnutrition ?Diarrhea - resolved ?TPN started while in ICU. Eventually Peptamen trickle feeds started and advanced to goal which resulted in liquid stools requiring long term rectal tube for fecal management. 1/9 Passed FEES, but blue dye seen via right upper abdomen surgical drain. Kept strict NPO and TPN weaned off with TF to goal. Trialed sandostatin 1/13-3/10 which had no notable effect on liquid stool. Was receiving Banatrol TID and Imodium QID at one point and then  Lomotil. Cdiff negative. Persistent vitamin deficiencies with tube feeds at goal and unable to control liquid stooling so patient transitioned back to TPN. Liquid stools resolved and rectal tube discontinued. Trialed TF again on 3/14 but stopped again on 3/19 due to increased abdominal pain, nausea, and reportedly loose stools. Patient however able to tolerate full liquid diet. Transitioned to pureed foods 3/16 and regular diet 3/19 but backed down to mechanical soft 3/20. Resumed regular low fiber diet once  again on 3/22 and patient is now quickly advancing to goal with oral intake. Plan is to continue TPN to optimize nutritional status while patient works towards optimal oral intake. Stools are now solid. Keep J tub

## 2021-11-09 NOTE — Assessment & Plan Note (Signed)
To left arm, no being used at this time.  Was planned for TPN, but this was not started at home.  Will place new home health referral to work on care of this at home. ?

## 2021-11-09 NOTE — Assessment & Plan Note (Signed)
Resolved in hospital.  Followed by cardiology locally in past, will have her return in future as needed. ?

## 2021-11-09 NOTE — Patient Instructions (Signed)
Negative Pressure Wound Therapy Home Guide ?Negative pressure wound therapy (NPWT) uses a sponge or foam-like material (dressing) placed on or inside the wound. The wound is then covered and sealed with a cover dressing that sticks to your skin (is adhesive) to keep air out. A tube is attached to the cover dressing, and this tube connects to a small pump. The pump removes drainage from the wound. ?NPWT helps to increase blood flow to the wound and heal it from the inside. NPWT also helps pull the edges of the wound together and removes fluids and germs from the wound. NPWT may also be called wound vac. ?What are the risks? ?NPWT is usually safe to use. However, problems can occur, including: ?Skin irritation from the dressing adhesive. ?Bleeding. ?Infection. Signs of infection include: ?More redness, swelling, or pain. ?More fluid or blood. ?Warmth or hardness around the wound. ?Pus or a bad smell. ?Red streaks leading from the wound. ?Dehydration. Wounds with large amounts of drainage can cause excessive body fluid loss. ?Pain. ?Supplies needed: ?Wound cleanser or salt-water solution (saline). ?Skin protectant. This may be a wipe, film, or spray. ?Clean or germ-free (sterile) scissors. ?New sponge or foam. ?Cover dressing. ?Gauze pad. ?Tape. ?Also have available: ?Soap and water, or hand sanitizer. ?Disposable gloves. ?Eye protection. ?A disposable garbage bag. ?Protective clothing. ?How to change your dressing ?Change the dressing as scheduled, if the dressing loses suction, or the pump is off for 2 hours or more. ?Prepare to change your dressing ? ?Take pain medicine 30 minutes before changing the dressing if told by your health care provider. ?Wash your hands with soap and water for at least 20 seconds before you change the dressing. If soap and water are not available, use hand sanitizer ?Dry your hands with a clean towel. ?Set up a clean station for wound care and set a plastic bag on or near your work surface  for trash. ?Open the dressing package so that the sponge dressing remains on the inside of the package. ?Wear gloves, protective clothing, and eye protection. ?Remove the old dressing ? ?Turn off the pump and disconnect the tubing from the dressing. ?Carefully remove the adhesive cover dressing in the direction of your hair growth. ?Remove the sponge dressing that is inside the wound. If the sponge sticks, use a wound cleanser or saline solution to wet the sponge and help it come off more easily. ?Throw the old sponge and cover dressing supplies into the garbage bag. ?Check the wound for signs of infection, including a bad smell, drainage (bleeding or pus), or new color changes (black, grey, yellow, or white) in the wound. ?Remove your gloves by grabbing the cuff and turning the glove inside out. Place the gloves in the trash immediately. ?Wash your hands with soap and water for at least 20 seconds. If soap and water are not available, use hand sanitizer. ?Dry your hands with a clean towel. ?Clean your wound ?Wear gloves, protective clothing, and eye protection. Follow your health care provider's instructions on how to clean your wound. You may be told to: ?Clean the wound using a saline solution or a wound cleanser and a clean gauze pad. ?Pat the wound dry with a gauze pad. Do not rub the wound. ?Throw the gauze pad into the garbage bag. ?Remove your gloves by grabbing the cuff and turning the glove inside out. Place the gloves in the trash immediately. ?Wash your hands with soap and water for at least 20 seconds. If soap and  water are not available, use hand sanitizer ?Dry your hands with a clean towel. ?Apply the new dressing ?Wear gloves, protective clothing, and eye protection. ?If told by your health care provider, apply a skin protectant to any skin that will be exposed to adhesive. Let the skin protectant dry. ?Cut a piece of new sponge dressing and put it on or in the wound. ?Using clean scissors, cut a  nickel-sized hole in the new cover dressing. ?Apply the cover dressing. ?Attach the suction tube over the hole in the cover dressing. ?Take off your gloves. Put them in the plastic bag with the old dressing. Tie the bag shut and throw it away. ?Wash your hands with soap and water for at least 20 seconds. If soap and water are not available, use hand sanitizer. ?Dry your hands with a clean towel. ?Turn the pump back on. The sponge dressing should collapse. Do not change the settings on the machine without talking to a health care provider. ?Replace the container in the pump that collects fluid if it is full. Replace the container per the manufacturer's instructions or at least once a week, even if it is not full. ?Follow your health care provider'sinstructions on how often you need to change and apply the new NPWT dressing. ?General tips and recommendations ?If the alarm sounds: ?Stay calm and prepare to troubleshoot. ?Do not turn off the pump or do anything with the dressing. ?The alarm may go off for many reasons, including: ?The battery is low. Change the battery or plug the device into electrical power. ?The dressing has a leak. Find the leak and put tape over the leak. ?The fluid collection container is full. Change the fluid container. ?Call your health care provider right away if you cannot fix the problem. ?Explain to your health care provider what is happening. Follow his or her instructions. ?General instructions ?Change the dressing as scheduled, if the dressing loses suction, or the pump is off for 2 hours or more. ?Do not turn off the pump unless told to do so by your health care provider. ?Do not turn off the pump for more than 2 hours. If the pump is off or you lose suction to the dressing for more than 2 hours, the dressing will need to be changed. ?Check the machine frequently to make sure that therapy is on and that all clamps are open. ?Do not use over-the-counter medicated or antiseptic creams,  sprays, liquids, or dressings unless told by your health care provider. ?Do not take baths, swim, or use a hot tub until your health care provider approves. You may only be allowed to take sponge baths. Ask your health care provider if you may take showers. If your health care provider says it is okay to shower: ?Do not take the pump into the shower. ?Make sure the wound dressing is protected and sealed. The wound dressing must stay dry. ?Contact a health care provider if: ?You have new pain. ?You develop irritation, a rash, or itching around the wound or dressing. ?You see new color changes (black, grey, yellow, or white) in the wound. ?The dressing changes are painful or cause bleeding. ?The pump has been off for more than 2 hours, and you do not know how to change the dressing. ?The pump alarm goes off, and you do not know what to do. ?Get help right away if: ?You have a lot of bleeding. ?The wound breaks open. ?You have severe pain. ?You have signs of infection, such as: ?  More redness, swelling, or pain. ?More fluid or blood. ?Warmth or hardness. ?Pus or a bad smell. ?Red streaks leading from the wound. ?A fever. ?You see a sudden change in the color or texture of the drainage. ?You have signs of dehydration, such as: ?Little or no tears, urine, or sweat. ?Muscle cramps. ?Very dry mouth. ?Headache. ?Dizziness or confusion. ?Summary ?NPWT uses a sponge or dressing placed on or inside the wound. ?NPWT helps to increase blood flow to the wound and heal it from the inside. ?Follow your health care provider's instructions on how to clean your wound and how to change the dressing. ?Contact a health care provider if you have new pain, an irritation, or a rash, or if the alarm goes off and you do not know what to do. ?Get help right away if you have a lot of bleeding, your wound breaks open, or you have severe pain. Also, get help if you have signs of infection. ?This information is not intended to replace advice given  to you by your health care provider. Make sure you discuss any questions you have with your health care provider. ?Document Revised: 12/27/2020 Document Reviewed: 12/27/2020 ?Elsevier Patient Education ?

## 2021-11-09 NOTE — Telephone Encounter (Signed)
Pt scheduled today

## 2021-11-09 NOTE — Assessment & Plan Note (Signed)
Present since bariatric surgery, exacerbated with recent hospitalization.  Restart supplements at home.  Recheck labs today.  Will plan to get her into hematology locally if ongoing as may benefit from infusions, she has had in past. ?

## 2021-11-09 NOTE — Assessment & Plan Note (Addendum)
Related to recent 4 month hospitalization and deconditioning.  Recommend increasing Boost supplements to 3-4 times daily.  Ensure eating frequent, small, healthy meals.  Check labs today. ?

## 2021-11-10 ENCOUNTER — Telehealth: Payer: Self-pay | Admitting: Nurse Practitioner

## 2021-11-10 ENCOUNTER — Telehealth: Payer: Self-pay | Admitting: Primary Care

## 2021-11-10 LAB — CBC WITH DIFFERENTIAL/PLATELET
Basophils Absolute: 0 10*3/uL (ref 0.0–0.2)
Basos: 0 %
EOS (ABSOLUTE): 0 10*3/uL (ref 0.0–0.4)
Eos: 1 %
Hematocrit: 30.3 % — ABNORMAL LOW (ref 34.0–46.6)
Hemoglobin: 10 g/dL — ABNORMAL LOW (ref 11.1–15.9)
Immature Grans (Abs): 0 10*3/uL (ref 0.0–0.1)
Immature Granulocytes: 0 %
Lymphocytes Absolute: 0.8 10*3/uL (ref 0.7–3.1)
Lymphs: 18 %
MCH: 29.8 pg (ref 26.6–33.0)
MCHC: 33 g/dL (ref 31.5–35.7)
MCV: 90 fL (ref 79–97)
Monocytes Absolute: 0.3 10*3/uL (ref 0.1–0.9)
Monocytes: 7 %
Neutrophils Absolute: 3.5 10*3/uL (ref 1.4–7.0)
Neutrophils: 74 %
Platelets: 168 10*3/uL (ref 150–450)
RBC: 3.36 x10E6/uL — ABNORMAL LOW (ref 3.77–5.28)
RDW: 12.8 % (ref 11.7–15.4)
WBC: 4.6 10*3/uL (ref 3.4–10.8)

## 2021-11-10 LAB — COMPREHENSIVE METABOLIC PANEL
ALT: 15 IU/L (ref 0–32)
AST: 26 IU/L (ref 0–40)
Albumin/Globulin Ratio: 1.1 — ABNORMAL LOW (ref 1.2–2.2)
Albumin: 3.7 g/dL — ABNORMAL LOW (ref 3.8–4.8)
Alkaline Phosphatase: 92 IU/L (ref 44–121)
BUN/Creatinine Ratio: 47 — ABNORMAL HIGH (ref 12–28)
BUN: 18 mg/dL (ref 8–27)
Bilirubin Total: 0.2 mg/dL (ref 0.0–1.2)
CO2: 26 mmol/L (ref 20–29)
Calcium: 9.7 mg/dL (ref 8.7–10.3)
Chloride: 101 mmol/L (ref 96–106)
Creatinine, Ser: 0.38 mg/dL — ABNORMAL LOW (ref 0.57–1.00)
Globulin, Total: 3.4 g/dL (ref 1.5–4.5)
Glucose: 96 mg/dL (ref 70–99)
Potassium: 4.1 mmol/L (ref 3.5–5.2)
Sodium: 142 mmol/L (ref 134–144)
Total Protein: 7.1 g/dL (ref 6.0–8.5)
eGFR: 109 mL/min/{1.73_m2} (ref >59)

## 2021-11-10 LAB — FERRITIN: Ferritin: 2223 ng/mL — ABNORMAL HIGH (ref 15–150)

## 2021-11-10 LAB — VITAMIN B12: Vitamin B-12: 888 pg/mL (ref 232–1245)

## 2021-11-10 LAB — IRON AND TIBC
Iron Saturation: 19 % (ref 15–55)
Iron: 30 ug/dL (ref 27–139)
Total Iron Binding Capacity: 159 ug/dL — ABNORMAL LOW (ref 250–450)
UIBC: 129 ug/dL (ref 118–369)

## 2021-11-10 LAB — TSH: TSH: 4.22 u[IU]/mL (ref 0.450–4.500)

## 2021-11-10 NOTE — Telephone Encounter (Signed)
Spoke with surgeon, Dr. Melba Coon, on telephone and updated him on PCP orders.  Made him aware home health has been ordered and may be starting tomorrow + wound vac supplies and wound care referral in place.  Discussed PICC line, this is not being used as patient is eating -- can be removed by home health nurse.  J-tube to remain at this time and monitored + wound vac care.  Dr. Melba Coon == [phone] ?

## 2021-11-10 NOTE — Addendum Note (Signed)
Addended by: Marnee Guarneri T on: 11/10/2021 06:08 PM ? ? Modules accepted: Orders ? ?

## 2021-11-10 NOTE — Telephone Encounter (Signed)
Spoke with patient's husband [name] Robinson, regarding the Palliative referral/services and he was in agreement with scheduling visit.  I have scheduled an In-home Consult for 11/15/21 @ 9 AM ?

## 2021-11-10 NOTE — Telephone Encounter (Signed)
Copied from [place]. Topic: General - Other ?>> Nov 10, 2021 11:30 AM Alanda Slim E wrote: ?Reason for CRM: Pt received a call from wound care and they cant see pt until 4.14.23/ it also sounds like its not for in home care / Pt asked to speak with Jolene today asap / they wanted the wound care today or asap and thought a rush would have been put on this / please advise ?

## 2021-11-10 NOTE — Progress Notes (Signed)
Contacted via [place] -- please alert them on where wound vac supplies were sent + ensure they received below message.   ?Good evening [name] Robinson, your labs have returned: ?- CBC continues to show a lower hemoglobin and hematocrit.  Iron level is 30, low level normal and ferritin is very high.  Due to your recent septic shock, I would like to get you into hematology to further assess this, especially the ferritin.  This could be elevated due to ongoing inflammation. ?- Thyroid level normal and B12 normal. ?- Kidney function, creatinine and eGFR, remains normal, as is liver function, AST and ALT.  ?We are working on home health to get in ASAP.  They are unable to get nursing in tomorrow, but at this time looks like they will be able to get in on Saturday.  I have ordered all wound vac supplies and my nurse has faxed to a local medical supply to see if can be delivered.  I will have referral coordinator call you with update.  Any questions? ?Keep being amazing!!  Thank you for allowing me to participate in your care.  I appreciate you. ?Kindest regards, ?Karlee Staff ?

## 2021-11-11 ENCOUNTER — Telehealth: Payer: Self-pay

## 2021-11-11 NOTE — Telephone Encounter (Signed)
Noted  

## 2021-11-11 NOTE — Telephone Encounter (Signed)
-----   Message from Venita Lick, NP sent at 11/09/2021  9:28 PM EDT ----- ?I have written script for wound vac supplies.  Need ASAP if we can fax to supply company who has in [place] and can deliver ASAP. ?

## 2021-11-11 NOTE — Telephone Encounter (Signed)
Patient prescription and insurance and demographics was sent over to [facility] at [phone]. Also, left message for medical supply company to see soon patient would be able to receive supplies. Please advise? ?

## 2021-11-11 NOTE — Telephone Encounter (Signed)
[name] Robinson with [facility] returned office call and states they do not carry the supplies for wound vac.  ?

## 2021-11-11 NOTE — Telephone Encounter (Signed)
Copied from [place] [phone]. Topic: General - Other ?>> Nov 11, 2021 12:21 PM Valere Dross wrote: ?Reason for CRM: Pts husband called in stating he wanted to thank pts PCP, for all the help she has done, he states they are very appreciative of everything she does. ?

## 2021-11-11 NOTE — Telephone Encounter (Signed)
Copied from [place] [phone]. Topic: General - Other ?>> Nov 11, 2021 12:21 PM Valere Dross wrote: ?Reason for CRM: Pts husband called in stating he wanted to thank pts PCP, for all the help she has done, he states they are very appreciative of everything she does. ?

## 2021-11-12 NOTE — Patient Instructions (Signed)
Wound Care, Adult ?Taking care of your wound properly can help to prevent pain, infection, and scarring. It can also help your wound heal more quickly. Follow instructions from your health care provider about how to care for your wound. ?Supplies needed: ?Soap and water. ?Wound cleanser, saline, or germ-free (sterile) water. ?Gauze. ?If needed, a clean bandage (dressing) or other type of wound dressing material to cover or place in the wound. Follow your health care provider's instructions about what dressing supplies to use. ?Cream or topical ointment to apply to the wound, if told by your health care provider. ?How to care for your wound ?Cleaning the wound ?Ask your health care provider how to clean the wound. This may include: ?Using mild soap and water, a wound cleanser, saline, or sterile water. ?Using a clean gauze to pat the wound dry after cleaning it. Do not rub or scrub the wound. ?Dressing care ?Wash your hands with soap and water for at least 20 seconds before and after you change the dressing. If soap and water are not available, use hand sanitizer. ?Change your dressing as told by your health care provider. This may include: ?Cleaning or rinsing out (irrigating) the wound. ?Application of cream or topical ointment, if told by your health care provider. ?Placing a dressing over the wound or in the wound (packing). ?Covering the wound with an outer dressing. ?Leave stitches (sutures), staples, skin glue, or adhesive strips in place. These skin closures may need to stay in place for 2 weeks or longer. If adhesive strip edges start to loosen and curl up, you may trim the loose edges. Do not remove adhesive strips completely unless your health care provider tells you to do that. ?Ask your health care provider when you can leave the wound uncovered. ?Checking for infection ?Check your wound area every day for signs of infection. Check for: ?More redness, swelling, or pain. ?Fluid or blood. ?Warmth. ?Pus or  a bad smell. ? ?Follow these instructions at home ?Medicines ?If you were prescribed an antibiotic medicine, cream, or ointment, take or apply it as told by your health care provider. Do not stop using the antibiotic even if your condition improves. ?If you were prescribed pain medicine, take it 30 minutes before you do any wound care or as told by your health care provider. ?Take over-the-counter and prescription medicines only as told by your health care provider. ?Eating and drinking ?Eat a diet that includes protein, vitamin A, vitamin C, and other nutrient-rich foods to help the wound heal. ?Foods rich in protein include meat, fish, eggs, dairy, beans, and nuts. ?Foods rich in vitamin A include carrots and dark green, leafy vegetables. ?Foods rich in vitamin C include citrus fruits, tomatoes, broccoli, and peppers. ?Drink enough fluid to keep your urine pale yellow. ?General instructions ?Do not take baths, swim, or use a hot tub until your health care provider approves. Ask your health care provider if you may take showers. You may only be allowed to take sponge baths. ?Do not scratch or pick at the wound. Keep it covered as told by your health care provider. ?Return to your normal activities as told by your health care provider. Ask your health care provider what activities are safe for you. ?Protect your wound from the sun when you are outside for the first 6 months, or for as long as told by your health care provider. Cover up the scar area or apply sunscreen that has an SPF of at least 30. ?Do not   use any products that contain nicotine or tobacco. These products include cigarettes, chewing tobacco, and vaping devices, such as e-cigarettes. If you need help quitting, ask your health care provider. ?Keep all follow-up visits. This is important. ?Contact a health care provider if: ?You received a tetanus shot and you have swelling, severe pain, redness, or bleeding at the injection site. ?Your pain is not  controlled with medicine. ?You have any of these signs of infection: ?More redness, swelling, or pain around the wound. ?Fluid or blood coming from the wound. ?Warmth coming from the wound. ?A fever or chills. ?You are nauseous or you vomit. ?You are dizzy. ?You have a new rash or hardness around the wound. ?Get help right away if: ?You have a red streak of skin near the area around your wound. ?Pus or a bad smell coming from the wound. ?Your wound has been closed with staples, sutures, skin glue, or adhesive strips and it begins to open up and separate. ?Your wound is bleeding, and the bleeding does not stop with gentle pressure. ?These symptoms may represent a serious problem that is an emergency. Do not wait to see if the symptoms will go away. Get medical help right away. Call your local emergency services (911 in the [place] Do not drive yourself to the hospital. ?Summary ?Always wash your hands with soap and water for at least 20 seconds before and after changing your dressing. ?Change your dressing as told by your health care provider. ?To help with healing, eat foods that are rich in protein, vitamin A, vitamin C, and other nutrients. ?Check your wound every day for signs of infection. Contact your health care provider if you think that your wound is infected. ?This information is not intended to replace advice given to you by your health care provider. Make sure you discuss any questions you have with your health care provider. ?Document Revised: 12/07/2020 Document Reviewed: 12/07/2020 ?Elsevier Patient Education ? 2022 [employer] ? ?

## 2021-11-14 ENCOUNTER — Ambulatory Visit: Payer: Self-pay | Admitting: *Deleted

## 2021-11-14 ENCOUNTER — Other Ambulatory Visit: Payer: Self-pay | Admitting: Nurse Practitioner

## 2021-11-14 MED ORDER — BUSPIRONE HCL 5 MG PO TABS: 10.0000 mg | ORAL_TABLET | Freq: Two times a day (BID) | ORAL | 4 refills | Status: DC

## 2021-11-14 MED ORDER — MIRTAZAPINE 7.5 MG PO TABS: 15.0000 mg | ORAL_TABLET | Freq: Every day | ORAL | 4 refills | Status: DC

## 2021-11-14 NOTE — Addendum Note (Signed)
Addended by: Marnee Guarneri T on: 11/14/2021 10:02 AM ? ? Modules accepted: Orders ? ?

## 2021-11-14 NOTE — Telephone Encounter (Signed)
?  Chief Complaint: anxiety, severe ?Symptoms: pt anxious and scared ?Frequency: constant ?Pertinent Negatives: Patient denies needing to go to hospital. Wants med changes ?Disposition: '[]'$ ED /'[]'$ Urgent Care (no appt availability in office) / '[x]'$ Appointment(In office/virtual)/ '[]'$  [place] [facility]/ '[]'$ Home Care/ '[]'$ Refused Recommended Disposition /'[]'$ [place] [facility]/ '[]'$  Follow-up with PCP ?Additional Notes: Pt and husband just want to talk to [facility] as soon as possible. Very upset. ? ?Reason for Disposition ? Patient sounds very upset or troubled to the triager ? ?Answer Assessment - Initial Assessment Questions ?1. CONCERN: "Did anything happen that prompted you to call today?"  ?    Medication changes ?2. ANXIETY SYMPTOMS: "Can you describe how you (your loved one; patient) have been feeling?" (e.g., tense, restless, panicky, anxious, keyed up, overwhelmed, sense of impending doom).  ?    panicky ?Could not ask further questions as pt and wife stating we just need to speak to [facility]! ? ?Protocols used: Anxiety and Panic Attack-A-AH ? ?

## 2021-11-14 NOTE — Telephone Encounter (Signed)
?  Chief Complaint: agent said suicidal thoughts, pt's husband did not say that ?Symptoms: anxiety attack ?Frequency: constant ?Pertinent Negatives: Patient denies na ?Disposition: '[]'$ ED /'[]'$ Urgent Care (no appt availability in office) / '[]'$ Appointment(In office/virtual)/ '[]'$  [place] [facility]/ '[]'$ Home Care/ '[]'$ Refused Recommended Disposition /'[]'$ [place] [facility]/ '[]'$  Follow-up with PCP ?Additional Notes: Again, would not finish answering triage questions due to Marnee Guarneri, NP called on other line. Pt's husband spoke with her and then got back with me and said Joleen changed her medication back to what she was on before she changed it last week. Has appt in two days.  ? ? ?Reason for Disposition ? Patient sounds very upset or troubled to the triager ? [1] Depression symptoms (sadness, hopelessness, decreased energy) AND [2] unable to do any normal activities (e.g., self care, school, work; in comparison to baseline). ? ?Answer Assessment - Initial Assessment Questions ?1. CONCERN: "Did anything happen that prompted you to call today?"  ?    Suicidal thoughts ?2. ANXIETY SYMPTOMS: "Can you describe how you (your loved one; patient) have been feeling?" (e.g., tense, restless, panicky, anxious, keyed up, overwhelmed, sense of impending doom).  ?    panicky ?3. ONSET: "How long have you been feeling this way?" (e.g., hours, days, weeks) ?    Getting worse since out of hospital after long stay. ?4. SEVERITY: "How would you rate the level of anxiety?" (e.g., 0 - 10; or mild, moderate, severe). ?    10 ?5. FUNCTIONAL IMPAIRMENT: "How have these feelings affected your ability to do daily activities?" "Have you had more difficulty than usual doing your normal daily activities?" (e.g., getting better, same, worse; self-care, school, work, interactions) ?    Would not stay on the phone to answer anymore questions. ? ?Protocols used: Anxiety and Panic Attack-A-AH, Suicide Concerns-A-AH ? ?

## 2021-11-15 ENCOUNTER — Telehealth: Payer: Self-pay

## 2021-11-15 ENCOUNTER — Other Ambulatory Visit: Payer: Medicare Other | Admitting: Primary Care

## 2021-11-15 DIAGNOSIS — A419 Sepsis, unspecified organism: Secondary | ICD-10-CM

## 2021-11-15 DIAGNOSIS — R29898 Other symptoms and signs involving the musculoskeletal system: Secondary | ICD-10-CM

## 2021-11-15 DIAGNOSIS — Z515 Encounter for palliative care: Secondary | ICD-10-CM

## 2021-11-15 DIAGNOSIS — Z934 Other artificial openings of gastrointestinal tract status: Secondary | ICD-10-CM

## 2021-11-15 DIAGNOSIS — F1190 Opioid use, unspecified, uncomplicated: Secondary | ICD-10-CM

## 2021-11-15 NOTE — Telephone Encounter (Signed)
-----   Message from Venita Lick, NP sent at 11/09/2021  9:28 PM EDT ----- ?I have written script for wound vac supplies.  Need ASAP if we can fax to supply company who has in [place] and can deliver ASAP. ?

## 2021-11-15 NOTE — Telephone Encounter (Signed)
Requested Prescriptions  ?Pending Prescriptions Disp Refills  ?? mirtazapine (REMERON) 7.5 MG tablet [Pharmacy Med Name: MIRTAZAPINE 7.5 MG TABLET] 60 tablet   ?  Sig: TAKE 2 TABLETS (15 MG TOTAL) BY MOUTH AT BEDTIME.  ?  ? Psychiatry: Antidepressants - mirtazapine Passed - 11/14/2021 10:31 AM  ?  ?  Passed - Valid encounter within last 6 months  ?  Recent Outpatient Visits   ?      ? 6 days ago Septic shock ([place])  ? [place], [place] T, NP  ? 5 months ago Painful urination  ? [facility] [place], [place], MD  ? 5 months ago Swelling of lower extremity  ? [place], [place] T, NP  ? 6 months ago Bradycardia  ? [place], NP  ? 8 months ago Hypothyroidism, unspecified type  ? [facility] [place], Henrine Screws T, NP  ?  ?  ?Future Appointments   ?        ? Tomorrow Venita Lick, NP [facility]  ?  ? ?  ?  ?  ? ? ?

## 2021-11-15 NOTE — Telephone Encounter (Signed)
Spoke with patient husband [name] to follow up to see if patient was able to received wound vac supplies. [name] says the home health nurse was able to get the patient wound vac supplies and she ordered them more supplies for the patient. Patient husband was advised to give our office a call back if he has any questions or concerns regarding the patients. ?

## 2021-11-15 NOTE — Progress Notes (Signed)
 Therapist, nutritional Palliative Care Consult Note Telephone: [phone]  Fax: [phone]   Date of encounter: 11/15/21 9:44 AM PATIENT NAME: [name] Robinson [address] [place] [address]   [phone] (home)  DOB: 1951-04-13 MRN: [id number] PRIMARY CARE PROVIDER:    Valerio Melanie DASEN, NP,  [address] [place] [address] [phone]  REFERRING PROVIDER:   Valerio Melanie DASEN, NP [address] [place],  [place] [address] [phone]  RESPONSIBLE PARTY:    Contact Information     Name Relation Home Work Mobile   [name] Robinson Spouse   [phone]   [name] Robinson Daughter   [phone]        I met face to face with patient and family in  home. Palliative Care was asked to follow this patient by consultation request of  [place], Jolene T, NP to address advance care planning and complex medical decision making. This is the initial visit.                                     ASSESSMENT AND PLAN / RECOMMENDATIONS:   Advance Care Planning/Goals of Care: Goals include to maximize quality of life and symptom management. Patient/health care surrogate gave his/her permission to discuss.Our advance care planning conversation included a discussion about:    The value and importance of advance care planning  Exploration of personal, cultural or spiritual beliefs that might influence medical decisions  Exploration of goals of care in the event of a sudden injury or illness  Identification of a healthcare agent - husband Review of an  advance directive document . Reviewed MOST form, they will discuss and complete next visit. CODE STATUS: FULL  Symptom Management/Plan:   Sacral Wound: has wound on sacrum from 4 mos hospital stay. Has wound vac being managed by home health RN. Home health [facility] [phone]. Pt reports healing well.  Nutrition: Reviewed choices post gastric surgery. She is eating carb heavy diet. Reviewed whole grains, high protein. I  have recommended pro stat OTC. She also has lost weight, now 140 lbs. Has J tube not used. Would like removed.Taking PO well, but I did ask her to weigh weekly to monitor. Last albumin note 2.8. I recommend at least 100 g protein daily for wound healing.  Pain: Denies. Has 100 mcg fentanyl  and would like to wean off. Increasing in mobility and motivated to wean. Reports disturbing dreams. I am happy to manage wean if other prescribers agree. Plan would be to decrease by 12 mcg every 6 days and use po prn if needed.   Anxiety: Endorses. PCP has ordered clonazepam. Also has buspar , cymbalta now 60 mg bid. Denies having psychiatry. Pt endorses plan to gradually add some normalizing activities every day as her stamina allows.   Medication Review: Some questions about meds she has in her home but not on her med list. I have listed below what she's taking and what she's not. Pcp to review as needed please.   Follow up Palliative Care Visit: Palliative care will continue to follow for complex medical decision making, advance care planning, and clarification of goals. Return  2-3 weeks or prn.  I spent 75 minutes providing this consultation. More than 50% of the time in this consultation was spent in counseling and care coordination.  PPS: 40%  HOSPICE ELIGIBILITY/DIAGNOSIS: TBD  Chief Complaint: debility  HISTORY OF PRESENT ILLNESS:  [name] Robinson is  a 71 y.o. 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 Patient seen today to review palliative care needs to include medical decision making and advance care planning as appropriate.   History obtained from review of EMR, discussion with primary team, and interview with family, facility staff/caregiver and/or Ms. Jons.  I reviewed available labs, medications, imaging, studies and related documents from the EMR.  Records reviewed and summarized above.    ROS  General: NAD EYES: denies vision changes, has glasses ENMT: denies dysphagia Cardiovascular: denies chest pain, denies DOE Pulmonary: denies cough, denies increased SOB Abdomen: endorses fair  appetite, denies constipation, endorses continence of bowel GU: denies dysuria, endorses continence of urine MSK:  endorses  increased weakness,  no falls reported Skin: denies rashes, endorses sacral wound Neurological: denies pain, denies insomnia Psych: Endorses depressed  mood Heme/lymph/immuno: denies bruises, abnormal bleeding  Physical Exam: Current and past weights: 155 lbs 4 mos ago, 140 lbs now, 9% from surgery Constitutional: 116/79 HR 110 RR 18 General: frail appearing, thin EYES: anicteric sclera, lids intact, no discharge  ENMT: intact hearing, oral mucous membranes moist, dentition intact CV: S1S2, RRR, tachycardia, no LE edema Pulmonary: LCTA, no increased work of breathing, no cough, room air Abdomen: intake 50%,  no ascites GU: deferred MSK: mod  sarcopenia, moves all extremities, ambulatory with walker  Skin: warm and dry, no rashes or wounds on visible skin, wound vac intact, discoloration of R toes (mildly cyanotic while dependent) + pulses bil LE Neuro:  + generalized weakness,  no cognitive impairment Psych: anxious affect, A and O x 3 Hem/lymph/immuno: no widespread bruising CURRENT PROBLEM LIST:  Patient Active Problem List   Diagnosis Date Noted   Skin ulcer of sacrum with fat layer exposed (HCC) 11/09/2021   Septic shock (HCC) 11/09/2021   S/P PICC central line placement 11/09/2021   Encounter for management of wound VAC 11/09/2021   B12 deficiency 11/09/2021   Jejunostomy tube present (HCC) 11/09/2021   Moderate protein-calorie malnutrition (HCC) 11/09/2021   Severe muscle deconditioning 11/09/2021   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   Bradycardia, sinus 06/09/2021   Iron deficiency anemia 05/20/2021   Osteopenia  of femoral neck 02/28/2021   Vitamin D  deficiency 02/26/2021   Lipoma of left lower extremity 01/27/2021   Hashimoto's thyroiditis 01/22/2021   Anxiety 01/22/2021   OSA on CPAP 01/22/2021   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/16/2020   OAB (overactive bladder) 02/07/2013   PAST MEDICAL HISTORY:  Active Ambulatory Problems    Diagnosis Date Noted   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/16/2020   OAB (overactive bladder) 02/07/2013   Hashimoto's thyroiditis 01/22/2021   Anxiety 01/22/2021   OSA on CPAP 01/22/2021   Lipoma of left lower extremity 01/27/2021   Vitamin D  deficiency 02/26/2021   Osteopenia of femoral neck 02/28/2021   Iron deficiency anemia 05/20/2021   Bradycardia, sinus 06/09/2021   Skin ulcer of sacrum with fat layer exposed (HCC) 11/09/2021   Septic shock (HCC) 11/09/2021   S/P PICC central line placement 11/09/2021   Encounter for management of wound VAC 11/09/2021   B12 deficiency 11/09/2021   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   Jejunostomy tube present (HCC) 11/09/2021   Moderate protein-calorie malnutrition (HCC) 11/09/2021   Severe muscle deconditioning 11/09/2021   Resolved Ambulatory Problems    Diagnosis Date Noted   Malabsorption 03/10/2021   Swelling of lower extremity 04/20/2021  Painful urination 06/01/2021   Urinary tract infection with hematuria 06/01/2021   Lower extremity edema 06/09/2021   Past Medical History:  Diagnosis Date   Hypothyroidism    Morbid obesity (HCC) 11/2019   Sleep apnea    SOCIAL HX:  Social History   Tobacco Use   Smoking status: Never   Smokeless tobacco: Never  Substance Use Topics   Alcohol use: Never   FAMILY HX:  Family History  Problem Relation Age of Onset   Thyroid  disease Mother    Cancer - Lung Father       ALLERGIES:  Allergies  Allergen Reactions   Solifenacin Rash    Patient had bad rash with this med.     PERTINENT MEDICATIONS:  Outpatient Encounter  Medications as of 11/15/2021  Medication Sig   busPIRone  (BUSPAR ) 5 MG tablet Take 2 tablets (10 mg total) by mouth 2 (two) times daily.   diltiazem (CARDIZEM CD) 120 MG 24 hr capsule Take 120 mg by mouth daily.   DULoxetine (CYMBALTA) 60 MG capsule Take 60 mg by mouth 2 (two) times daily.   ergocalciferol  (VITAMIN D2) 1.25 MG (50000 UT) capsule Take 1 capsule (50,000 Units total) by mouth once a week.   fentaNYL  (DURAGESIC ) 100 MCG/HR Place 1 patch onto the skin every 3 (three) days.   furosemide  (LASIX ) 20 MG tablet Take 1 tablet (20 mg total) by mouth daily.   gabapentin  (NEURONTIN ) 300 MG capsule Take 300 mg by mouth 3 (three) times daily.   levothyroxine  (SYNTHROID ) 137 MCG tablet Take 1 tablet (137 mcg total) by mouth daily before breakfast.   pantoprazole (PROTONIX) 40 MG tablet Take 40 mg by mouth daily.   tamsulosin (FLOMAX) 0.4 MG CAPS capsule Take 0.4 mg by mouth.   hydrOXYzine (ATARAX) 25 MG tablet Take 25 mg by mouth 3 (three) times daily. (Patient not taking: Reported on 11/15/2021)   mirtazapine  (REMERON ) 7.5 MG tablet Take 2 tablets (15 mg total) by mouth at bedtime. (Patient not taking: Reported on 11/15/2021)   ondansetron  (ZOFRAN  ODT) 4 MG disintegrating tablet Take 1 tablet (4 mg total) by mouth every 8 (eight) hours as needed for nausea or vomiting. (Patient not taking: Reported on 11/15/2021)   sucralfate (CARAFATE) 1 g tablet Take 1 tablet by mouth 4 (four) times daily. (Patient not taking: Reported on 11/15/2021)   vitamin B-12 (CYANOCOBALAMIN ) 1000 MCG tablet Take 1,000 mcg by mouth daily. (Patient not taking: Reported on 11/15/2021)   [DISCONTINUED] gabapentin  (NEURONTIN ) 250 MG/5ML solution Take 6 mLs by mouth 3 (three) times daily. 6 mls = 300 MG   No facility-administered encounter medications on file as of 11/15/2021.     Thank you for the opportunity to participate in the care of Ms. Gutknecht.  The palliative care team will continue to follow. Please call our office at  [phone] if we can be of additional assistance.   Lamarr Welby Sharps, NP , DNP, AGPCNP-BC  COVID-19 PATIENT SCREENING TOOL Asked and negative response unless otherwise noted:  Have you had symptoms of covid, tested positive or been in contact with someone with symptoms/positive test in the past 5-10 days?

## 2021-11-15 NOTE — Telephone Encounter (Signed)
Noted  

## 2021-11-16 ENCOUNTER — Encounter: Payer: Self-pay | Admitting: Nurse Practitioner

## 2021-11-16 ENCOUNTER — Telehealth: Payer: Self-pay

## 2021-11-16 ENCOUNTER — Ambulatory Visit (INDEPENDENT_AMBULATORY_CARE_PROVIDER_SITE_OTHER): Payer: Medicare Other | Admitting: Nurse Practitioner

## 2021-11-16 VITALS — BP 103/69 | HR 98 | Temp 97.9°F | Ht 67.0 in | Wt 141.4 lb

## 2021-11-16 DIAGNOSIS — A419 Sepsis, unspecified organism: Secondary | ICD-10-CM | POA: Diagnosis not present

## 2021-11-16 DIAGNOSIS — R6521 Severe sepsis with septic shock: Secondary | ICD-10-CM

## 2021-11-16 DIAGNOSIS — Z934 Other artificial openings of gastrointestinal tract status: Secondary | ICD-10-CM

## 2021-11-16 DIAGNOSIS — E440 Moderate protein-calorie malnutrition: Secondary | ICD-10-CM

## 2021-11-16 DIAGNOSIS — L98422 Non-pressure chronic ulcer of back with fat layer exposed: Secondary | ICD-10-CM

## 2021-11-16 DIAGNOSIS — D508 Other iron deficiency anemias: Secondary | ICD-10-CM

## 2021-11-16 DIAGNOSIS — R29898 Other symptoms and signs involving the musculoskeletal system: Secondary | ICD-10-CM

## 2021-11-16 DIAGNOSIS — F419 Anxiety disorder, unspecified: Secondary | ICD-10-CM

## 2021-11-16 DIAGNOSIS — Z4689 Encounter for fitting and adjustment of other specified devices: Secondary | ICD-10-CM

## 2021-11-16 MED ORDER — CITALOPRAM HYDROBROMIDE 20 MG PO TABS
20.0000 mg | ORAL_TABLET | Freq: Every day | ORAL | 4 refills | Status: DC
Start: 2021-11-16 — End: 2021-11-16

## 2021-11-16 MED ORDER — BUSPIRONE HCL 15 MG PO TABS: 15.0000 mg | ORAL_TABLET | Freq: Three times a day (TID) | ORAL | 4 refills | Status: DC

## 2021-11-16 NOTE — Assessment & Plan Note (Signed)
Present since bariatric surgery, exacerbated with recent hospitalization.  Restart supplements at home.  Scheduled to see hematology tomorrow, may need iron infusions for period, has had in past. ?

## 2021-11-16 NOTE — Progress Notes (Signed)
? ?BP 103/69   Pulse 98   Temp 97.9 ?F (36.6 ?C) (Oral)   Ht 5' 7" (1.702 m)   Wt 141 lb 6.4 oz (64.1 kg)   SpO2 99%   BMI 22.15 kg/m?   ? ?Subjective:  ? ? Patient ID: [name] Robinson, female    DOB: April 28, 1951, 71 y.o.   MRN: [id number] ? ?HPI: ?[name] Robinson is a 71 y.o. female ? ?Chief Complaint  ?Patient presents with  ? Wound Check  ?  Patient is here for 1 week follow up for wound check. Patient states it has been going good, but right now she has been shaky. Patient states the nurses have been a great help. Patient states she is really anxious currently crying.   ? ?WOUND CHECK: ?Has wound vac in place.  Home health is going to house and palliative had initial visit on 11/15/21 with goal to reduce Fentanyl.  Followed by Dr. Melba Coon at bariatric surgery at [facility].   Home health OT started yesterday and PT upcoming. ? ?Is eating meals at home -- 3 meals a day.  Had been on TPN in hospital for period - hospitalized for 4 months.  PICC line now out by home health nurse and J Tube remains in place per Dr. Melba Coon.  Goes tomorrow to hematology for anemia, chronic. ?Duration: months ?Location: sacral ?Pain: yes ?Quality: yes ?Severity: mild ?Redness: no ?Swelling: no ?Oozing: no ?Pus: no ?Fevers: no ?Nausea/vomiting: no ?Status: stable ?Treatments attempted: wound vac at present and home health   ? ?ANXIETY/STRESS ?Taking Celexa 20 MG and Buspar 10 MG BID + Duloxetine & Mirtazapine.  Having major issues with PTSD after 4 months in hospital due to some of the treatment in hospital with nursing not changing pads when wet or placing towel between her legs instead of cleaning her. ? ?Issues with Larkin Ina the care manager who discharged her without having all needs (including wound care needs) -- she has discussed this with Dr. Melba Coon who is working on reporting. ?Duration:uncontrolled ?Anxious mood: yes  ?Excessive worrying: yes ?Irritability: yes  ?Sweating: no ?Nausea: no ?Palpitations:no ?Hyperventilation: no ?Panic  attacks: yes ?Agoraphobia: no  ?Obscessions/compulsions: no ?Depressed mood: yes ? ?  11/16/2021  ? 11:01 AM 04/28/2021  ?  4:31 PM 02/28/2021  ? 10:25 AM 02/28/2021  ? 10:12 AM 01/27/2021  ? 11:06 AM  ?Depression screen PHQ 2/9  ?Decreased Interest 1 0 0 0 0  ?Down, Depressed, Hopeless 1 0 0 0 0  ?PHQ - 2 Score 2 0 0 0 0  ?Altered sleeping 1  0    ?Tired, decreased energy 3  0    ?Change in appetite 1  0    ?Feeling bad or failure about yourself  2  0    ?Trouble concentrating 1  0    ?Moving slowly or fidgety/restless 2  0    ?Suicidal thoughts 0  0    ?PHQ-9 Score 12  0    ?Difficult doing work/chores   Not difficult at all    ?Anhedonia: no ?Weight changes: no ?Insomnia: yes hard to fall asleep  ?Hypersomnia: no ?Fatigue/loss of energy: no ?Feelings of worthlessness: no ?Feelings of guilt: no ?Impaired concentration/indecisiveness: yes ?Suicidal ideations: no  ?Crying spells: yes ?Recent Stressors/Life Changes: yes ?  Relationship problems: no ?  Family stress: no   ?  Financial stress: no  ?  Job stress: no  ?  Recent death/loss:  hospital experience over 4 months   ? ?  11/16/2021  ? 11:03 AM 02/28/2021  ? 10:25 AM  ?GAD 7 : Generalized Anxiety Score  ?Nervous, Anxious, on Edge 2 0  ?Control/stop worrying 1 0  ?Worry too much - different things 2 0  ?Trouble relaxing 2 0  ?Restless 2 0  ?Easily annoyed or irritable 2 0  ?Afraid - awful might happen 2 0  ?Total GAD 7 Score 13 0  ?Anxiety Difficulty Somewhat difficult Not difficult at all  ? ?Relevant past medical, surgical, family and social history reviewed and updated as indicated. Interim medical history since our last visit reviewed. ?Allergies and medications reviewed and updated. ? ?Review of Systems  ?Constitutional:  Positive for fatigue. Negative for activity change, appetite change, diaphoresis and fever.  ?Respiratory:  Negative for cough, chest tightness, shortness of breath and wheezing.   ?Cardiovascular:  Negative for chest pain, palpitations and leg  swelling.  ?Gastrointestinal: Negative.   ?Neurological:  Positive for weakness. Negative for dizziness, syncope, light-headedness, numbness and headaches.  ?Psychiatric/Behavioral:  Positive for sleep disturbance. Negative for decreased concentration, self-injury and suicidal ideas. The patient is nervous/anxious.   ? ?Per HPI unless specifically indicated above ? ?   ?Objective:  ?  ?BP 103/69   Pulse 98   Temp 97.9 ?F (36.6 ?C) (Oral)   Ht 5' 7" (1.702 m)   Wt 141 lb 6.4 oz (64.1 kg)   SpO2 99%   BMI 22.15 kg/m?   ?Wt Readings from Last 3 Encounters:  ?11/16/21 141 lb 6.4 oz (64.1 kg)  ?11/09/21 154 lb (69.9 kg)  ?06/10/21 150 lb (68 kg)  ?  ?Physical Exam ?Vitals and nursing note reviewed.  ?Constitutional:   ?   General: She is awake. She is not in acute distress. ?   Appearance: She is well-developed, well-groomed and underweight. She is ill-appearing. She is not toxic-appearing.  ?HENT:  ?   Head: Normocephalic.  ?   Right Ear: Hearing normal.  ?   Left Ear: Hearing normal.  ?Eyes:  ?   General: Lids are normal.     ?   Right eye: No discharge.     ?   Left eye: No discharge.  ?   Conjunctiva/sclera: Conjunctivae normal.  ?   Pupils: Pupils are equal, round, and reactive to light.  ?Neck:  ?   Thyroid: No thyromegaly.  ?   Vascular: No carotid bruit.  ?Cardiovascular:  ?   Rate and Rhythm: Normal rate and regular rhythm.  ?   Heart sounds: Normal heart sounds. No murmur heard. ?  No gallop.  ?Pulmonary:  ?   Effort: Pulmonary effort is normal. No accessory muscle usage or respiratory distress.  ?   Breath sounds: Normal breath sounds.  ?Abdominal:  ?   General: Bowel sounds are normal. There is no distension.  ?   Palpations: Abdomen is soft.  ?   Tenderness: There is no abdominal tenderness.  ? ? ?Musculoskeletal:  ?   Cervical back: Normal range of motion and neck supple.  ?   Right lower leg: Edema (trace) present.  ?   Left lower leg: Edema (trace) present.  ?Lymphadenopathy:  ?   Head:  ?   Right  side of head: No submental, submandibular, tonsillar, preauricular or posterior auricular adenopathy.  ?   Left side of head: No submental, submandibular, tonsillar, preauricular or posterior auricular adenopathy.  ?   Cervical: No cervical adenopathy.  ?Skin: ?   General: Skin is warm and dry.  ?  Findings: Bruising (scattered to upper extremities) and wound present.  ?   Comments: Wound vac present and intact.  ?Neurological:  ?   Mental Status: She is alert and oriented to person, place, and time.  ?   Cranial Nerves: Cranial nerves 2-12 are intact.  ?   Motor: Weakness present.  ?   Deep Tendon Reflexes: Reflexes are normal and symmetric.  ?   Reflex Scores: ?     Brachioradialis reflexes are 2+ on the right side and 2+ on the left side. ?     Patellar reflexes are 2+ on the right side and 2+ on the left side. ?Psychiatric:     ?   Attention and Perception: Attention normal.     ?   Mood and Affect: Mood normal.     ?   Speech: Speech normal.     ?   Behavior: Behavior normal. Behavior is cooperative.     ?   Thought Content: Thought content normal.  ? ?Results for orders placed or performed in visit on 11/09/21  ?CBC with Differential/Platelet  ?Result Value Ref Range  ? WBC 4.6 3.4 - 10.8 x10E3/uL  ? RBC 3.36 (L) 3.77 - 5.28 x10E6/uL  ? Hemoglobin 10.0 (L) 11.1 - 15.9 g/dL  ? Hematocrit 30.3 (L) 34.0 - 46.6 %  ? MCV 90 79 - 97 fL  ? MCH 29.8 26.6 - 33.0 pg  ? MCHC 33.0 31.5 - 35.7 g/dL  ? RDW 12.8 11.7 - 15.4 %  ? Platelets 168 150 - 450 x10E3/uL  ? Neutrophils 74 Not Estab. %  ? Lymphs 18 Not Estab. %  ? Monocytes 7 Not Estab. %  ? Eos 1 Not Estab. %  ? Basos 0 Not Estab. %  ? Neutrophils Absolute 3.5 1.4 - 7.0 x10E3/uL  ? Lymphocytes Absolute 0.8 0.7 - 3.1 x10E3/uL  ? Monocytes Absolute 0.3 0.1 - 0.9 x10E3/uL  ? EOS (ABSOLUTE) 0.0 0.0 - 0.4 x10E3/uL  ? Basophils Absolute 0.0 0.0 - 0.2 x10E3/uL  ? Immature Granulocytes 0 Not Estab. %  ? Immature Grans (Abs) 0.0 0.0 - 0.1 x10E3/uL  ?Comprehensive metabolic  panel  ?Result Value Ref Range  ? Glucose 96 70 - 99 mg/dL  ? BUN 18 8 - 27 mg/dL  ? Creatinine, Ser 0.38 (L) 0.57 - 1.00 mg/dL  ? eGFR 109 >59 mL/min/1.73  ? BUN/Creatinine Ratio 47 (H) 12 - 28  ? Sodium 142 134 - 144 mm

## 2021-11-16 NOTE — Assessment & Plan Note (Signed)
Chronic, exacerbated by recent 4 months in hospital and serious illness = PTSD aspect present.  Referral to CCM team and urgent referral for therapy, would benefit virtual therapy at this time for PTSD elements present.  Continue Duloxetine as ordered in hospital, stop Celexa due to Duloxetine in place.  Increase Buspar to 15 MG TID for anxiety.  Has Klonopin at home from hospital, recommend she use this for severe anxiety only and not daily.  Denies SI/HI.  Continue palliative collaboration. ?

## 2021-11-16 NOTE — Assessment & Plan Note (Signed)
With wound vac in place, continue collaboration with home health and is scheduled to see wound care in April locally + continue to collaborate with surgeon at [facility]. ?

## 2021-11-16 NOTE — Assessment & Plan Note (Signed)
Related to recent 4 month hospitalization and deconditioning.  Recommend increasing Boost supplements to 3-4 times daily.  Ensure eating frequent, small, healthy meals.  Recheck labs in 4 weeks. ?

## 2021-11-16 NOTE — Assessment & Plan Note (Signed)
Placed in hospital 07/22/21, remains in place at this time.  Followed by bariatric surgery at [facility], Dr. Melba Coon, continue this collaboration -- Dr. Melba Coon has recommended to keep this in place. ?

## 2021-11-16 NOTE — Assessment & Plan Note (Signed)
Refer to protein calorie malnutrition plan of care.  Home health for PT/OT collaboration continues. ?

## 2021-11-16 NOTE — Chronic Care Management (AMB) (Signed)
?  Care Management  ? ?Outreach Note ? ?11/16/2021 ?Name: Tinzlee Craker MRN: [id number] DOB: Jan 04, 1951 ? ?Referred by: Venita Lick, NP ?Reason for referral : Care Coordination (Outreach to schedule referral with RN CM and LCSW ) ? ? ?An unsuccessful telephone outreach was attempted today. The patient was referred to the case management team for assistance with care management and care coordination.  ? ?Follow Up Plan:  ?The care management team will reach out to the patient again over the next 5 days.  ?If patient returns call to provider office, please advise to call [place] * at [phone]* ? ?Noreene Larsson, RMA ?Care Guide, Embedded Care Coordination ?[facility]  ?[place], [place] [address] ?Direct Dial: [phone] ?Museum/gallery [email or web address] ?Website: [email or web address]  ? ?

## 2021-11-16 NOTE — Assessment & Plan Note (Signed)
Acute and improved, recent labs stable with exception of anemia (baseline) she is seeing hematology tomorrow.  Hospitalized for 4 months at [facility], has deconditioned since this time. Continue home health and palliative collaborations + add on CCM team for support due to patient severe PTSD post hospitalization. ?

## 2021-11-16 NOTE — Assessment & Plan Note (Signed)
Continue collaboration with home health who is caring for wound vac at this time and collaboration with [facility] surgical team. ?

## 2021-11-17 ENCOUNTER — Ambulatory Visit: Payer: Self-pay

## 2021-11-17 NOTE — Telephone Encounter (Signed)
Spoke with Patient's husband and he verbalized understanding of your message. He did reach out to Dr. Roe Rutherford office and no one has returned his call. Husband wanted to know if you could reach out to Dr. Melba Coon for them thinking perhaps you may have more luck reaching Dr. Melba Coon.  ?

## 2021-11-17 NOTE — Telephone Encounter (Signed)
?  Chief Complaint: Feeding tube fell out ?Symptoms: some leakage of green liquid  ?Frequency: just a bit ago ?Pertinent Negatives: Patient denies  ?Disposition: '[x]'$ ED /'[]'$ Urgent Care (no appt availability in office) / '[]'$ Appointment(In office/virtual)/ '[]'$  [facility]/ '[]'$ Home Care/ '[]'$ Refused Recommended Disposition /'[]'$ [facility]/ '[]'$  Follow-up with PCP ?Additional Notes: PT's husband will have this evaluated by home health nurse. Pt is weak and does not want to go to ED.   ? ? ?Reason for Disposition ? G-tube (or PEG), J-tube, or GJ-tube came completely out of site in abdominal wall ? ?Answer Assessment - Initial Assessment Questions ?1. MAIN CONCERN OR SYMPTOM:  "What is your main concern right now?" "What question do you have?" "What's the main symptom you're worried about?" (e.g., fever, pain, redness, swelling) ?    Tube fell out ?2. ONSET: "When did the symptom or problem start (or worsen)?" (minutes, hours, days, weeks) ?    today ?3. WHAT TYPE: "What type of feeding tube is it?" (e.g., NG tube, NJ tube, G tube, GJ tube, J tube, PEG). ?     ?4. WHEN INSERTED: "When was the tube put in", "Has it been changed, if so when?" ?     ?5. WHO INSERTED: "Do you recall who put the tube in?" ?    Surgeon ?6. FEEDINGS: "Has the type, rate or concentration of the tube feedings changed?" ?     ?7. MEDICATIONS:  "Are you taking any medications via your feeding tube?"  "Are these medications liquid or crushed given in your feeding tube?"  ?    no ?8. VOMITING and DIARRHEA: "Is there new vomiting or diarrhea?" (e.g., number of time; description) ?    no ?9. OTHER SYMPTOMS: "What other symptoms are you having?" (e.g., shortness of breath, fever, abdominal pain) ?     ?10. HOME HEALTH NURSE: "Do you have a home health (visiting) nurse?" ?      yes ? ?Protocols used: Feeding Tube Symptoms and Questions-A-AH ? ?

## 2021-11-17 NOTE — Telephone Encounter (Signed)
Spoke with Dr. Melba Coon. He advised to let patient know that it was ok that the feeding tube fell out and not to worry. The leakage of green liquid is expected. He recommends that they cover with guaze and keep cover and not put any pressure on site and the site will close up in short order. He also advised that he will be speaking to them at their next appointment.  ? ?Patient was advised of Dr. Roe Rutherford recommendations and husband verbalized understand and was grateful that we took the time to reach out to Dr. Melba Coon ?

## 2021-11-18 ENCOUNTER — Telehealth: Payer: Self-pay

## 2021-11-18 NOTE — Telephone Encounter (Signed)
Provider Jeri Cos, PA from [facility] called and was asking about if pt had current HH. Per notes from [place] on 11/09/21 he understood that pt needed AL since PT/OT states she was unsafe at home. Alvis Lemmings was the [facility] who was referred and then he found in note from [place] on 11/15/21 [facility] was following pt and they would reach out to contact them for further questions.  ?

## 2021-11-21 ENCOUNTER — Telehealth: Payer: Self-pay | Admitting: Primary Care

## 2021-11-21 ENCOUNTER — Ambulatory Visit: Payer: Medicare Other | Admitting: Physician Assistant

## 2021-11-21 NOTE — Telephone Encounter (Signed)
T/c to family to discuss wean from narcotics. I spoke with surgical team last week, who approved my taking over prescribing to wean. If wean fails I will refer her to pain management. However, I called today to reiterate the wean plan. Home health RN Jeannett Senior from [facility] also called to ask about wean. I instructed husband emphatically NOT to cut the patches for any reason. When I return in a week I will again go over wean and prescribe the correct patches at that time. She should continue to take the 100 mcg patches as she has been, until next week. I explained the wean to [place] and also asked her to reinforce the plan. I will leave a written plan with patient next week upon my visit. Husband voices understanding, and acknowledges he remembers me and our meeting last week. ?

## 2021-11-21 NOTE — Telephone Encounter (Signed)
Late Entry 11/16/21 ?T/c with Gracy Bruins, NP from clinic. Discussed pt case, including my plan for weaning narcotic. Ms [name] Robinson said it would be fine for me to assume prescription and to refer to local pain management if she cannot wean or needs specialty care. Ms. [name] Robinson felt this was a reasonable goal as her surgical issues were resolving at this writing, and patient goals are to wean off narcotics. She endorsed they follow yearly for life, and more frequently during recuperation stage. She invited me to collaborate with the clinic for patient care.  ?

## 2021-11-23 ENCOUNTER — Telehealth: Payer: Self-pay | Admitting: Oncology

## 2021-11-23 ENCOUNTER — Telehealth: Payer: Self-pay | Admitting: *Deleted

## 2021-11-23 ENCOUNTER — Inpatient Hospital Stay: Payer: Medicare Other

## 2021-11-23 ENCOUNTER — Inpatient Hospital Stay: Payer: Medicare Other | Admitting: Oncology

## 2021-11-23 NOTE — Telephone Encounter (Signed)
Returned pt phone req to resch pt appt, husband called in stating that pt is not feeling well...KJ  appt has been resch ? ?

## 2021-11-23 NOTE — Telephone Encounter (Signed)
Patient husband called stating that patient is unable to make her appointment this afternoon and needs a return call to him at [phone] to reschedule it ?

## 2021-11-24 ENCOUNTER — Inpatient Hospital Stay: Payer: Medicare Other

## 2021-11-24 ENCOUNTER — Telehealth: Payer: Self-pay | Admitting: Nurse Practitioner

## 2021-11-24 ENCOUNTER — Inpatient Hospital Stay: Payer: Medicare Other | Admitting: Oncology

## 2021-11-24 NOTE — Telephone Encounter (Signed)
Copied from [place] [id number]. Topic: General - Other ?>> Nov 24, 2021 11:55 AM Yvette Rack wrote: ?Reason for CRM: Pt husband [name] stated he is worried about his wife because she continues to get weaker and he does not understand why a nurse is not coming out to the home. [name] requests that Henrine Screws return his call at [phone] ?

## 2021-11-25 ENCOUNTER — Encounter: Payer: Self-pay | Admitting: Oncology

## 2021-11-25 NOTE — Progress Notes (Signed)
Pt husband concerned for patient weight loss. ?

## 2021-11-25 NOTE — Telephone Encounter (Signed)
Spoke with patient's husband [name], informed him of recommendations from [facility]  to call Palliative nursing and to reduce Buspar from three times daily to twice daily [name] verbalized understanding.  ?

## 2021-11-25 NOTE — Telephone Encounter (Signed)
Called patient's husband to discuss [name] Robinson's recommendations with him. Unable to leave message due to voicemail box is full.  ? ?[place] for PEC/Nurse Triage to discuss if patient husband calls back.  ?

## 2021-11-25 NOTE — Telephone Encounter (Signed)
Spoke with Ms. [name] Robinson's husband [name] Robinson, he states that wound care nurse is still coming, as a matter of fact she is due there within the hour. He also stated that Ralene Bathe from [facility] is the nurse from home health and is due out there on the 18th and thinks that she needs to come more often. ? ?[name] Robinson was also concerned that the increase in Buspar to '15mg'$  three times a day is too much for wife, she has been getting dizzy while in bed.  ? ?Please advise. ?

## 2021-11-28 ENCOUNTER — Inpatient Hospital Stay: Payer: Medicare Other | Attending: Oncology | Admitting: Oncology

## 2021-11-28 ENCOUNTER — Inpatient Hospital Stay: Payer: Medicare Other

## 2021-11-28 VITALS — BP 99/64 | HR 85 | Temp 97.8°F | Resp 16 | Ht 67.0 in | Wt 134.1 lb

## 2021-11-28 DIAGNOSIS — R7989 Other specified abnormal findings of blood chemistry: Secondary | ICD-10-CM

## 2021-11-28 DIAGNOSIS — Z801 Family history of malignant neoplasm of trachea, bronchus and lung: Secondary | ICD-10-CM | POA: Diagnosis not present

## 2021-11-28 DIAGNOSIS — D649 Anemia, unspecified: Secondary | ICD-10-CM | POA: Insufficient documentation

## 2021-11-28 DIAGNOSIS — R5383 Other fatigue: Secondary | ICD-10-CM | POA: Diagnosis not present

## 2021-11-28 LAB — CBC WITH DIFFERENTIAL/PLATELET
Abs Immature Granulocytes: 0.01 10*3/uL (ref 0.00–0.07)
Basophils Absolute: 0 10*3/uL (ref 0.0–0.1)
Basophils Relative: 1 %
Eosinophils Absolute: 0 10*3/uL (ref 0.0–0.5)
Eosinophils Relative: 1 %
HCT: 36.3 % (ref 36.0–46.0)
Hemoglobin: 11.4 g/dL — ABNORMAL LOW (ref 12.0–15.0)
Immature Granulocytes: 0 %
Lymphocytes Relative: 23 %
Lymphs Abs: 1.2 10*3/uL (ref 0.7–4.0)
MCH: 29.8 pg (ref 26.0–34.0)
MCHC: 31.4 g/dL (ref 30.0–36.0)
MCV: 95 fL (ref 80.0–100.0)
Monocytes Absolute: 0.2 10*3/uL (ref 0.1–1.0)
Monocytes Relative: 4 %
Neutro Abs: 3.7 10*3/uL (ref 1.7–7.7)
Neutrophils Relative %: 71 %
Platelets: 179 10*3/uL (ref 150–400)
RBC: 3.82 MIL/uL — ABNORMAL LOW (ref 3.87–5.11)
RDW: 12.2 % (ref 11.5–15.5)
WBC: 5.2 10*3/uL (ref 4.0–10.5)
nRBC: 0 % (ref 0.0–0.2)

## 2021-11-28 LAB — TSH: TSH: 0.237 u[IU]/mL — ABNORMAL LOW (ref 0.350–4.500)

## 2021-11-28 LAB — LACTATE DEHYDROGENASE: LDH: 103 U/L (ref 98–192)

## 2021-11-28 LAB — RETIC PANEL
Immature Retic Fract: 5.3 % (ref 2.3–15.9)
RBC.: 3.82 MIL/uL — ABNORMAL LOW (ref 3.87–5.11)
Retic Count, Absolute: 26.4 10*3/uL (ref 19.0–186.0)
Retic Ct Pct: 0.7 % (ref 0.4–3.1)
Reticulocyte Hemoglobin: 33 pg (ref >27.9)

## 2021-11-28 LAB — VITAMIN B12: Vitamin B-12: 534 pg/mL (ref 180–914)

## 2021-11-28 LAB — FOLATE: Folate: 16.9 ng/mL (ref >5.9)

## 2021-11-28 NOTE — Progress Notes (Signed)
?Hematology/Oncology Consult note ?[phone] [phone] ?  ? ?   ? ? ?Patient Care Team: ?Venita Lick, NP as PCP - General (Nurse Practitioner) ?Jason Coop, NP as Nurse Practitioner [facility]) ?Talbot Grumbling, PA-C as Consulting Physician (Physician Assistant) ?Diona Browner, MD as Referring Physician (Surgery) ? ?REFERRING PROVIDER: ?Venita Lick, NP  ?CHIEF COMPLAINTS/REASON FOR VISIT:  ?Evaluation of anemia ? ?HISTORY OF PRESENTING ILLNESS:  ? ?[name] Robinson is a  71 y.o.  female with PMH listed below was seen in consultation at the request of  Marnee Guarneri T, NP  for evaluation of anemia ? ?11/09/2021, CBC showed Hemoglobin of 10, hematocrit 30.3, MCV 90, WBC 4.6, normal differential.  Normal platelet count. ?Ferritin 2223, TIBC 159, iron saturation 19, vitamin B12 888 ?Patient reports feeling fatigued ? ?Patient is recovering from a prolonged hospitalization at [facility].  Extensive medical records review was performed by me. ? 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 ?04/15/2021,  patient had laparoscopic with small bowel resection. ? ?06/10/2021, due to abdominal pain, nausea, vomiting, ?06/27/2021, patient was admitted for exploratory laparoscopy, she was found to have a severe adhesion, patient underwent small bowel resection x3 with gastric bypass.  Patient had very complicated post surgery complications, including GJ anastomosis leak, bile peritonitis, intra-abdominal abscess status post multiple procedures for drainage, sepsis, acute respiratory failure, pleural effusion, diarrhea, vitamin deficiencies, left subcutaneous abscess status post drainage, cardiomyopathy, A-fib, iron deficiency anemia due to melena, status post multiple units of blood transfusion, status post previous IV Venofer treatments. ?Patient was on tube feeding until mid late March 2023.  She tolerates regular  diet none. ?Patient has been sacral ulcer with wound VAC in place. ? ?+ fatigue.  She denies any blood in the stool/melena. ? ? ?Review of Systems  ?Constitutional:  Positive for fatigue. Negative for appetite change, chills and fever.  ?HENT:   Negative for hearing loss and voice change.   ?Eyes:  Negative for eye problems.  ?Respiratory:  Negative for chest tightness and cough.   ?Cardiovascular:  Negative for chest pain.  ?Gastrointestinal:  Negative for abdominal distention, abdominal pain and blood in stool.  ?Endocrine: Negative for hot flashes.  ?Genitourinary:  Negative for difficulty urinating and frequency.   ?Musculoskeletal:  Negative for arthralgias.  ?Skin:  Negative for itching and rash.  ?     +Wound VAC  ?Neurological:  Negative for extremity weakness.  ?Hematological:  Negative for adenopathy.  ?Psychiatric/Behavioral:  Negative for confusion. The patient is nervous/anxious.   ? ?MEDICAL HISTORY:  ?Past Medical History:  ?Diagnosis Date  ? Hypothyroidism   ? Morbid obesity ([place]) 11/2019  ? s/p Barriatric Sgx [facility])  ? Sleep apnea   ? ? ?SURGICAL HISTORY: ?Past Surgical History:  ?Procedure Laterality Date  ? ABDOMINAL HYSTERECTOMY    ? BARIATRIC SURGERY N/A 01/02/2020  ? duodenal switch  ? CERVICAL DISC SURGERY N/A   ? discectomy  ? COLONOSCOPY    ? ESOPHAGOGASTRODUODENOSCOPY  11/06/2020  ? EYE SURGERY Bilateral   ? laser procedure  ? LAPAROSCOPIC COLON RESECTION    ? MASS EXCISION Left 04/13/2021  ? Procedure: EXCISION MASS, left lateral thigh;  Surgeon: Ronny Bacon, MD;  Location: [facility] ORS;  Service: General;  Laterality: Left;  ? SHOULDER SURGERY Left   ? torn cartilege  ? TONSILLECTOMY N/A   ? TOTAL HIP ARTHROPLASTY Right 11/02/2020  ? TRANSTHORACIC ECHOCARDIOGRAM  07/14/2019  ? a) [facility]) Technically difficult.-Used Definity.  Nl LV Fxn -EF 55 - 60%.  No obvious WMA.  Mild MR.  No effusion;; b) Echo 04/16/2021-[facility]: (Fair quality) normal LV size and function.  EF 55-60%.  No  R WMA.  Normal diastolic parameters.  Mild MR.  Unable to assess RVSP.  ? ? ?SOCIAL HISTORY: ?Social History  ? ?Socioeconomic History  ? Marital status: Married  ?  Spouse name: [name]  ? Number of children: 2  ? Years of education: Not on file  ? Highest education level: Not on file  ?Occupational History  ? Not on file  ?Tobacco Use  ? Smoking status: Never  ? Smokeless tobacco: Never  ?Vaping Use  ? Vaping Use: Never used  ?Substance and Sexual Activity  ? Alcohol use: Never  ? Drug use: Never  ? Sexual activity: Not Currently  ?Other Topics Concern  ? Not on file  ?Social History Narrative  ? 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  This involves pretty much total care.  ? Otherwise, she is usually on the go, not sedentary.  ? ?Social Determinants of Health  ? ?Financial Resource Strain: Low Risk   ? Difficulty of Paying Living Expenses: Not hard at all  ?Food Insecurity: No Food Insecurity  ? Worried About Charity fundraiser in the Last Year: Never true  ? Ran Out of Food in the Last Year: Never true  ?Transportation Needs: No Transportation Needs  ? Lack of Transportation (Medical): No  ? Lack of Transportation (Non-Medical): No  ?Physical Activity: Sufficiently Active  ? Days of Exercise per Week: 7 days  ? Minutes of Exercise per Session: 30 min  ?Stress: No Stress Concern Present  ? Feeling of Stress : Not at all  ?Social Connections: Socially Integrated  ? Frequency of Communication with Friends and Family: Once a week  ? Frequency of Social Gatherings with Friends and Family: Twice a week  ? Attends Religious Services: More than 4 times per year  ? Active Member of Clubs or Organizations: Yes  ? Attends Archivist Meetings: More than 4 times per year  ? Marital Status: Married  ?Intimate Partner Violence: Not At Risk  ? Fear of Current or Ex-Partner: No  ? Emotionally Abused: No  ? Physically Abused: No  ? Sexually Abused:  No  ? ? ?FAMILY HISTORY: ?Family History  ?Problem Relation Age of Onset  ? Thyroid disease Mother   ? Cancer - Lung Father   ? ? ?ALLERGIES:  is allergic to solifenacin. ? ?MEDICATIONS:  ?Current Outpatient Medications  ?Medication Sig Dispense Refill  ? busPIRone (BUSPAR) 15 MG tablet Take 1 tablet (15 mg total) by mouth 3 (three) times daily. 270 tablet 4  ? citalopram (CELEXA) 20 MG tablet Take 20 mg by mouth daily.    ? clonazePAM (KLONOPIN) 0.5 MG tablet Take 0.5 mg by mouth 2 (two) times daily as needed.    ? diltiazem (CARDIZEM CD) 120 MG 24 hr capsule Take 120 mg by mouth daily.    ? DULoxetine (CYMBALTA) 60 MG capsule Take 60 mg by mouth 2 (two) times daily.    ? ergocalciferol (VITAMIN D2) 1.25 MG (50000 UT) capsule Take 1 capsule (50,000 Units total) by mouth once a week. 12 capsule 5  ? fentaNYL (DURAGESIC) 100 MCG/HR Place 1 patch onto the skin every 3 (three) days.    ? furosemide (LASIX) 20 MG tablet Take 1 tablet (20 mg  total) by mouth daily. 14 tablet 0  ? gabapentin (NEURONTIN) 300 MG capsule Take 300 mg by mouth 3 (three) times daily.    ? levothyroxine (SYNTHROID) 137 MCG tablet Take 1 tablet (137 mcg total) by mouth daily before breakfast. 90 tablet 4  ? mirtazapine (REMERON) 7.5 MG tablet TAKE 2 TABLETS (15 MG TOTAL) BY MOUTH AT BEDTIME. 60 tablet 5  ? pantoprazole (PROTONIX) 40 MG tablet Take 40 mg by mouth daily.    ? tamsulosin (FLOMAX) 0.4 MG CAPS capsule Take 0.4 mg by mouth.    ? vitamin B-12 (CYANOCOBALAMIN) 1000 MCG tablet Take 1,000 mcg by mouth daily.    ? ?No current facility-administered medications for this visit.  ? ? ? ?PHYSICAL EXAMINATION: ?ECOG PERFORMANCE STATUS: 2 - Symptomatic, <50% confined to bed ?Vitals:  ? 11/28/21 1525  ?BP: 99/64  ?Pulse: 85  ?Resp: 16  ?Temp: 97.8 ?F (36.6 ?C)  ?SpO2: 99%  ? ?Filed Weights  ? 11/28/21 1525  ?Weight: 134 lb 1.6 oz (60.8 kg)  ? ? ?Physical Exam ?Constitutional:   ?   General: She is not in acute distress. ?   Comments: Frail elderly  female, sitting in wheelchair.  ?HENT:  ?   Head: Normocephalic and atraumatic.  ?Eyes:  ?   General: No scleral icterus. ?Cardiovascular:  ?   Rate and Rhythm: Normal rate and regular rhythm.  ?   Hear

## 2021-11-29 ENCOUNTER — Other Ambulatory Visit: Payer: Medicare Other | Admitting: Primary Care

## 2021-11-29 ENCOUNTER — Other Ambulatory Visit: Payer: Self-pay

## 2021-11-29 DIAGNOSIS — D649 Anemia, unspecified: Secondary | ICD-10-CM

## 2021-11-29 DIAGNOSIS — Z515 Encounter for palliative care: Secondary | ICD-10-CM

## 2021-11-29 DIAGNOSIS — F1190 Opioid use, unspecified, uncomplicated: Secondary | ICD-10-CM

## 2021-11-29 DIAGNOSIS — G8929 Other chronic pain: Secondary | ICD-10-CM

## 2021-11-29 DIAGNOSIS — F419 Anxiety disorder, unspecified: Secondary | ICD-10-CM

## 2021-11-29 LAB — TECHNOLOGIST SMEAR REVIEW
Plt Morphology: NORMAL
RBC MORPHOLOGY: NORMAL
WBC MORPHOLOGY: NORMAL

## 2021-11-29 LAB — KAPPA/LAMBDA LIGHT CHAINS
Kappa free light chain: 42.9 mg/L — ABNORMAL HIGH (ref 3.3–19.4)
Kappa, lambda light chain ratio: 1.59 (ref 0.26–1.65)
Lambda free light chains: 26.9 mg/L — ABNORMAL HIGH (ref 5.7–26.3)

## 2021-11-29 LAB — T4, FREE: Free T4: 1.24 ng/dL — ABNORMAL HIGH (ref 0.61–1.12)

## 2021-11-29 NOTE — Chronic Care Management (AMB) (Signed)
?  Care Management  ? ?Outreach Note ? ?11/29/2021 ?Name: [name] Robinson MRN: [id number] DOB: 06/26/1951 ? ?Referred by: Venita Lick, NP ?Reason for referral : Care Coordination (Outreach to schedule referral with RN CM and LCSW ) ? ? ?A second unsuccessful telephone outreach was attempted today. The patient was referred to the case management team for assistance with care management and care coordination.  ? ?Follow Up Plan:  ?The care management team will reach out to the patient again over the next 7 days.  ?If patient returns call to provider office, please advise to call Embedded Care Management Care Guide Noreene Larsson * at [phone]* ? ?Noreene Larsson, RMA ?Care Guide, Embedded Care Coordination ?[facility]  [facility]  ?[place], [place] [address] ?Direct Dial: [phone] ?Museum/gallery conservator.[email or web address] ?Website: [email or web address]  ? ?

## 2021-11-29 NOTE — Progress Notes (Signed)
? ? ?Manufacturing engineer ?Community Palliative Care Consult Note ?Telephone: [phone]  ?Fax: [phone]  ? ? ?Date of encounter: 11/29/21 ?10:25 AM ?PATIENT NAME: [name] Robinson ?[address] ?[name] [address]   ?[phone] (home)  ?DOB: [birth year] ?MRN: [id number] ?PRIMARY CARE PROVIDER:    ?Venita Lick, NP,  ?[address] [place] [address] ?[phone] ? ?REFERRING PROVIDER:   ?Venita Lick, NP ?[address] [place],  [place] [address] ?[phone] ? ?RESPONSIBLE PARTY:    ?Contact Information   ? ? Name Relation Home Work Mobile  ? [name] Robinson Spouse   [phone]  ? [name] Robinson Daughter   [phone]  ? ?  ? ? ?I met face to face with patient and family in  home. Palliative Care was asked to follow this patient by consultation request of  Venita Lick, NP to address advance care planning and complex medical decision making. This is a follow up visit. ? ?                                 ASSESSMENT AND PLAN / RECOMMENDATIONS:  ? ?Advance Care Planning/Goals of Care: Goals include to maximize quality of life and symptom management. Patient/health care surrogate gave his/her permission to discuss.Our advance care planning conversation included a discussion about:    ?Experiences with loved ones who have been seriously ill or have died  ?Exploration of personal, cultural or spiritual beliefs that might influence medical decisions  ?Voices many family losses this year through cancer deaths and several suicides ?Voices she is often tearful and depressed ?Exploration of goals of care in the event of a sudden injury or illness  ?CODE STATUS: FULL CODE ? ?Symptom Management/Plan: ? ?Pain management: Discussed the fentanyl wean and I have left a detailed chart. I am weaning her by 12 mcg every 6 days with total d/c date 01/13/22. Sent to [facility] are 50 mcg fentanyl  topical #10 no refills, 25 mcg fentanyl patches  topical #5 now, #5 on 12/26/21, 12 mcg fentanyl topical  #5  now,  #5 on 12/26/21. Reviewed multiple times with husband and left detailed written directions. I have scanned and have PDF if needed. I emphatically instructed again not to cut patches. PDMP consulted. I also have permission from surgical team to assume prescribing for controlled substances. ? ?Constipation: Reports very uncomfortable, has gone 5-7 days. I have given a bowel program of daily miralax in the am, senna 1 bid and increase as needed, and MOM 30 ml on day 3 no bm.  This should also resolve some with the narcotic wean.  ? ?Mood: Endorses tearfulness at ordeal. No current mental health provider. Taking cymbalta currently. May benefit from [facility] if she can find a provider she would like. She had seen someone inpatient during her illness.  Discussed getting her dog to come back, even for visits or day time. Dog has been next door since Oct when she went into hospital. Has also d/c mid day dose of buspar, saying it was making her anxious. ? ?Wound: Intact with wound vac. Now area is , 3 cm. I asked her to ask home health when this will be d/c. iT is a tether for her and fall risk.  ? ?Follow up Palliative Care Visit: Palliative care will continue to follow for complex medical decision making, advance care planning, and clarification of goals. Return 4 weeks or prn. ? ?I  spent 60 minutes providing this consultation. More than 50% of the time in this consultation was spent in counseling and care coordination. ? ?PPS: 50% ? ?HOSPICE ELIGIBILITY/DIAGNOSIS: no ? ?Chief Complaint: debility, pain, depression ? ?HISTORY OF PRESENT ILLNESS:  [name] Robinson is a 71 y.o. year old female  with  depression, wound of sacrum, debility and deconditioning, chronic use of opioid and constipation s/p extensive abdominal bariatric surgery. Patient seen today to review palliative care needs to include medical decision making and advance care planning as appropriate.  ? ?History obtained from review of EMR, discussion with primary  team, and interview with family, facility staff/caregiver and/or [name] Robinson.  ?I reviewed available labs, medications, imaging, studies and related documents from the EMR.  Records reviewed and summarized above.  ? ?ROS ? ? ?General: NAD ?EYES: denies vision changes ?ENMT: denies dysphagia ?Cardiovascular: denies chest pain, denies DOE ?Pulmonary: denies cough, denies increased SOB ?Abdomen: endorses good appetite, endorses constipation, endorses continence of bowel ?GU: denies dysuria, endorses continence of urine ?MSK:  endorses  increased weakness,  no falls reported ?Skin: denies rashes, endorses sacral wound ?Neurological: denies pain, denies insomnia ?Psych: Endorses depressed and anxious mood ? ?Physical Exam: ?Current and past weights:  134 lbs, 4 lb gain. ?Constitutional: NAD, 118/72 HR 85 RR 20 ?General: frail appearing, thin ?EYES: anicteric sclera, lids intact, no discharge  ?ENMT: intact hearing, oral mucous membranes moist, dentition intact ?CV: S1S2, RRR, no LE edema ?Pulmonary: LCTA, no increased work of breathing, no cough, room air ?Abdomen: intake 50-75%, normo-active BS + 4 quadrants, soft and non tender, no ascites ?MSK: mod sarcopenia, moves all extremities, ambulatory with rollator ?Skin: warm and dry, no rashes or wounds on visible skin ?Neuro:  + generalized weakness,  + cognitive impairment, anxious affect ? ?Thank you for the opportunity to participate in the care of [name] Robinson.  The palliative care team will continue to follow. Please call our office at [phone] if we can be of additional assistance.  ? ?Jason Coop, NP DNP, AGPCNP-BC ? ?COVID-19 PATIENT SCREENING TOOL ?Asked and negative response unless otherwise noted:  ? ?Have you had symptoms of covid, tested positive or been in contact with someone with symptoms/positive test in the past 5-10 days?  ? ?

## 2021-11-30 LAB — MULTIPLE MYELOMA PANEL, SERUM
Albumin SerPl Elph-Mcnc: 3.4 g/dL (ref 2.9–4.4)
Albumin/Glob SerPl: 1.1 (ref 0.7–1.7)
Alpha 1: 0.3 g/dL (ref 0.0–0.4)
Alpha2 Glob SerPl Elph-Mcnc: 0.8 g/dL (ref 0.4–1.0)
B-Globulin SerPl Elph-Mcnc: 0.8 g/dL (ref 0.7–1.3)
Gamma Glob SerPl Elph-Mcnc: 1.4 g/dL (ref 0.4–1.8)
Globulin, Total: 3.3 g/dL (ref 2.2–3.9)
IgA: 145 mg/dL (ref 87–352)
IgG (Immunoglobin G), Serum: 1490 mg/dL (ref 586–1602)
IgM (Immunoglobulin M), Srm: 63 mg/dL (ref 26–217)
Total Protein ELP: 6.7 g/dL (ref 6.0–8.5)

## 2021-12-01 ENCOUNTER — Other Ambulatory Visit: Payer: Self-pay | Admitting: Nurse Practitioner

## 2021-12-01 LAB — METHYLMALONIC ACID, SERUM: Methylmalonic Acid, Quantitative: 231 nmol/L (ref 0–378)

## 2021-12-01 MED ORDER — LEVOTHYROXINE SODIUM 125 MCG PO TABS: 125.0000 ug | ORAL_TABLET | Freq: Every day | ORAL | 3 refills | Status: DC

## 2021-12-01 NOTE — Progress Notes (Signed)
Levothyroxine dose change to 125 MCG, decrease. ?

## 2021-12-06 ENCOUNTER — Telehealth: Payer: Self-pay | Admitting: Nurse Practitioner

## 2021-12-06 NOTE — Telephone Encounter (Signed)
Copied from [place] [phone]. Topic: General - Other ?>> Dec 06, 2021  2:33 PM Leward Quan A wrote: ?Reason for CRM: Patient husband called in to inquire of Marnee Guarneri can please check the medications that his wife is on and please refill what is due for a refill and discontinue the ones she does not need. Mr Landis Gandy can be reached at Ph# [phone] ?

## 2021-12-06 NOTE — Telephone Encounter (Signed)
Tried calling patient's husband, no answer and VM full so I could not leave a VM. Will try to call again.  ? ?OK for [facility] to relay Jolene's message to the patient's husband if he calls back.  ?

## 2021-12-11 DIAGNOSIS — R928 Other abnormal and inconclusive findings on diagnostic imaging of breast: Secondary | ICD-10-CM | POA: Insufficient documentation

## 2021-12-11 NOTE — Patient Instructions (Addendum)
Start taking Metamucil daily every morning and continue Senna twice a day. ?

## 2021-12-13 NOTE — Chronic Care Management (AMB) (Signed)
?  Care Management  ? ?Outreach Note ? ?12/13/2021 ?Name: Anahy Esh MRN: [id number] DOB: 05-02-1951 ? ?Referred by: Venita Lick, NP ?Reason for referral : Care Coordination (Outreach to schedule referral with RN CM and LCSW ) ? ? ?Third unsuccessful telephone outreach was attempted today. The patient was referred to the case management team for assistance with care management and care coordination. The patient's primary care provider has been notified of our unsuccessful attempts to make or maintain contact with the patient. The care management team is pleased to engage with this patient at any time in the future should he/she be interested in assistance from the care management team.  ? ?Follow Up Plan:  ?We have been unable to make contact with the patient for follow up. The care management team is available to follow up with the patient after provider conversation with the patient regarding recommendation for care management engagement and subsequent re-referral to the care management team.  ? ?Noreene Larsson, RMA ?Care Guide, Embedded Care Coordination ?[facility]  Care Management  ?[place], [place] [address] ?Direct Dial: [phone] ?Museum/gallery conservator.[email or web address] ?Website: [email or web address]  ? ?

## 2021-12-14 ENCOUNTER — Encounter: Payer: Self-pay | Admitting: Nurse Practitioner

## 2021-12-14 ENCOUNTER — Ambulatory Visit (INDEPENDENT_AMBULATORY_CARE_PROVIDER_SITE_OTHER): Payer: Medicare Other | Admitting: Nurse Practitioner

## 2021-12-14 VITALS — BP 95/66 | HR 90 | Temp 97.8°F | Ht 67.0 in | Wt 128.8 lb

## 2021-12-14 DIAGNOSIS — E063 Autoimmune thyroiditis: Secondary | ICD-10-CM

## 2021-12-14 DIAGNOSIS — L98422 Non-pressure chronic ulcer of back with fat layer exposed: Secondary | ICD-10-CM

## 2021-12-14 DIAGNOSIS — Z8619 Personal history of other infectious and parasitic diseases: Secondary | ICD-10-CM

## 2021-12-14 DIAGNOSIS — R29898 Other symptoms and signs involving the musculoskeletal system: Secondary | ICD-10-CM

## 2021-12-14 DIAGNOSIS — Z4689 Encounter for fitting and adjustment of other specified devices: Secondary | ICD-10-CM

## 2021-12-14 DIAGNOSIS — F419 Anxiety disorder, unspecified: Secondary | ICD-10-CM

## 2021-12-14 DIAGNOSIS — E440 Moderate protein-calorie malnutrition: Secondary | ICD-10-CM

## 2021-12-14 DIAGNOSIS — D508 Other iron deficiency anemias: Secondary | ICD-10-CM

## 2021-12-14 DIAGNOSIS — K5903 Drug induced constipation: Secondary | ICD-10-CM

## 2021-12-14 NOTE — Assessment & Plan Note (Signed)
With wound vac in place, continue collaboration with home health + continue to collaborate with surgeon at [facility]. ?

## 2021-12-14 NOTE — Assessment & Plan Note (Signed)
Continue collaboration with home health who is caring for wound vac at this time and collaboration with [facility] surgical team. ?

## 2021-12-14 NOTE — Assessment & Plan Note (Signed)
Present since bariatric surgery, exacerbated with recent hospitalization.  Continue supplements at home and collaboration with hematology. ?

## 2021-12-14 NOTE — Assessment & Plan Note (Signed)
Chronic, ongoing.  Continue current medication regimen and adjust as needed.  TSH and Free T4 today. 

## 2021-12-14 NOTE — Assessment & Plan Note (Signed)
Refer to protein calorie malnutrition plan of care.  Home health for PT/OT collaboration continues. ?

## 2021-12-14 NOTE — Assessment & Plan Note (Addendum)
Related to recent 4 month hospitalization and deconditioning, continues to lose.  Recommend starting lactose free supplements to 3-4 times daily, as Boost caused nausea.  Ensure eating frequent, small, healthy meals.  Labs today. ?

## 2021-12-14 NOTE — Assessment & Plan Note (Signed)
Ongoing recently. Recommend starting Metamucil daily in morning + continue Senna twice a day.  Eat more green leafy vegetables.  Currently palliative is reducing to discontinue her Fentanyl, which should be completed in June and will be beneficial to bowel pattern. ?

## 2021-12-14 NOTE — Assessment & Plan Note (Signed)
Chronic, exacerbated by recent 4 months in hospital and serious illness = PTSD aspect present.  Continue Celexa 20 MG daily and Buspar 15 MG TID.  Has Klonopin at home from hospital, recommend she use this for severe anxiety only and not daily.  Denies SI/HI.  Continue palliative collaboration. ?

## 2021-12-14 NOTE — Progress Notes (Signed)
? ?BP 95/66   Pulse 90   Temp 97.8 ?F (36.6 ?C) (Oral)   Ht '5\' 7"'$  (1.702 m)   Wt 128 lb 12.8 oz (58.4 kg)   SpO2 99%   BMI 20.17 kg/m?   ? ?Subjective:  ? ? Patient ID: [name] Robinson, female    DOB: [birth year], 71 y.o.   MRN: [id number] ? ?HPI: ?[name] Robinson is a 71 y.o. female ? ?Chief Complaint  ?Patient presents with  ? Weight Check  ? Anxiety  ? Constipation  ?  Patient says she is in pain from being constipated. Patient states she has not had a BM since 3 days ago, but it is small pieces and she is drinking a lot of water. Patient states she is currently taking Senakot to help with constipation.   ? ?CONSTIPATION ?Currently taking Senna daily, dealing with this due to Fentanyl.  Having BM every 3-4 days with straining and pellets. ?Status: fluctuating ?Treatments attempted: Senna and Colace ?Fever: no ?Nausea: no ?Vomiting: no ?Weight loss: yes ?Decreased appetite: no ?Diarrhea: no ?Constipation: yes ?Blood in stool: no ?Heartburn: no ?Jaundice: no ?Rash: no ?Dysuria/urinary frequency: no ?Hematuria: no ?History of sexually transmitted disease: no ?Recurrent NSAID use: no  ? ?WOUND & WEIGHT CHECK: ?Has wound vac in place, followed by home health.  Home health is going to house and palliative had follow-up on 11/29/21 with goal to reduce Fentanyl and discontinue date 01/13/22.  Followed by Dr. Melba Coon at bariatric surgery at [facility], last visit 12/13/21 -- plan per patient is he is going to follow-up with them via telephone every 2 weeks.   Home health OT once a week and PT twice a week continue to follow in home. ? ?Is eating meals at home -- 3 meals a day -- not drinking protein shakes.  Had been on TPN in hospital for period - hospitalized for 4 months.  PICC line and J Tube she was discharged home with have been discontinued.   ? ?Saw Dr. [name] for anemia on 11/28/21 -- thyroid levels were low with them and we changed thyroid medication to 125 MCG. ?Duration: months ?Location: sacral ?Pain: yes ?Quality:  yes ?Severity: mild ?Redness: no ?Swelling: no ?Oozing: no ?Pus: no ?Fevers: no ?Nausea/vomiting: no ?Status: stable ?Treatments attempted: wound vac at present and home health   ? ?ANXIETY/STRESS ?Taking Celexa 20 MG and Buspar 10 MG BID + Mirtazapine.  Having major issues with PTSD after 4 months in hospital due to some of the treatment in hospital with nursing not changing pads when wet or placing towel between her legs instead of cleaning her. ? ?Issues with Larkin Ina the care manager who discharged her without having all needs (including wound care needs) -- she has discussed this with Dr. Melba Coon who is working on reporting. ?Duration:uncontrolled ?Anxious mood: yes  ?Excessive worrying: yes ?Irritability: yes  ?Sweating: no ?Nausea: no ?Palpitations:no ?Hyperventilation: no ?Panic attacks: yes ?Agoraphobia: no  ?Obscessions/compulsions: no ?Depressed mood: yes ? ?  12/14/2021  ? 10:19 AM 11/16/2021  ? 11:01 AM 04/28/2021  ?  4:31 PM 02/28/2021  ? 10:25 AM 02/28/2021  ? 10:12 AM  ?Depression screen PHQ 2/9  ?Decreased Interest 1 1 0 0 0  ?Down, Depressed, Hopeless 1 1 0 0 0  ?PHQ - 2 Score 2 2 0 0 0  ?Altered sleeping 0 1  0   ?Tired, decreased energy 1 3  0   ?Change in appetite 0 1  0   ?Feeling bad or failure  about yourself  0 2  0   ?Trouble concentrating 1 1  0   ?Moving slowly or fidgety/restless 1 2  0   ?Suicidal thoughts 0 0  0   ?PHQ-9 Score 5 12  0   ?Difficult doing work/chores    Not difficult at all   ?Anhedonia: no ?Weight changes: no ?Insomnia: yes hard to fall asleep  ?Hypersomnia: no ?Fatigue/loss of energy: no ?Feelings of worthlessness: no ?Feelings of guilt: no ?Impaired concentration/indecisiveness: yes ?Suicidal ideations: no  ?Crying spells: yes ?Recent Stressors/Life Changes: yes ?  Relationship problems: no ?  Family stress: no   ?  Financial stress: no  ?  Job stress: no  ?  Recent death/loss:  hospital experience over 4 months   ? ?  12/14/2021  ? 10:19 AM 11/16/2021  ? 11:03 AM 02/28/2021  ? 10:25  AM  ?GAD 7 : Generalized Anxiety Score  ?Nervous, Anxious, on Edge 1 2 0  ?Control/stop worrying 1 1 0  ?Worry too much - different things 1 2 0  ?Trouble relaxing 1 2 0  ?Restless 0 2 0  ?Easily annoyed or irritable 1 2 0  ?Afraid - awful might happen 1 2 0  ?Total GAD 7 Score 6 13 0  ?Anxiety Difficulty Extremely difficult Somewhat difficult Not difficult at all  ? ?Relevant past medical, surgical, family and social history reviewed and updated as indicated. Interim medical history since our last visit reviewed. ?Allergies and medications reviewed and updated. ? ?Review of Systems  ?Constitutional:  Positive for fatigue. Negative for activity change, appetite change, diaphoresis and fever.  ?Respiratory:  Negative for cough, chest tightness, shortness of breath and wheezing.   ?Cardiovascular:  Negative for chest pain, palpitations and leg swelling.  ?Gastrointestinal: Negative.   ?Neurological:  Positive for weakness. Negative for dizziness, syncope, light-headedness, numbness and headaches.  ?Psychiatric/Behavioral:  Positive for sleep disturbance. Negative for decreased concentration, self-injury and suicidal ideas. The patient is nervous/anxious.   ? ?Per HPI unless specifically indicated above ? ?   ?Objective:  ?  ?BP 95/66   Pulse 90   Temp 97.8 ?F (36.6 ?C) (Oral)   Ht '5\' 7"'$  (1.702 m)   Wt 128 lb 12.8 oz (58.4 kg)   SpO2 99%   BMI 20.17 kg/m?   ?Wt Readings from Last 3 Encounters:  ?12/14/21 128 lb 12.8 oz (58.4 kg)  ?11/28/21 134 lb 1.6 oz (60.8 kg)  ?11/16/21 141 lb 6.4 oz (64.1 kg)  ?  ?Physical Exam ?Vitals and nursing note reviewed.  ?Constitutional:   ?   General: She is awake. She is not in acute distress. ?   Appearance: She is well-developed, well-groomed and underweight. She is not ill-appearing or toxic-appearing.  ?HENT:  ?   Head: Normocephalic.  ?   Right Ear: Hearing normal.  ?   Left Ear: Hearing normal.  ?Eyes:  ?   General: Lids are normal.     ?   Right eye: No discharge.     ?    Left eye: No discharge.  ?   Conjunctiva/sclera: Conjunctivae normal.  ?   Pupils: Pupils are equal, round, and reactive to light.  ?Neck:  ?   Thyroid: No thyromegaly.  ?   Vascular: No carotid bruit.  ?Cardiovascular:  ?   Rate and Rhythm: Normal rate and regular rhythm.  ?   Heart sounds: Normal heart sounds. No murmur heard. ?  No gallop.  ?Pulmonary:  ?   Effort:  Pulmonary effort is normal. No accessory muscle usage or respiratory distress.  ?   Breath sounds: Normal breath sounds.  ?Abdominal:  ?   General: Bowel sounds are normal. There is no distension.  ?   Palpations: Abdomen is soft.  ?Musculoskeletal:  ?   Cervical back: Normal range of motion and neck supple.  ?   Right lower leg: No edema.  ?   Left lower leg: No edema.  ?Lymphadenopathy:  ?   Head:  ?   Right side of head: No submental, submandibular, tonsillar, preauricular or posterior auricular adenopathy.  ?   Left side of head: No submental, submandibular, tonsillar, preauricular or posterior auricular adenopathy.  ?   Cervical: No cervical adenopathy.  ?Skin: ?   General: Skin is warm and dry.  ?   Findings: Bruising (scattered to upper extremities) and wound present.  ?   Comments: Wound vac present and intact.  ?Neurological:  ?   Mental Status: She is alert and oriented to person, place, and time.  ?   Cranial Nerves: Cranial nerves 2-12 are intact.  ?   Motor: Weakness present.  ?   Deep Tendon Reflexes: Reflexes are normal and symmetric.  ?   Reflex Scores: ?     Brachioradialis reflexes are 2+ on the right side and 2+ on the left side. ?     Patellar reflexes are 2+ on the right side and 2+ on the left side. ?Psychiatric:     ?   Attention and Perception: Attention normal.     ?   Mood and Affect: Mood normal.     ?   Speech: Speech normal.     ?   Behavior: Behavior normal. Behavior is cooperative.     ?   Thought Content: Thought content normal.  ? ?Results for orders placed or performed in visit on 11/29/21  ?T4, free  ?Result Value Ref  Range  ? Free T4 1.24 (H) 0.61 - 1.12 ng/dL  ? ?   ?Assessment & Plan:  ? ?Problem List Items Addressed This Visit   ? ?  ? Digestive  ? Drug induced constipation  ?  Ongoing recently. Recommend starting Metamucil daily

## 2021-12-14 NOTE — Assessment & Plan Note (Signed)
Stabilizing at this time and improved from recent visit, although weight loss continues.  Have recommend supplements at home 3 times a day and will continue Mirtazapine.   ?

## 2021-12-15 ENCOUNTER — Encounter: Payer: Self-pay | Admitting: Nurse Practitioner

## 2021-12-15 LAB — CBC WITH DIFFERENTIAL/PLATELET
Basophils Absolute: 0 10*3/uL (ref 0.0–0.2)
Basos: 1 %
EOS (ABSOLUTE): 0 10*3/uL (ref 0.0–0.4)
Eos: 1 %
Hematocrit: 38.5 % (ref 34.0–46.6)
Hemoglobin: 12.7 g/dL (ref 11.1–15.9)
Immature Grans (Abs): 0 10*3/uL (ref 0.0–0.1)
Immature Granulocytes: 0 %
Lymphocytes Absolute: 0.9 10*3/uL (ref 0.7–3.1)
Lymphs: 25 %
MCH: 29.5 pg (ref 26.6–33.0)
MCHC: 33 g/dL (ref 31.5–35.7)
MCV: 90 fL (ref 79–97)
Monocytes Absolute: 0.2 10*3/uL (ref 0.1–0.9)
Monocytes: 6 %
Neutrophils Absolute: 2.4 10*3/uL (ref 1.4–7.0)
Neutrophils: 67 %
Platelets: 178 10*3/uL (ref 150–450)
RBC: 4.3 x10E6/uL (ref 3.77–5.28)
RDW: 11.8 % (ref 11.7–15.4)
WBC: 3.5 10*3/uL (ref 3.4–10.8)

## 2021-12-15 LAB — COMPREHENSIVE METABOLIC PANEL
ALT: 12 IU/L (ref 0–32)
AST: 16 IU/L (ref 0–40)
Albumin/Globulin Ratio: 1.5 (ref 1.2–2.2)
Albumin: 4.5 g/dL (ref 3.8–4.8)
Alkaline Phosphatase: 92 IU/L (ref 44–121)
BUN/Creatinine Ratio: 30 — ABNORMAL HIGH (ref 12–28)
BUN: 18 mg/dL (ref 8–27)
Bilirubin Total: 0.3 mg/dL (ref 0.0–1.2)
CO2: 23 mmol/L (ref 20–29)
Calcium: 10.4 mg/dL — ABNORMAL HIGH (ref 8.7–10.3)
Chloride: 103 mmol/L (ref 96–106)
Creatinine, Ser: 0.6 mg/dL (ref 0.57–1.00)
Globulin, Total: 3.1 g/dL (ref 1.5–4.5)
Glucose: 63 mg/dL — ABNORMAL LOW (ref 70–99)
Potassium: 4.2 mmol/L (ref 3.5–5.2)
Sodium: 144 mmol/L (ref 134–144)
Total Protein: 7.6 g/dL (ref 6.0–8.5)
eGFR: 97 mL/min/{1.73_m2} (ref >59)

## 2021-12-15 LAB — T4, FREE: Free T4: 1.84 ng/dL — ABNORMAL HIGH (ref 0.82–1.77)

## 2021-12-15 LAB — TSH: TSH: 0.056 u[IU]/mL — ABNORMAL LOW (ref 0.450–4.500)

## 2021-12-15 MED ORDER — LEVOTHYROXINE SODIUM 100 MCG PO TABS: 100.0000 ug | ORAL_TABLET | Freq: Every day | ORAL | 12 refills | Status: DC

## 2021-12-15 NOTE — Progress Notes (Signed)
Good day everyone, I do not believe patient consistently checks MyChart, so please call and alert her to following: ?- Kidney function, creatinine and eGFR, remains normal, as is liver function, AST and ALT.   ?- Thyroid labs remain more hyperthyroid level, overactive -- which can cause weight loss.  This time they were even more overactive then previous.  I would like to reduce Levothyroxine further to 100 MCG, STOP 125 MCG.  I have sent in only 30 day supply with refills as we may need to adjust further.  You will need lab only visit in 6 weeks to recheck, my staff can schedule this for you. ?- Anemia, on CBC, shows improvement this check.  Continue supplements.  Any questions?  Please schedule lab only visit.  Thank you. ?Keep being amazing!!  Thank you for allowing me to participate in your care.  I appreciate you. ?Kindest regards, ?Jasa Dundon ?

## 2021-12-15 NOTE — Addendum Note (Signed)
Addended by: Marnee Guarneri T on: 12/15/2021 04:54 PM ? ? Modules accepted: Orders ? ?

## 2021-12-15 NOTE — Addendum Note (Signed)
Addended by: Marnee Guarneri T on: 12/15/2021 04:59 PM ? ? Modules accepted: Orders ? ?

## 2021-12-20 ENCOUNTER — Telehealth: Payer: Self-pay | Admitting: Oncology

## 2021-12-20 NOTE — Telephone Encounter (Signed)
New pt lab follow up appt. Please advise?  ? ?

## 2021-12-20 NOTE — Telephone Encounter (Signed)
Patient called and wanted to confirm whether her appointment on 5/11 is absolutely necessary, she stated that her PCP has reviewed her lab results with her. She has a hard time "traveling" and wants to minimize her appointments.  ? ?I did offer her a virtual visit but she declined.  ?

## 2021-12-20 NOTE — Telephone Encounter (Signed)
Per Dr. [name] hemoglobin has normalized and she can continue to follow up with PCP regarding thyroid issues. PT inform and verbalized understanding.  ? ?Please cancel appt on 5/11. Pt aware that we will cancel appt.  ?

## 2021-12-22 ENCOUNTER — Inpatient Hospital Stay: Payer: Medicare Other | Admitting: Oncology

## 2021-12-26 ENCOUNTER — Other Ambulatory Visit: Payer: Medicare Other

## 2021-12-26 DIAGNOSIS — Z515 Encounter for palliative care: Secondary | ICD-10-CM

## 2021-12-26 NOTE — Progress Notes (Signed)
PATIENT NAME: [name] Robinson ?DOB: 26-Dec-1950 ?MRN: [id number] ? ?PRIMARY CARE PROVIDER: Venita Lick, NP ? ?RESPONSIBLE PARTY:  ?Acct ID - Guarantor Home Phone Work Phone Relationship Acct Type  ?[id number] [name] Robinson, [name] Robinson* [phone]  Self P/F  ?   [address] [place], [place] [address]  ? ? ?Telephonic visit completed with patient at the request of Ralene Bathe, NP. ? ?Constipation:  Continues to be an issues on occasion.  Patient is using a fleets enema as needed.  She continues with miralax and senna s daily. ? ?Fatigue/Depression:  Patient endorses tearful behaviors and fatigue.  Unable to be with family for more than 2 hours yesterday.  Patient advised she is hard on herself as she was always the "doer".  She continues with Cymbalta but it sounds like she recently decreased her dosage to a 1/2. Patient states she often feels she needs something mid-day.  Will notify NP of this.  ? ?Pain Management:  Patient advised fentanyl is down to 25 mcg every 72 hours now.  She will be completely off of fentanyl by the end of the month. ? ?Weight loss:  Patient endorses a weight of 124 lbs today.  4 lb decrease since her PCP visit on 5/3.  Patient endorses eating 3 meals daily and also having protein bars. ? ?Wound:  Wound vac was discontinued last week and only requiring dressings. ? ?Patient confirmed her visit with Palliative Care NP on Monday at 1015 am.   Update provided to Ralene Bathe, NP.  ? ? ? ? ? ?Lorenza Burton, RN ? ?

## 2022-01-02 ENCOUNTER — Other Ambulatory Visit: Payer: Medicare Other | Admitting: Primary Care

## 2022-01-02 ENCOUNTER — Other Ambulatory Visit: Payer: Self-pay | Admitting: Nurse Practitioner

## 2022-01-02 DIAGNOSIS — Z515 Encounter for palliative care: Secondary | ICD-10-CM

## 2022-01-02 DIAGNOSIS — F419 Anxiety disorder, unspecified: Secondary | ICD-10-CM

## 2022-01-02 DIAGNOSIS — F1190 Opioid use, unspecified, uncomplicated: Secondary | ICD-10-CM

## 2022-01-02 DIAGNOSIS — R29898 Other symptoms and signs involving the musculoskeletal system: Secondary | ICD-10-CM

## 2022-01-02 MED ORDER — LEVOTHYROXINE SODIUM 50 MCG PO TABS: 50.0000 ug | ORAL_TABLET | Freq: Every day | ORAL | 3 refills | Status: DC

## 2022-01-02 NOTE — Progress Notes (Signed)
[facility] Consult Note Telephone: [phone]  Fax: [phone]    Date of encounter: 01/02/22 11:21 AM PATIENT NAME: [name] Robinson [address] [place] [facility] [address]   [phone] (home)  DOB: [birth year] MRN: [id number] PRIMARY CARE PROVIDER:    Venita Lick, NP,  [place] [address] [phone]  REFERRING PROVIDER:   Venita Lick, NP [address] [place],  [place] [address] [phone]  RESPONSIBLE PARTY:    Contact Information     Name Relation Home Work Mobile   [name] Robinson, [place] Spouse   [phone]   Abeeha Robinson,[name] Daughter   [phone]        I met face to face with patient and family in  home. Palliative Care was asked to follow this patient by consultation request of  Venita Lick, NP to address advance care planning and complex medical decision making. This is a follow up visit.                                   ASSESSMENT AND PLAN / RECOMMENDATIONS:   Advance Care Planning/Goals of Care: Goals include to maximize quality of life and symptom management.  Exploration of personal, cultural or spiritual beliefs that might influence medical decisions Identification of a healthcare agent- husband Wants to feel better from depression and anxiety.  CODE STATUS: FUll  Symptom Management/Plan:  D3: Not sure if she's been taking 50 000 weekly or 1000 daily. Please draw a D3. I would recommend simplifying medication regimens due to axiety.  Thyroid: Has 100 mcg tabs, new order from pcp reiterated to take 50 mcg daily. Endorses palpitations, crying, feeing very antsy.  Depression: Endorses fatigue, anxiety. Discussed ptsd, need for help from expert. PCP will also address in the short term until she can find a mental health professional. Finding someone has been difficult and she is reticent to go out to appts and states she cannot use telemed. She endorses she cries and has no energy but  does not feel like harming herself  Pain control: Is proceeding  on schedule with my narcotic wean protocol. Will be fully weaned on 6/2. Does c/o of some occ MSK pain, instructed to treat with otc.  I left written instruction in the home and had clarified wean with [facility] pharmacist. On my arrival she voiced upset that I said I would call and didn't . I let her know my nurse Ether Griffins RN had called to check on her and her narcotic wean. She has no memory of this documented TM (telephonic).   Nutrition: continues to lose weight, eats a lot of carbs, and is taking supplements.  I am concerned about her ongoing weight loss.  She becomes tearful discussing her intake as  she feels she's doing the best she can on intake.   Follow up Palliative Care Visit: Palliative care will continue to follow for complex medical decision making, advance care planning, and clarification of goals. Return 6 weeks or prn.   This visit was coded based on medical decision making (MDM).  PPS: 40%  HOSPICE ELIGIBILITY/DIAGNOSIS: TBD  Chief Complaint: depression, debility,   HISTORY OF PRESENT ILLNESS:  Kesa Birky is a 71 y.o. year old female  with depression, metabolic disturbance, induced hyperthyroid, chronic opioid use . Patient seen today to review palliative care needs to include medical decision making and advance care planning as appropriate.  History obtained from review of EMR, discussion with primary team, and interview with family, facility staff/caregiver and/or [name] Robinson.  I reviewed available labs, medications, imaging, studies and related documents from the EMR.  Records reviewed and summarized above.   ROS   General: NAD ENMT: denies dysphagia Cardiovascular: denies chest pain, denies DOE Pulmonary: denies cough, denies increased SOB Abdomen: endorses fair appetite, endorses occ constipation, endorses continence of bowel GU: denies dysuria, endorses continence of urine MSK:  endorses   increased weakness,  no falls reported Skin: denies rashes, endorses healing sacral  wounds Neurological: endorses occ pain, endorses insomnia Psych: Endorses anxious and depressed mood  Physical Exam: Current and past weights: 122 lbs, losing. States she's taking supplements.  Constitutional: NAD, 117/78 Hr 91 RR 18  General: frail appearing, thin EYES: anicteric sclera, lids intact, no discharge  ENMT: intact hearing, oral mucous membranes moist, dentition intact CV: S1S2, RRR, no LE edema Pulmonary: LCTA, no increased work of breathing, no cough, room air Abdomen: intake 50%,soft and non tender, no ascites MSK:+ sarcopenia, moves all extremities, ambulatory with walker  Skin: warm and dry, no rashes or wounds on visible skin Neuro: + generalized weakness,  mild cognitive impairment, depressed and anxious affect   Thank you for the opportunity to participate in the care of Ms. [name] Robinson.  The palliative care team will continue to follow. Please call our office at [phone] if we can be of additional assistance.   Jason Coop, NP DNP, AGPCNP-BC  COVID-19 PATIENT SCREENING TOOL Asked and negative response unless otherwise noted:   Have you had symptoms of covid, tested positive or been in contact with someone with symptoms/positive test in the past 5-10 days?

## 2022-01-03 ENCOUNTER — Telehealth: Payer: Self-pay

## 2022-01-03 NOTE — Telephone Encounter (Signed)
Copied from [facility] [phone]. Topic: General - Other >> Jan 03, 2022 11:14 AM Valere Dross wrote: Reason for CRM: Pt called in stating the Psychiatric referral that she was given her appt isn't until 06/15, she also requested if she could get another referral to one that could get her in sooner, please advise.

## 2022-01-04 ENCOUNTER — Ambulatory Visit: Payer: Self-pay | Admitting: *Deleted

## 2022-01-04 NOTE — Telephone Encounter (Addendum)
Reason for Disposition  [1] Caller requesting NON-URGENT health information AND [2] PCP's office is the best resource  Answer Assessment - Initial Assessment Questions 1. REASON FOR CALL or QUESTION: "What is your reason for calling today?" or "How can I best help you?" or "What question do you have that I can help answer?"     Christy Reel nurse with [facility] called in.  She is requesting a thyroid panel to be drawn on pt.   She is crying and sleeping a lot.   Her thyroid medication was decreased to 50 mcg.   Her labs in March showed her having a low thyroid.  Pt has an appt next week with Marnee Guarneri, NP.  If there are other labs Jolene would like drawn with this thyroid panel they will be glad to draw those too.  Alyse Low can be reached at [phone].   She's with pt now.  Protocols used: Information Only Call - No Triage-A-AH I called into [facility] and spoke with Alexis.  She is getting this information to Jolene.   I let her know the home care nurse was with the pt now.

## 2022-01-04 NOTE — Telephone Encounter (Signed)
Attempted to call [name] Robinson back to get information on how Jolene should go about ordering the labs for [facility] to draw. Unable to leave vm due to full mailbox.   If she calls back please obtain this information and route it back to our pool.

## 2022-01-05 NOTE — Telephone Encounter (Signed)
Called patient and unable to leave a message for the patient to give note from referral coordinator due to patient's voicemail box being full and not accepting any messages.   OK for [facility]/Nurse Triage to give note if patient calls back.

## 2022-01-06 ENCOUNTER — Telehealth: Payer: Self-pay | Admitting: Nurse Practitioner

## 2022-01-06 NOTE — Telephone Encounter (Signed)
Alyse Low, RN at [facility] called about orders for the patient. Advised per Henrine Screws, NP on 01/04/22, Christy verbalized understanding and says she's taking this as the verbal order and is going to draw the blood work now. She says the results will be faxed to the office so that the provider will have it. Fax Number  [phone] provided.   Marnee Guarneri T, NP 01/04/22 11:58 AM Yes, if they can obtain TSH and Free T4 that would be amazing.  Let me know how I need to go about ordering for them.:)

## 2022-01-06 NOTE — Telephone Encounter (Signed)
error 

## 2022-01-08 NOTE — Patient Instructions (Signed)

## 2022-01-11 ENCOUNTER — Encounter: Payer: Self-pay | Admitting: Nurse Practitioner

## 2022-01-11 ENCOUNTER — Ambulatory Visit (INDEPENDENT_AMBULATORY_CARE_PROVIDER_SITE_OTHER): Payer: Medicare Other | Admitting: Nurse Practitioner

## 2022-01-11 ENCOUNTER — Telehealth: Payer: Self-pay

## 2022-01-11 VITALS — BP 117/66 | Wt 122.0 lb

## 2022-01-11 DIAGNOSIS — F419 Anxiety disorder, unspecified: Secondary | ICD-10-CM

## 2022-01-11 DIAGNOSIS — R29898 Other symptoms and signs involving the musculoskeletal system: Secondary | ICD-10-CM

## 2022-01-11 DIAGNOSIS — E063 Autoimmune thyroiditis: Secondary | ICD-10-CM

## 2022-01-11 DIAGNOSIS — L98422 Non-pressure chronic ulcer of back with fat layer exposed: Secondary | ICD-10-CM | POA: Diagnosis not present

## 2022-01-11 DIAGNOSIS — Z8619 Personal history of other infectious and parasitic diseases: Secondary | ICD-10-CM | POA: Diagnosis not present

## 2022-01-11 DIAGNOSIS — E440 Moderate protein-calorie malnutrition: Secondary | ICD-10-CM | POA: Diagnosis not present

## 2022-01-11 DIAGNOSIS — D510 Vitamin B12 deficiency anemia due to intrinsic factor deficiency: Secondary | ICD-10-CM

## 2022-01-11 MED ORDER — LEVOTHYROXINE SODIUM 25 MCG PO TABS: 25.0000 ug | ORAL_TABLET | Freq: Every day | ORAL | 6 refills | Status: DC

## 2022-01-11 NOTE — Assessment & Plan Note (Signed)
Chronic, ongoing.  Continue current supplement and collaboration with hematology as needed.  Recheck labs next visit.

## 2022-01-11 NOTE — Assessment & Plan Note (Addendum)
Stabilizing at this time and improved from recent visit, although weight loss continues.  Have recommend continue supplements at home 3-4 times a day and will continue Mirtazapine.  Is scheduled to see psychiatry in July, which will be beneficial due to PTSD after hospitalization for 4 months.  Continue collaboration with palliative who is reducing patient off Fentanyl patches she was sent home with.

## 2022-01-11 NOTE — Assessment & Plan Note (Signed)
Improving per patient report.  Continue collaboration with home health who is caring for wound, currently wound vac is off, and collaboration with [facility] surgical team. 

## 2022-01-11 NOTE — Assessment & Plan Note (Signed)
Chronic, has been more hyper on recent labs and had multiple medication reductions -- was concerned about this and reassured her today. Recommend she go back to taking the 50 MCG tablets, do not take 100 MCG tablets.  Will attempt to obtain labs from home health, have reached out to [facility] staff to check on this.  Adjust medication further as needed.

## 2022-01-11 NOTE — Addendum Note (Signed)
Addended by: Marnee Guarneri T on: 01/11/2022 01:41 PM   Modules accepted: Orders

## 2022-01-11 NOTE — Assessment & Plan Note (Signed)
Refer to protein calorie malnutrition plan of care.  Home health for PT/OT collaboration continues.

## 2022-01-11 NOTE — Progress Notes (Signed)
Home health thyroid labs = 0.055 (range 0.450-4.500) and her Free T4 - 1.53 (range 0.82-1.77).   Reducing thyroid medication to 25 MCG daily.  Will have staff alert patient.

## 2022-01-11 NOTE — Assessment & Plan Note (Signed)
Continues to have some loss, suspect some related to current more hyperthyroid and medications are being adjusted -- had at length discussion with patient about this. Also related to 4 month hospitalization and deconditioning, continues to lose.  Recommend continue lactose free supplements 3-4 times daily, as Boost caused nausea.  Ensure eating frequent, small, healthy meals.  Labs next visit.

## 2022-01-11 NOTE — Telephone Encounter (Signed)
Spoke with patient and made her aware of Jolene's recommendations. Patient verbalized understanding and says she will have her husband go and pick up her prescription.

## 2022-01-11 NOTE — Telephone Encounter (Signed)
-----   Message from Venita Lick, NP sent at 01/11/2022  1:36 PM EDT ----- Yes, I am aware -- some of the crying is related to her thyroid being hyperthyroid still and also related to being reduced off Fentanyl.  Spoke to patient at length today.  We are going to reduce her thyroid medicine to 25 MCG + she is scheduled for psychiatry in July, lots of PTSD present.    Please call and alert patient we are reducing medication - to start the 25 MCG pills as her thyroid is still too overactive (which as we discussed can lead to worsening anxiety and ongoing weight loss) -- do not take 50 MCG or 100 MCG pills, take only the 25 MCG pills.  We will recheck labs at her 4 week visit.   ----- Message ----- From: Irena Reichmann, CMA Sent: 01/11/2022   1:24 PM EDT To: Venita Lick, NP  Spoke with Alyse Low, RN at [facility] and she says the patient results for her recent labs results were TSH - 0.055 (range 0.450-4.500) and her Free T4 - 1.53 (range 0.82-1.77). She says that she was currently driving but she would give their office a call also to have them to re-fax the lab results over as well. She also asked if the patient mentioned during her visit if she has been weeping and crying a lot. She says patient is very depressed and wanted to know if the patient mentioned this in her visit. Please advise? ----- Message ----- From: Venita Lick, NP Sent: 01/11/2022  12:10 PM EDT To: Irena Reichmann, CMA  Please call home health nurse as we need these ASAP and nurse told them she sent yesterday.  I will be leaving to go out of town Friday and need to see them to determine if dose adjustment needed.  Thanks ----- Message ----- From: Irena Reichmann, CMA Sent: 01/11/2022  11:37 AM EDT To: Venita Lick, NP  There are no labs in the chart nor have they been received for the patient. Could she have them fax them over next home visit with patient. Please advise? ----- Message ----- From: Venita Lick, NP Sent: 01/11/2022  11:36 AM EDT To: [facility]  Can you check and see if home health faxed her lab orders for thyroid?  I need those ASAP.

## 2022-01-11 NOTE — Progress Notes (Signed)
BP 117/66   Wt 122 lb (55.3 kg)   BMI 19.11 kg/m    Subjective:    Patient ID: [name] Robinson, female    DOB: [birth year], 71 y.o.   MRN: [id number]  HPI: [name] Robinson is a 71 y.o. female  Chief Complaint  Patient presents with   Wound Check   Weight Check   Depression   This visit was completed via video visit through MyChart due to the restrictions of the COVID-19 pandemic. All issues as above were discussed and addressed. Physical exam was done as above through visual confirmation on video through MyChart. If it was felt that the patient should be evaluated in the office, they were directed there. The patient verbally consented to this visit. Location of the patient: home Location of the provider: home Those involved with this call:  Provider: Marnee Guarneri, DNP CMA: Irena Reichmann, [place] Desk/Registration: [employer]  Time spent on call:  21 minutes with patient face to face via video conference. More than 50% of this time was spent in counseling and coordination of care. 15 minutes total spent in review of patient's record and preparation of their chart.  I verified patient identity using two factors (patient name and date of birth). Patient consents verbally to being seen via telemedicine visit today.    HYPOTHYROIDISM Labs more hyper recently and medication has been reduced multiple times, recent labs obtained from home health, but have not receive results.  Currently to be taking 50 MCG Levothyroxine -- was taking 50 MCG when had recent labs.  Started taking 100 MCG on Monday because she was crying a lot. Thyroid control status:uncontrolled Satisfied with current treatment? yes Medication side effects: no Medication compliance: good compliance Etiology of hypothyroidism: Hashimoto's Recent dose adjustment:yes Fatigue: no Cold intolerance: no Heat intolerance: no Weight gain: no Weight loss: yes Constipation: yes Diarrhea/loose stools: no Palpitations:  with panic Lower extremity edema: no Anxiety/depressed mood: yes   WOUND & WEIGHT CHECK: Wound vac has been off two weeks, overall wound is improving.  Home health is going to house and palliative had follow-up on 01/02/22 with goal to reduce Fentanyl and discontinue by date 01/13/22.    Followed by Dr. Melba Coon at bariatric surgery at [facility], last visit 12/28/21 -- plan per patient is he is going to follow-up with them via telephone every 2 weeks.   Home health OT once a week and PT once a week continue to follow in home.   Is eating meals at home -- 3 meals a day -- not drinking protein shakes.  Had been on TPN in hospital for period - hospitalized for 4 months.     Saw Dr. [name] for anemia on 11/28/21. Duration: months Location: sacral Pain: improving Quality: yes Severity: mild Redness: no Swelling: no Oozing: no Pus: no Fevers: no Nausea/vomiting: no Status: stable Treatments attempted: wound vac at present and home health     ANXIETY/STRESS Taking Celexa 20 MG and Buspar 15 MG TID + Mirtazapine 15 MG at night.  Has Klonopin sent home from hospital, but is not using.  She has a referral into psychiatry for further recommendations -- she is scheduled for July 13th.  Having major issues with PTSD after 4 months in hospital due to some of the treatment in hospital with nursing not changing pads when wet or placing towel between her legs instead of cleaning her.   Issues with Larkin Ina the care manager who discharged her without having all needs met (including  wound care needs). Duration:uncontrolled Anxious mood: yes  Excessive worrying: yes Irritability: yes  Sweating: no Nausea: no Palpitations:no Hyperventilation: no Panic attacks: yes Agoraphobia: no  Obscessions/compulsions: no Depressed mood: yes    01/11/2022   11:17 AM 12/14/2021   10:19 AM 11/16/2021   11:01 AM 04/28/2021    4:31 PM 02/28/2021   10:25 AM  Depression screen PHQ 2/9  Decreased Interest _0 0 0  Down,  Depressed, Hopeless _1 0 0  PHQ - 2 Score _2 0 0  Altered sleeping 2 0 1  0  Tired, decreased energy _3 0  Change in appetite 0 0 1  0  Feeling bad or failure about yourself  0 0 2  0  Trouble concentrating 0 1 1  0  Moving slowly or fidgety/restless _4 0  Suicidal thoughts 0 0 0  0  PHQ-9 Score _5 0  Difficult doing work/chores Extremely dIfficult    Not difficult at all       01/11/2022   11:20 AM 12/14/2021   10:19 AM 11/16/2021   11:03 AM 02/28/2021   10:25 AM  GAD 7 : Generalized Anxiety Score  Nervous, Anxious, on Edge _6 0  Control/stop worrying _7 0  Worry too much - different things _8 0  Trouble relaxing _9 0  Restless 3 0 2 0  Easily annoyed or irritable _10 0  Afraid - awful might happen _11 0  Total GAD 7 Score _12 0  Anxiety Difficulty Very difficult Extremely difficult Somewhat difficult Not difficult at all    Relevant past medical, surgical, family and social history reviewed and updated as indicated. Interim medical history since our last visit reviewed. Allergies and medications reviewed and updated.  Review of Systems  Constitutional:  Negative for activity change, appetite change, diaphoresis, fatigue and fever.  Respiratory:  Negative for cough, chest tightness, shortness of breath and wheezing.   Cardiovascular:  Negative for chest pain, palpitations and leg swelling.  Gastrointestinal: Negative.   Neurological:  Positive for weakness. Negative for dizziness, syncope, light-headedness, numbness and headaches.  Psychiatric/Behavioral:  Positive for sleep disturbance. Negative for decreased concentration, self-injury and suicidal ideas. The patient is nervous/anxious.    Per HPI unless specifically indicated above     Objective:    BP 117/66   Wt 122 lb (55.3 kg)   BMI 19.11 kg/m   Wt Readings from Last 3 Encounters:  01/11/22 122 lb (55.3 kg)  12/14/21 128 lb 12.8 oz (58.4 kg)  11/28/21 134 lb 1.6 oz (60.8  kg)    Physical Exam  Unable to perform due to telephone visit only.  Results for orders placed or performed in visit on 12/14/21  CBC with Differential/Platelet  Result Value Ref Range   WBC 3.5 3.4 - 10.8 x10E3/uL   RBC 4.30 3.77 - 5.28 x10E6/uL   Hemoglobin 12.7 11.1 - 15.9 g/dL   Hematocrit 38.5 34.0 - 46.6 %   MCV 90 79 - 97 fL   MCH 29.5 26.6 - 33.0 pg   MCHC 33.0 31.5 - 35.7 g/dL   RDW 11.8 11.7 - 15.4 %   Platelets 178 150 - 450 x10E3/uL   Neutrophils 67 Not Estab. %   Lymphs 25 Not Estab. %   Monocytes 6 Not Estab. %   Eos 1 Not Estab. %  Basos 1 Not Estab. %   Neutrophils Absolute 2.4 1.4 - 7.0 x10E3/uL   Lymphocytes Absolute 0.9 0.7 - 3.1 x10E3/uL   Monocytes Absolute 0.2 0.1 - 0.9 x10E3/uL   EOS (ABSOLUTE) 0.0 0.0 - 0.4 x10E3/uL   Basophils Absolute 0.0 0.0 - 0.2 x10E3/uL   Immature Granulocytes 0 Not Estab. %   Immature Grans (Abs) 0.0 0.0 - 0.1 x10E3/uL  Comprehensive metabolic panel  Result Value Ref Range   Glucose 63 (L) 70 - 99 mg/dL   BUN 18 8 - 27 mg/dL   Creatinine, Ser 0.60 0.57 - 1.00 mg/dL   eGFR 97 >59 mL/min/1.73   BUN/Creatinine Ratio 30 (H) 12 - 28   Sodium 144 134 - 144 mmol/L   Potassium 4.2 3.5 - 5.2 mmol/L   Chloride 103 96 - 106 mmol/L   CO2 23 20 - 29 mmol/L   Calcium 10.4 (H) 8.7 - 10.3 mg/dL   Total Protein 7.6 6.0 - 8.5 g/dL   Albumin 4.5 3.8 - 4.8 g/dL   Globulin, Total 3.1 1.5 - 4.5 g/dL   Albumin/Globulin Ratio 1.5 1.2 - 2.2   Bilirubin Total 0.3 0.0 - 1.2 mg/dL   Alkaline Phosphatase 92 44 - 121 IU/L   AST 16 0 - 40 IU/L   ALT 12 0 - 32 IU/L  TSH  Result Value Ref Range   TSH 0.056 (L) 0.450 - 4.500 uIU/mL  T4, free  Result Value Ref Range   Free T4 1.84 (H) 0.82 - 1.77 ng/dL      Assessment & Plan:   Problem List Items Addressed This Visit       Endocrine   Hashimoto's thyroiditis    Chronic, has been more hyper on recent labs and had multiple medication reductions -- was concerned about this and reassured her  today. Recommend she go back to taking the 50 MCG tablets, do not take 100 MCG tablets.  Will attempt to obtain labs from home health, have reached out to [facility] staff to check on this.  Adjust medication further as needed.           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  Continue collaboration with home health who is caring for wound, currently wound vac is off, and collaboration with [facility] surgical team.         Other   Anxiety    Chronic, exacerbated by 4 months in hospital and serious illness = PTSD aspect present.  Continue Celexa 20 MG daily and Buspar 15 MG TID.  Has Klonopin at home from hospital, recommend she use this for severe anxiety only and not daily.  Denies SI/HI.  Continue palliative collaboration. Is scheduled to see psychiatry upcoming in July, which will be beneficial.       History of septic shock    Stabilizing at this time and improved from recent visit, although weight loss continues.  Have recommend continue supplements at home 3-4 times a day and will continue Mirtazapine.  Is scheduled to see psychiatry in July, which will be beneficial due to PTSD after hospitalization for 4 months.  Continue collaboration with palliative who is reducing patient off Fentanyl patches she was sent home with.       Moderate protein-calorie malnutrition ([place])    Continues to have some loss, suspect some related to current more hyperthyroid and medications are being adjusted -- had at length discussion  with patient about this. Also related to 4 month hospitalization and deconditioning, continues to lose.  Recommend continue lactose free supplements 3-4 times daily, as Boost caused nausea.  Ensure eating frequent, small, healthy meals.  Labs next visit.       Pernicious anemia    Chronic, ongoing.  Continue current supplement and collaboration with hematology as needed.  Recheck labs next visit.        Severe muscle deconditioning    Refer to protein calorie malnutrition plan of care.  Home health for PT/OT collaboration continues.        I discussed the assessment and treatment plan with the patient. The patient was provided an opportunity to ask questions and all were answered. The patient agreed with the plan and demonstrated an understanding of the instructions.   The patient was advised to call back or seek an in-person evaluation if the symptoms worsen or if the condition fails to improve as anticipated.   I provided 21+ minutes of time during this encounter.   Follow up plan: Return in about 4 weeks (around 02/08/2022) for WEIGHT CHECK, MOOD, THYROID, WOUND.

## 2022-01-11 NOTE — Assessment & Plan Note (Signed)
Chronic, exacerbated by 4 months in hospital and serious illness = PTSD aspect present.  Continue Celexa 20 MG daily and Buspar 15 MG TID.  Has Klonopin at home from hospital, recommend she use this for severe anxiety only and not daily.  Denies SI/HI.  Continue palliative collaboration. Is scheduled to see psychiatry upcoming in July, which will be beneficial.

## 2022-01-13 ENCOUNTER — Telehealth: Payer: Self-pay | Admitting: Nurse Practitioner

## 2022-01-13 DIAGNOSIS — L98422 Non-pressure chronic ulcer of back with fat layer exposed: Secondary | ICD-10-CM

## 2022-01-13 NOTE — Telephone Encounter (Signed)
Copied from [place]. Topic: Referral - Request for Referral >> Jan 13, 2022 11:45 AM Tessa Lerner A wrote: Has patient seen PCP for this complaint? Yes.   *If NO, is insurance requiring patient see PCP for this issue before PCP can refer them? Referral for which specialty: Wound Care  Preferred provider/office: Patient has no preference  Reason for referral: sacrum wound

## 2022-01-26 ENCOUNTER — Ambulatory Visit (HOSPITAL_COMMUNITY): Payer: Self-pay | Admitting: Licensed Clinical Social Worker

## 2022-01-27 ENCOUNTER — Encounter: Payer: Medicare Other | Attending: Physician Assistant | Admitting: Physician Assistant

## 2022-01-27 DIAGNOSIS — L89153 Pressure ulcer of sacral region, stage 3: Secondary | ICD-10-CM | POA: Insufficient documentation

## 2022-01-27 DIAGNOSIS — R001 Bradycardia, unspecified: Secondary | ICD-10-CM | POA: Diagnosis not present

## 2022-01-27 DIAGNOSIS — G4730 Sleep apnea, unspecified: Secondary | ICD-10-CM | POA: Diagnosis not present

## 2022-01-27 NOTE — Progress Notes (Signed)
ZOUA, CAPORASO ([id number]) Visit Report for 01/27/2022 Abuse Risk Screen Details Patient Name: [name] Robinson, [name] Robinson Date of Service: 01/27/2022 10:00 AM Medical Record Number: [id number] Patient Account Number: [id number] Date of Birth/Sex: 01/13/1951 (71 y.o. F) Treating RN: Levora Dredge Primary Care Damara Klunder: Marnee Guarneri Other Clinician: Referring Latrece Nitta: Marnee Guarneri Treating Quyen Cutsforth/Extender: Skipper Cliche in Treatment: 0 Abuse Risk Screen Items Answer ABUSE RISK SCREEN: Has anyone close to you tried to hurt or harm you recentlyo No Do you feel uncomfortable with anyone in your familyo No Has anyone forced you do things that you didnot want to doo No Electronic Signature(s) Signed: 01/27/2022 2:49:43 PM By: Levora Dredge Entered By: Levora Dredge on 01/27/2022 10:15:46 [name] Robinson ([id number]) -------------------------------------------------------------------------------- Activities of Daily Living Details Patient Name: [name] Robinson Date of Service: 01/27/2022 10:00 AM Medical Record Number: [id number] Patient Account Number: [id number] Date of Birth/Sex: [birth year] (71 y.o. F) Treating RN: Levora Dredge Primary Care Myrna Vonseggern: Marnee Guarneri Other Clinician: Referring Segundo Makela: Marnee Guarneri Treating Shiara Mcgough/Extender: Skipper Cliche in Treatment: 0 Activities of Daily Living Items Answer Activities of Daily Living (Please select one for each item) Drive Automobile Completely Able Take Medications Completely Able Use Telephone Completely Able Care for Appearance Completely Able Use Toilet Completely Able Bath / Shower Completely Able Dress Self Completely Able Feed Self Completely Able Walk Completely Able Get In / Out Bed Completely Able Housework Completely Able Prepare Meals Completely Able Handle Money Completely Able Shop for Self Completely Able Electronic Signature(s) Signed: 01/27/2022 2:49:43 PM By: Levora Dredge Entered By:  Levora Dredge on 01/27/2022 10:16:10 [name] Robinson ([id number]) -------------------------------------------------------------------------------- Education Screening Details Patient Name: [name] Robinson Date of Service: 01/27/2022 10:00 AM Medical Record Number: [id number] Patient Account Number: [id number] Date of Birth/Sex: 1950-12-02 (71 y.o. F) Treating RN: Levora Dredge Primary Care Leighton Brickley: Marnee Guarneri Other Clinician: Referring Terrion Poblano: Marnee Guarneri Treating Karlisa Gaubert/Extender: Skipper Cliche in Treatment: 0 Learning Preferences/Education Level/Primary Language Learning Preference: Explanation, Demonstration, Video, Communication Board, Printed Material Preferred Language: English Cognitive Barrier Language Barrier: No Translator Needed: No Memory Deficit: No Emotional Barrier: No Cultural/Religious Beliefs Affecting Medical Care: No Physical Barrier Impaired Vision: Yes Glasses Impaired Hearing: No Decreased Hand dexterity: No Knowledge/Comprehension Knowledge Level: High Comprehension Level: High Ability to understand written instructions: High Ability to understand verbal instructions: High Motivation Anxiety Level: Calm Cooperation: Cooperative Education Importance: Acknowledges Need Interest in Health Problems: Asks Questions Perception: Coherent Willingness to Engage in Self-Management High Activities: Readiness to Engage in Self-Management High Activities: Electronic Signature(s) Signed: 01/27/2022 2:49:43 PM By: Levora Dredge Entered By: Levora Dredge on 01/27/2022 10:16:28 [name] Robinson ([id number]) -------------------------------------------------------------------------------- Fall Risk Assessment Details Patient Name: [name] Robinson Date of Service: 01/27/2022 10:00 AM Medical Record Number: [id number] Patient Account Number: [id number] Date of Birth/Sex: 1951-02-22 (71 y.o. F) Treating RN: Levora Dredge Primary Care Ameliarose Shark:  Marnee Guarneri Other Clinician: Referring Jemeka Wagler: Marnee Guarneri Treating Lariza Cothron/Extender: Skipper Cliche in Treatment: 0 Fall Risk Assessment Items Have you had 2 or more falls in the last 12 monthso 0 No Have you had any fall that resulted in injury in the last 12 monthso 0 No FALLS RISK SCREEN History of falling - immediate or within 3 months 0 No Secondary diagnosis (Do you have 2 or more medical diagnoseso) 0 No Ambulatory aid None/bed rest/wheelchair/nurse 0 Yes Crutches/cane/walker 0 No Furniture 0 No Intravenous therapy Access/Saline/Heparin Lock 0 No Gait/Transferring Normal/ bed rest/ wheelchair 0 Yes Weak (short steps with or without shuffle, stooped but able to lift head while walking, may 0 No  seek support from furniture) Impaired (short steps with shuffle, may have difficulty arising from chair, head down, impaired 0 No balance) Mental Status Oriented to own ability 0 Yes Electronic Signature(s) Signed: 01/27/2022 2:49:43 PM By: Levora Dredge Entered By: Levora Dredge on 01/27/2022 10:16:38 [name] Robinson ([id number]) -------------------------------------------------------------------------------- Foot Assessment Details Patient Name: [name] Robinson Date of Service: 01/27/2022 10:00 AM Medical Record Number: [id number] Patient Account Number: [id number] Date of Birth/Sex: 06/10/1951 (71 y.o. F) Treating RN: Levora Dredge Primary Care Bishop Vanderwerf: Marnee Guarneri Other Clinician: Referring Maleta Pacha: Marnee Guarneri Treating Yazmine Sorey/Extender: Skipper Cliche in Treatment: 0 Foot Assessment Items Site Locations + = Sensation present, - = Sensation absent, C = Callus, U = Ulcer R = Redness, W = Warmth, M = Maceration, PU = Pre-ulcerative lesion F = Fissure, S = Swelling, D = Dryness Assessment Right: Left: Other Deformity: No No Prior Foot Ulcer: No No Prior Amputation: No No Charcot Joint: No No Ambulatory Status: Gait: Notes not completed  wound on sacrum Electronic Signature(s) Signed: 01/27/2022 2:49:43 PM By: Levora Dredge Entered By: Levora Dredge on 01/27/2022 10:17:04 [name] Robinson ([id number]) -------------------------------------------------------------------------------- Nutrition Risk Screening Details Patient Name: [name] Robinson Date of Service: 01/27/2022 10:00 AM Medical Record Number: [id number] Patient Account Number: [id number] Date of Birth/Sex: Sep 26, 1950 (71 y.o. F) Treating RN: Levora Dredge Primary Care Dyer Klug: Marnee Guarneri Other Clinician: Referring Ninoshka Wainwright: Marnee Guarneri Treating Amyri Frenz/Extender: Skipper Cliche in Treatment: 0 Height (in): 67 Weight (lbs): 124 Body Mass Index (BMI): 19.4 Nutrition Risk Screening Items Score Screening NUTRITION RISK SCREEN: I have an illness or condition that made me change the kind and/or amount of food I eat 0 No I eat fewer than two meals per day 0 No I eat few fruits and vegetables, or milk products 0 No I have three or more drinks of beer, liquor or wine almost every day 0 No I have tooth or mouth problems that make it hard for me to eat 0 No I don't always have enough money to buy the food I need 0 No I eat alone most of the time 0 No I take three or more different prescribed or over-the-counter drugs a day 0 No Without wanting to, I have lost or gained 10 pounds in the last six months 0 No I am not always physically able to shop, cook and/or feed myself 0 No Nutrition Protocols Good Risk Protocol 0 No interventions needed Moderate Risk Protocol High Risk Proctocol Risk Level: Good Risk Score: 0 Electronic Signature(s) Signed: 01/27/2022 2:49:43 PM By: Levora Dredge Entered By: Levora Dredge on 01/27/2022 10:16:46

## 2022-01-27 NOTE — Progress Notes (Signed)
[name] Robinson ([id number]) Visit Report for 01/27/2022 Allergy List Details Patient Name: [name] Robinson, [name] Robinson Date of Service: 01/27/2022 10:00 AM Medical Record Number: [id number] Patient Account Number: [id number] Date of Birth/Sex: [birth year] (71 y.o. F) Treating RN: Levora Dredge Primary Care Raniyah Curenton: Marnee Guarneri Other Clinician: Referring Zanylah Hardie: Marnee Guarneri Treating Nekesha Font/Extender: Jeri Cos Weeks in Treatment: 0 Allergies Active Allergies solifenacin Reaction: rash Allergy Notes Electronic Signature(s) Signed: 01/27/2022 2:49:43 PM By: Levora Dredge Entered By: Levora Dredge on 01/27/2022 10:09:37 [name] Robinson ([id number]) -------------------------------------------------------------------------------- Arrival Information Details Patient Name: [name] Robinson Date of Service: 01/27/2022 10:00 AM Medical Record Number: [id number] Patient Account Number: [id number] Date of Birth/Sex: [birth year] (71 y.o. F) Treating RN: Levora Dredge Primary Care Pershing Skidmore: Marnee Guarneri Other Clinician: Referring Council Munguia: Marnee Guarneri Treating Skylor Schnapp/Extender: Skipper Cliche in Treatment: 0 Visit Information Patient Arrived: Walker Arrival Time: 10:02 Accompanied By: self Transfer Assistance: None Patient Identification Verified: Yes Secondary Verification Process Completed: Yes Electronic Signature(s) Signed: 01/27/2022 2:49:43 PM By: Levora Dredge Entered By: Levora Dredge on 01/27/2022 10:08:07 [name] Robinson ([id number]) -------------------------------------------------------------------------------- Clinic Level of Care Assessment Details Patient Name: [name] Robinson Date of Service: 01/27/2022 10:00 AM Medical Record Number: [id number] Patient Account Number: [id number] Date of Birth/Sex: [birth year] (71 y.o. F) Treating RN: Levora Dredge Primary Care Sevanna Ballengee: Marnee Guarneri Other Clinician: Referring Mulki Roesler: Marnee Guarneri Treating  Leanard Dimaio/Extender: Skipper Cliche in Treatment: 0 Clinic Level of Care Assessment Items TOOL 1 Quantity Score '[]'$  - Use when EandM and Procedure is performed on INITIAL visit 0 ASSESSMENTS - Nursing Assessment / Reassessment X - General Physical Exam (combine w/ comprehensive assessment (listed just below) when performed on new 1 20 pt. evals) X- 1 25 Comprehensive Assessment (HX, ROS, Risk Assessments, Wounds Hx, etc.) ASSESSMENTS - Wound and Skin Assessment / Reassessment '[]'$  - Dermatologic / Skin Assessment (not related to wound area) 0 ASSESSMENTS - Ostomy and/or Continence Assessment and Care '[]'$  - Incontinence Assessment and Management 0 '[]'$  - 0 Ostomy Care Assessment and Management (repouching, etc.) PROCESS - Coordination of Care X - Simple Patient / Family Education for ongoing care 1 15 '[]'$  - 0 Complex (extensive) Patient / Family Education for ongoing care X- 1 10 Staff obtains Consents, Records, Test Results / Process Orders '[]'$  - 0 Staff telephones HHA, Nursing Homes / Clarify orders / etc '[]'$  - 0 Routine Transfer to another Facility (non-emergent condition) '[]'$  - 0 Routine Hospital Admission (non-emergent condition) X- 1 15 New Admissions / Biomedical engineer / Ordering NPWT, Apligraf, etc. '[]'$  - 0 Emergency Hospital Admission (emergent condition) PROCESS - Special Needs '[]'$  - Pediatric / Minor Patient Management 0 '[]'$  - 0 Isolation Patient Management '[]'$  - 0 Hearing / Language / Visual special needs '[]'$  - 0 Assessment of Community assistance (transportation, D/C planning, etc.) '[]'$  - 0 Additional assistance / Altered mentation '[]'$  - 0 Support Surface(s) Assessment (bed, cushion, seat, etc.) INTERVENTIONS - Miscellaneous '[]'$  - External ear exam 0 '[]'$  - 0 Patient Transfer (multiple staff / Civil Service fast streamer / Similar devices) '[]'$  - 0 Simple Staple / Suture removal (25 or less) '[]'$  - 0 Complex Staple / Suture removal (26 or more) '[]'$  - 0 Hypo/Hyperglycemic Management  (do not check if billed separately) '[]'$  - 0 Ankle / Brachial Index (ABI) - do not check if billed separately Has the patient been seen at the hospital within the last three years: Yes Total Score: 85 Level Of Care: New/Established - Level 3 AMIAYAH, GIEBEL ([id number]) Electronic Signature(s) Signed: 01/27/2022 2:49:43 PM By: Levora Dredge Entered By:  Levora Dredge on 01/27/2022 11:18:26 SANDARA, TYREE ([id number]) -------------------------------------------------------------------------------- Encounter Discharge Information Details Patient Name: [name] Robinson Date of Service: 01/27/2022 10:00 AM Medical Record Number: [id number] Patient Account Number: [id number] Date of Birth/Sex: Aug 26, 1950 (71 y.o. F) Treating RN: Levora Dredge Primary Care Sloan Galentine: Marnee Guarneri Other Clinician: Referring Kemora Pinard: Marnee Guarneri Treating Sandeep Radell/Extender: Skipper Cliche in Treatment: 0 Encounter Discharge Information Items Post Procedure Vitals Discharge Condition: Stable Temperature (F): 97.6 Ambulatory Status: Walker Pulse (bpm): 79 Discharge Destination: Home Respiratory Rate (breaths/min): 18 Transportation: Private Auto Blood Pressure (mmHg): 113/72 Accompanied By: husband Schedule Follow-up Appointment: Yes Clinical Summary of Care: Electronic Signature(s) Signed: 01/27/2022 11:19:54 AM By: Levora Dredge Entered By: Levora Dredge on 01/27/2022 11:19:54 [name] Robinson ([id number]) -------------------------------------------------------------------------------- Lower Extremity Assessment Details Patient Name: [name] Robinson Date of Service: 01/27/2022 10:00 AM Medical Record Number: [id number] Patient Account Number: [id number] Date of Birth/Sex: 15-Jan-1951 (71 y.o. F) Treating RN: Levora Dredge Primary Care Milagro Belmares: Marnee Guarneri Other Clinician: Referring Gable Odonohue: Marnee Guarneri Treating Ladonna Vanorder/Extender: Skipper Cliche in Treatment: 0 Electronic  Signature(s) Signed: 01/27/2022 2:49:43 PM By: Levora Dredge Entered By: Levora Dredge on 01/27/2022 10:17:17 [name] Robinson ([id number]) -------------------------------------------------------------------------------- Multi Wound Chart Details Patient Name: [name] Robinson Date of Service: 01/27/2022 10:00 AM Medical Record Number: [id number] Patient Account Number: [id number] Date of Birth/Sex: 1951-08-06 (71 y.o. F) Treating RN: Levora Dredge Primary Care Paizley Ramella: Marnee Guarneri Other Clinician: Referring Gianny Sabino: Marnee Guarneri Treating Reverie Vaquera/Extender: Skipper Cliche in Treatment: 0 Vital Signs Height(in): 67 Pulse(bpm): 79 Weight(lbs): 124 Blood Pressure(mmHg): 113/72 Body Mass Index(BMI): 19.4 Temperature(F): 97.6 Respiratory Rate(breaths/min): 18 Photos: [N/A:N/A] Wound Location: Medial Sacrum N/A N/A Wounding Event: Pressure Injury N/A N/A Primary Etiology: Pressure Ulcer N/A N/A Comorbid History: Anemia, Sleep Apnea, Arrhythmia, N/A N/A History of pressure wounds Date Acquired: 07/05/2021 N/A N/A Weeks of Treatment: 0 N/A N/A Wound Status: Open N/A N/A Wound Recurrence: No N/A N/A Measurements L x W x D (cm) 1x0.7x0.1 N/A N/A Area (cm) : 0.55 N/A N/A Volume (cm) : 0.055 N/A N/A Classification: Category/Stage II N/A N/A Exudate Amount: Medium N/A N/A Exudate Type: Serosanguineous N/A N/A Exudate Color: red, brown N/A N/A Granulation Amount: Large (67-100%) N/A N/A Granulation Quality: Pink N/A N/A Necrotic Amount: None Present (0%) N/A N/A Exposed Structures: Fat Layer (Subcutaneous Tissue): N/A N/A Yes Epithelialization: Medium (34-66%) N/A N/A Treatment Notes Electronic Signature(s) Signed: 01/27/2022 10:42:09 AM By: Levora Dredge Entered By: Levora Dredge on 01/27/2022 10:42:09 [name] Robinson ([id number]) -------------------------------------------------------------------------------- Multi-Disciplinary Care Plan Details Patient  Name: [name] Robinson Date of Service: 01/27/2022 10:00 AM Medical Record Number: [id number] Patient Account Number: [id number] Date of Birth/Sex: 1951-08-07 (71 y.o. F) Treating RN: Levora Dredge Primary Care Joby Richart: Marnee Guarneri Other Clinician: Referring Mickey Esguerra: Marnee Guarneri Treating Adriella Essex/Extender: Skipper Cliche in Treatment: 0 Active Inactive Orientation to the Wound Care Program Nursing Diagnoses: Knowledge deficit related to the wound healing center program Goals: Patient/caregiver will verbalize understanding of the [place] Program Date Initiated: 01/27/2022 Target Resolution Date: 02/03/2022 Goal Status: Active Interventions: Provide education on orientation to the wound center Notes: Pressure Nursing Diagnoses: Knowledge deficit related to causes and risk factors for pressure ulcer development Knowledge deficit related to management of pressures ulcers Potential for impaired tissue integrity related to pressure, friction, moisture, and shear Goals: Patient will remain free from development of additional pressure ulcers Date Initiated: 01/27/2022 Target Resolution Date: 02/24/2022 Goal Status: Active Patient will remain free of pressure ulcers Date Initiated: 01/27/2022 Target Resolution Date: 02/24/2022 Goal Status: Active Patient/caregiver will verbalize risk  factors for pressure ulcer development Date Initiated: 01/27/2022 Target Resolution Date: 02/03/2022 Goal Status: Active Patient/caregiver will verbalize understanding of pressure ulcer management Date Initiated: 01/27/2022 Target Resolution Date: 02/03/2022 Goal Status: Active Interventions: Assess: immobility, friction, shearing, incontinence upon admission and as needed Assess offloading mechanisms upon admission and as needed Assess potential for pressure ulcer upon admission and as needed Provide education on pressure ulcers Notes: Wound/Skin Impairment Nursing  Diagnoses: Impaired tissue integrity Knowledge deficit related to ulceration/compromised skin integrity Goals: Ulcer/skin breakdown will have a volume reduction of 30% by week 4 Date Initiated: 01/27/2022 Target Resolution Date: 02/24/2022 RONESHIA, DREW ([id number]) Goal Status: Active Ulcer/skin breakdown will have a volume reduction of 50% by week 8 Date Initiated: 01/27/2022 Target Resolution Date: 03/24/2022 Goal Status: Active Ulcer/skin breakdown will have a volume reduction of 80% by week 12 Date Initiated: 01/27/2022 Target Resolution Date: 04/21/2022 Goal Status: Active Ulcer/skin breakdown will heal within 14 weeks Date Initiated: 01/27/2022 Target Resolution Date: 05/05/2022 Goal Status: Active Interventions: Assess patient/caregiver ability to obtain necessary supplies Assess patient/caregiver ability to perform ulcer/skin care regimen upon admission and as needed Assess ulceration(s) every visit Provide education on ulcer and skin care Notes: Electronic Signature(s) Signed: 01/27/2022 10:41:53 AM By: Levora Dredge Entered By: Levora Dredge on 01/27/2022 10:41:52 [name] Robinson ([id number]) -------------------------------------------------------------------------------- Pain Assessment Details Patient Name: [name] Robinson Date of Service: 01/27/2022 10:00 AM Medical Record Number: [id number] Patient Account Number: [id number] Date of Birth/Sex: [birth year] (71 y.o. F) Treating RN: Levora Dredge Primary Care Tennyson Kallen: Marnee Guarneri Other Clinician: Referring Lloyd Cullinan: Marnee Guarneri Treating Teran Daughenbaugh/Extender: Skipper Cliche in Treatment: 0 Active Problems Location of Pain Severity and Description of Pain Patient Has Paino No Site Locations Rate the pain. Current Pain Level: 0 Pain Management and Medication Current Pain Management: Electronic Signature(s) Signed: 01/27/2022 2:49:43 PM By: Levora Dredge Entered By: Levora Dredge on 01/27/2022  10:08:31 [name] Robinson ([id number]) -------------------------------------------------------------------------------- Patient/Caregiver Education Details Patient Name: [name] Robinson Date of Service: 01/27/2022 10:00 AM Medical Record Number: [id number] Patient Account Number: [id number] Date of Birth/Gender: 1950-09-18 (71 y.o. F) Treating RN: Levora Dredge Primary Care Physician: Marnee Guarneri Other Clinician: Referring Physician: Marnee Guarneri Treating Physician/Extender: Skipper Cliche in Treatment: 0 Education Assessment Education Provided To: Patient Education Topics Provided Pressure: Handouts: Pressure Ulcers: Care and Offloading Methods: Explain/Verbal Responses: State content correctly Welcome To [facility] [place]: Handouts: Welcome To [facility] [place] Methods: Explain/Verbal Responses: State content correctly Wound Debridement: Handouts: Wound Debridement Methods: Explain/Verbal Responses: State content correctly Wound/Skin Impairment: Handouts: Caring for Your Ulcer Methods: Explain/Verbal Responses: State content correctly Electronic Signature(s) Signed: 01/27/2022 2:49:43 PM By: Levora Dredge Entered By: Levora Dredge on 01/27/2022 11:18:54 [name] Robinson ([id number]) -------------------------------------------------------------------------------- Wound Assessment Details Patient Name: [name] Robinson Date of Service: 01/27/2022 10:00 AM Medical Record Number: [id number] Patient Account Number: [id number] Date of Birth/Sex: 1951-06-30 (71 y.o. F) Treating RN: Levora Dredge Primary Care Cresta Riden: Marnee Guarneri Other Clinician: Referring Porcia Morganti: Marnee Guarneri Treating Airyanna Dipalma/Extender: Skipper Cliche in Treatment: 0 Wound Status Wound Number: 1 Primary Pressure Ulcer Etiology: Wound Location: Medial Sacrum Wound Status: Open Wounding Event: Pressure Injury Comorbid Anemia, Sleep Apnea, Arrhythmia, History of pressure Date  Acquired: 07/05/2021 History: wounds Weeks Of Treatment: 0 Clustered Wound: No Photos Wound Measurements Length: (cm) 1 Width: (cm) 0.7 Depth: (cm) 0.1 Area: (cm) 0.55 Volume: (cm) 0.055 % Reduction in Area: 0% % Reduction in Volume: 0% Epithelialization: Medium (34-66%) Tunneling: No Undermining: No Wound Description Classification: Category/Stage III Exudate Amount: Medium Exudate Type: Serosanguineous Exudate  Color: red, brown Foul Odor After Cleansing: No Slough/Fibrino No Wound Bed Granulation Amount: Large (67-100%) Exposed Structure Granulation Quality: Pink Fat Layer (Subcutaneous Tissue) Exposed: Yes Necrotic Amount: None Present (0%) Treatment Notes Wound #1 (Sacrum) Wound Laterality: Medial Cleanser Soap and Water Discharge Instruction: Gently cleanse wound with antibacterial soap, rinse and pat dry prior to dressing wounds Peri-Wound Care Topical Primary Dressing Hydrofera Blue Ready Transfer Foam, 4x5 (in/in) DOMINIGUE, GELLNER ([id number]) Discharge Instruction: Apply Hydrofera Blue Ready to wound bed as directed Secondary Dressing (SILICONE BORDER) Zetuvit Plus SILICONE BORDER Dressing 4x4 (in/in) Discharge Instruction: Please do not put silicone bordered dressings under wraps. Use non-bordered dressing only. Secured With Compression Wrap Compression Stockings Add-Ons Electronic Signature(s) Signed: 01/27/2022 10:50:23 AM By: Worthy Keeler PA-C Signed: 01/27/2022 2:49:43 PM By: Levora Dredge Entered By: Worthy Keeler on 01/27/2022 10:50:23 [name] Robinson ([id number]) -------------------------------------------------------------------------------- Vitals Details Patient Name: [name] Robinson Date of Service: 01/27/2022 10:00 AM Medical Record Number: [id number] Patient Account Number: [id number] Date of Birth/Sex: 1951-03-13 (71 y.o. F) Treating RN: Levora Dredge Primary Care Burech Mcfarland: Marnee Guarneri Other Clinician: Referring Hillary Schwegler:  Marnee Guarneri Treating Desere Gwin/Extender: Skipper Cliche in Treatment: 0 Vital Signs Time Taken: 10:08 Temperature (F): 97.6 Height (in): 67 Pulse (bpm): 79 Source: Stated Respiratory Rate (breaths/min): 18 Weight (lbs): 124 Blood Pressure (mmHg): 113/72 Source: Stated Reference Range: 80 - 120 mg / dl Body Mass Index (BMI): 19.4 Electronic Signature(s) Signed: 01/27/2022 2:49:43 PM By: Levora Dredge Entered By: Levora Dredge on 01/27/2022 10:09:11

## 2022-01-27 NOTE — Progress Notes (Addendum)
[name], Robinson ([id number]) Visit Report for 01/27/2022 Chief Complaint Document Details Patient Name: [name] Robinson, [name] Robinson Date of Service: 01/27/2022 10:00 AM Medical Record Number: [id number] Patient Account Number: [id number] Date of Birth/Sex: Dec 04, 1950 (71 y.o. F) Treating RN: Primary Care Provider: Marnee Guarneri Other Clinician: Referring Provider: Marnee Guarneri Treating Provider/Extender: Skipper Cliche in Treatment: 0 Information Obtained from: Patient Chief Complaint Pressure ulcer sacral region Electronic Signature(s) Signed: 01/27/2022 10:51:38 AM By: Worthy Keeler PA-C Entered By: Worthy Keeler on 01/27/2022 10:51:38 [name] Robinson ([id number]) -------------------------------------------------------------------------------- Debridement Details Patient Name: [name] Robinson Date of Service: 01/27/2022 10:00 AM Medical Record Number: [id number] Patient Account Number: [id number] Date of Birth/Sex: [birth year] (71 y.o. F) Treating RN: Levora Dredge Primary Care Provider: Marnee Guarneri Other Clinician: Referring Provider: Marnee Guarneri Treating Provider/Extender: Skipper Cliche in Treatment: 0 Debridement Performed for Wound #1 Medial Sacrum Assessment: Performed By: Physician Tommie Sams., PA-C Debridement Type: Debridement Level of Consciousness (Pre- Awake and Alert procedure): Pre-procedure Verification/Time Out Yes - 10:58 Taken: Total Area Debrided (L x W): 1 (cm) x 0.7 (cm) = 0.7 (cm) Tissue and other material Viable, Non-Viable, Subcutaneous debrided: Level: Skin/Subcutaneous Tissue Debridement Description: Excisional Instrument: Curette Bleeding: Moderate Hemostasis Achieved: Silver Nitrate Response to Treatment: Procedure was tolerated well Level of Consciousness (Post- Awake and Alert procedure): Post Debridement Measurements of Total Wound Length: (cm) 1 Stage: Category/Stage III Width: (cm) 0.7 Depth: (cm) 0.1 Volume: (cm)  0.055 Character of Wound/Ulcer Post Debridement: Stable Post Procedure Diagnosis Same as Pre-procedure Notes 1 stick silver nitrate used Electronic Signature(s) Signed: 01/27/2022 2:49:43 PM By: Levora Dredge Signed: 01/27/2022 3:32:08 PM By: Worthy Keeler PA-C Entered By: Levora Dredge on 01/27/2022 10:59:52 [name] Robinson ([id number]) -------------------------------------------------------------------------------- HPI Details Patient Name: [name] Robinson Date of Service: 01/27/2022 10:00 AM Medical Record Number: [id number] Patient Account Number: [id number] Date of Birth/Sex: [birth year] (71 y.o. F) Treating RN: Primary Care Provider: Marnee Guarneri Other Clinician: Referring Provider: Marnee Guarneri Treating Provider/Extender: Skipper Cliche in Treatment: 0 History of Present Illness HPI Description: 01-27-2022 upon evaluation today patient appears for initial evaluation here in our clinic concerning issues she is having with a pressure area over the sacral region. Its a question of whether this is a stage III or stage IV pressure ulcer. The patient believes that it did go down to bone documentation in the paperwork says fat layer either way this is definitely at least a 3. I do see signs of actually good healing compared to the size it was in the past and the patient concurs with this. With that being said she was in septic shock and not turned significantly to offload appropriately as what the patient tells me occurred which led to the pressure ulcer. Subsequently she had a wound VAC on initially and this was discontinued somewhere around mid May by 01-11-2022 the note we have for review states the Silvio Pate is been off for 2 weeks and the wound is improving. Patient does have a history of bradycardia as well as sleep apnea but no other major medical problems. Electronic Signature(s) Signed: 01/27/2022 12:04:52 PM By: Worthy Keeler PA-C Entered By: Worthy Keeler on  01/27/2022 12:04:52 [name] Robinson ([id number]) -------------------------------------------------------------------------------- Physical Exam Details Patient Name: [name] Robinson Date of Service: 01/27/2022 10:00 AM Medical Record Number: [id number] Patient Account Number: [id number] Date of Birth/Sex: [birth year] (71 y.o. F) Treating RN: Primary Care Provider: Marnee Guarneri Other Clinician: Referring Provider: Marnee Guarneri Treating Provider/Extender: Skipper Cliche in Treatment: 0 Constitutional sitting or standing blood  pressure is within target range for patient.. pulse regular and within target range for patient.Marland Kitchen respirations regular, non- labored and within target range for patient.Marland Kitchen temperature within target range for patient.. Thin and well-hydrated in no acute distress. Eyes conjunctiva clear no eyelid edema noted. pupils equal round and reactive to light and accommodation. Ears, Nose, Mouth, and Throat no gross abnormality of ear auricles or external auditory canals. normal hearing noted during conversation. mucus membranes moist. Respiratory normal breathing without difficulty. Musculoskeletal normal gait and posture. no significant deformity or arthritic changes, no loss or range of motion, no clubbing. Psychiatric this patient is able to make decisions and demonstrates good insight into disease process. Alert and Oriented x 3. pleasant and cooperative. Notes Upon inspection patient's wound bed actually showed signs of good granulation and epithelization at this point. Fortunately I do not see any signs of infection though she does have some hypergranulation. I am going to have to clean away some of this tissue that is overgrown and not healthy. She was in agreement with that plan I did perform debridement clear this away and postdebridement used silver nitrate to chemically cauterize the area to keep it from overgrown and hypergranulating yet again. Overall the patient  seems to be doing extremely well postdebridement which is great news. Electronic Signature(s) Signed: 01/27/2022 12:06:02 PM By: Worthy Keeler PA-C Entered By: Worthy Keeler on 01/27/2022 12:06:01 [name] Robinson ([id number]) -------------------------------------------------------------------------------- Physician Orders Details Patient Name: [name] Robinson Date of Service: 01/27/2022 10:00 AM Medical Record Number: [id number] Patient Account Number: [id number] Date of Birth/Sex: 01/07/1951 (71 y.o. F) Treating RN: Levora Dredge Primary Care Provider: Marnee Guarneri Other Clinician: Referring Provider: Marnee Guarneri Treating Provider/Extender: Skipper Cliche in Treatment: 0 Verbal / Phone Orders: No Diagnosis Coding ICD-10 Coding Code Description L89.153 Pressure ulcer of sacral region, stage 3 R00.1 Bradycardia, unspecified G47.30 Sleep apnea, unspecified Follow-up Appointments o Return Appointment in 2 weeks. o Nurse Visit as needed [place]: - [place] for wound care. May utilize formulary equivalent dressing for wound treatment orders unless otherwise specified. Home Health Nurse may visit PRN to address patientos wound care needs. o Scheduled days for dressing changes to be completed; exception, patient has scheduled wound care visit that day. o **Please direct any NON-WOUND related issues/requests for orders to patient's Primary Care Physician. **If current dressing causes regression in wound condition, may D/C ordered dressing product/s and apply Normal Saline Moist Dressing daily until next [place] or Other MD appointment. **Notify [facility] of regression in wound condition at [phone]. Bathing/ Shower/ Hygiene o May shower; gently cleanse wound with antibacterial soap, rinse and pat dry prior to dressing wounds o No tub bath. o Other: - if swimming, please cover wound  with a tegaderm and change after exiting pool, was with recommended dial soap and replace with ordered dressing. Wound Treatment Wound #1 - Sacrum Wound Laterality: Medial Cleanser: Soap and Water 3 x Per Week/30 Days Discharge Instructions: Gently cleanse wound with antibacterial soap, rinse and pat dry prior to dressing wounds Primary Dressing: Hydrofera Blue Ready Transfer Foam, 4x5 (in/in) 3 x Per Week/30 Days Discharge Instructions: Apply Hydrofera Blue Ready to wound bed as directed Secondary Dressing: (SILICONE BORDER) Zetuvit Plus SILICONE BORDER Dressing 4x4 (in/in) 3 x Per Week/30 Days Discharge Instructions: Please do not put silicone bordered dressings under wraps. Use non-bordered dressing only. Electronic Signature(s) Signed: 01/27/2022 2:49:43 PM By: Levora Dredge Signed: 01/27/2022 3:32:08 PM  By: Worthy Keeler PA-C Entered By: Levora Dredge on 01/27/2022 11:03:15 [name] Robinson ([id number]) -------------------------------------------------------------------------------- Problem List Details Patient Name: [name] Robinson Date of Service: 01/27/2022 10:00 AM Medical Record Number: [id number] Patient Account Number: [id number] Date of Birth/Sex: [birth year] (71 y.o. F) Treating RN: Primary Care Provider: Marnee Guarneri Other Clinician: Referring Provider: Marnee Guarneri Treating Provider/Extender: Skipper Cliche in Treatment: 0 Active Problems ICD-10 Encounter Code Description Active Date MDM Diagnosis L89.153 Pressure ulcer of sacral region, stage 3 01/27/2022 No Yes R00.1 Bradycardia, unspecified 01/27/2022 No Yes G47.30 Sleep apnea, unspecified 01/27/2022 No Yes Inactive Problems Resolved Problems Electronic Signature(s) Signed: 01/27/2022 10:50:39 AM By: Worthy Keeler PA-C Entered By: Worthy Keeler on 01/27/2022 10:50:39 [name] Robinson ([id number]) -------------------------------------------------------------------------------- Progress Note  Details Patient Name: [name] Robinson Date of Service: 01/27/2022 10:00 AM Medical Record Number: [id number] Patient Account Number: [id number] Date of Birth/Sex: 10/08/1950 (71 y.o. F) Treating RN: Primary Care Provider: Marnee Guarneri Other Clinician: Referring Provider: Marnee Guarneri Treating Provider/Extender: Skipper Cliche in Treatment: 0 Subjective Chief Complaint Information obtained from Patient Pressure ulcer sacral region History of Present Illness (HPI) 01-27-2022 upon evaluation today patient appears for initial evaluation here in our clinic concerning issues she is having with a pressure area over the sacral region. Its a question of whether this is a stage III or stage IV pressure ulcer. The patient believes that it did go down to bone documentation in the paperwork says fat layer either way this is definitely at least a 3. I do see signs of actually good healing compared to the size it was in the past and the patient concurs with this. With that being said she was in septic shock and not turned significantly to offload appropriately as what the patient tells me occurred which led to the pressure ulcer. Subsequently she had a wound VAC on initially and this was discontinued somewhere around mid May by 01-11-2022 the note we have for review states the Wound VAC has been off for 2 weeks and the wound is improving. Patient does have a history of bradycardia as well as sleep apnea but no other major medical problems. Patient History Information obtained from Patient. Allergies solifenacin (Reaction: rash) Social History Never smoker, Alcohol Use - Never, Drug Use - No History, Caffeine Use - Never. Medical History Hematologic/Lymphatic Patient has history of Anemia Respiratory Patient has history of Sleep Apnea - not currently using CPAP Cardiovascular Patient has history of Arrhythmia - brady Integumentary (Skin) Patient has history of History of pressure  wounds Review of Systems (ROS) Constitutional Symptoms (General Health) Denies complaints or symptoms of Fatigue, Fever, Chills, Marked Weight Change. Eyes Complains or has symptoms of Glasses / Contacts. Ear/Nose/Mouth/Throat Denies complaints or symptoms of Difficult clearing ears, Sinusitis. Gastrointestinal Denies complaints or symptoms of Frequent diarrhea, Nausea, Vomiting. Endocrine Complains or has symptoms of Thyroid disease - hashimotos. Genitourinary Denies complaints or symptoms of Kidney failure/ Dialysis, Incontinence/dribbling. Immunological Denies complaints or symptoms of Hives, Itching. Musculoskeletal Denies complaints or symptoms of Muscle Pain, Muscle Weakness. Neurologic Denies complaints or symptoms of Numbness/parasthesias, Focal/Weakness. Psychiatric Complains or has symptoms of Anxiety. ASHYLA, LUTH ([id number]) Objective Constitutional sitting or standing blood pressure is within target range for patient.. pulse regular and within target range for patient.Marland Kitchen respirations regular, non- labored and within target range for patient.Marland Kitchen temperature within target range for patient.. Thin and well-hydrated in no acute distress. Vitals Time Taken: 10:08 AM, Height: 67 in, Source: Stated, Weight: 124 lbs, Source: Stated, BMI: 19.4, Temperature:  97.6 F, Pulse: 79 bpm, Respiratory Rate: 18 breaths/min, Blood Pressure: 113/72 mmHg. Eyes conjunctiva clear no eyelid edema noted. pupils equal round and reactive to light and accommodation. Ears, Nose, Mouth, and Throat no gross abnormality of ear auricles or external auditory canals. normal hearing noted during conversation. mucus membranes moist. Respiratory normal breathing without difficulty. Musculoskeletal normal gait and posture. no significant deformity or arthritic changes, no loss or range of motion, no clubbing. Psychiatric this patient is able to make decisions and demonstrates good insight into disease  process. Alert and Oriented x 3. pleasant and cooperative. General Notes: Upon inspection patient's wound bed actually showed signs of good granulation and epithelization at this point. Fortunately I do not see any signs of infection though she does have some hypergranulation. I am going to have to clean away some of this tissue that is overgrown and not healthy. She was in agreement with that plan I did perform debridement clear this away and postdebridement used silver nitrate to chemically cauterize the area to keep it from overgrown and hypergranulating yet again. Overall the patient seems to be doing extremely well postdebridement which is great news. Integumentary (Hair, Skin) Wound #1 status is Open. Original cause of wound was Pressure Injury. The date acquired was: 07/05/2021. The wound is located on the Medial Sacrum. The wound measures 1cm length x 0.7cm width x 0.1cm depth; 0.55cm^2 area and 0.055cm^3 volume. There is Fat Layer (Subcutaneous Tissue) exposed. There is no tunneling or undermining noted. There is a medium amount of serosanguineous drainage noted. There is large (67-100%) pink granulation within the wound bed. There is no necrotic tissue within the wound bed. Assessment Active Problems ICD-10 Pressure ulcer of sacral region, stage 3 Bradycardia, unspecified Sleep apnea, unspecified Procedures Wound #1 Pre-procedure diagnosis of Wound #1 is a Pressure Ulcer located on the Medial Sacrum . There was a Excisional Skin/Subcutaneous Tissue Debridement with a total area of 0.7 sq cm performed by Tommie Sams., PA-C. With the following instrument(s): Curette to remove Viable and Non-Viable tissue/material. Material removed includes Subcutaneous Tissue. No specimens were taken. A time out was conducted at 10:58, prior to the start of the procedure. A Moderate amount of bleeding was controlled with Silver Nitrate. The procedure was tolerated well. Post Debridement Measurements:  1cm length x 0.7cm width x 0.1cm depth; 0.055cm^3 volume. Post debridement Stage noted as Category/Stage III. Character of Wound/Ulcer Post Debridement is stable. Post procedure Diagnosis Wound #1: Same as Pre-Procedure General Notes: 1 stick silver nitrate used. Plan Follow-up Appointments: LEOMIA, BLAKE ([id number]) Return Appointment in 2 weeks. Nurse Visit as needed Home Health: [place]: - [place] for wound care. May utilize formulary equivalent dressing for wound treatment orders unless otherwise specified. Home Health Nurse may visit PRN to address patient s wound care needs. Scheduled days for dressing changes to be completed; exception, patient has scheduled wound care visit that day. **Please direct any NON-WOUND related issues/requests for orders to patient's Primary Care Physician. **If current dressing causes regression in wound condition, may D/C ordered dressing product/s and apply Normal Saline Moist Dressing daily until next [place] or Other MD appointment. **Notify [facility] of regression in wound condition at [phone]. Bathing/ Shower/ Hygiene: May shower; gently cleanse wound with antibacterial soap, rinse and pat dry prior to dressing wounds No tub bath. Other: - if swimming, please cover wound with a tegaderm and change after exiting pool, was with recommended dial soap and replace with ordered  dressing. WOUND #1: - Sacrum Wound Laterality: Medial Cleanser: Soap and Water 3 x Per Week/30 Days Discharge Instructions: Gently cleanse wound with antibacterial soap, rinse and pat dry prior to dressing wounds Primary Dressing: Hydrofera Blue Ready Transfer Foam, 4x5 (in/in) 3 x Per Week/30 Days Discharge Instructions: Apply Hydrofera Blue Ready to wound bed as directed Secondary Dressing: (SILICONE BORDER) Zetuvit Plus SILICONE BORDER Dressing 4x4 (in/in) 3 x Per Week/30 Days Discharge Instructions: Please do  not put silicone bordered dressings under wraps. Use non-bordered dressing only. 1. I am can recommend that we go ahead and initiate treatment currently with [facility] this is what is being used and I think that still the right way to go. 2. We will continue with the bordered foam dressing to cover. 3. I am also can recommend the patient continue with appropriate offloading she seems to be doing extremely well currently and overall I am pleased in that regard. We will see patient back for reevaluation in 2 weeks here in the clinic. If anything worsens or changes patient will contact our office for additional recommendations. Home health will continue to take care of this in the interim. Electronic Signature(s) Signed: 02/10/2022 2:32:49 PM By: Worthy Keeler PA-C Previous Signature: 01/27/2022 12:06:43 PM Version By: Worthy Keeler PA-C Entered By: Worthy Keeler on 02/10/2022 14:32:48 [name] Robinson ([id number]) -------------------------------------------------------------------------------- ROS/PFSH Details Patient Name: [name] Robinson Date of Service: 01/27/2022 10:00 AM Medical Record Number: [id number] Patient Account Number: [id number] Date of Birth/Sex: [birth year] (71 y.o. F) Treating RN: Levora Dredge Primary Care Provider: Marnee Guarneri Other Clinician: Referring Provider: Marnee Guarneri Treating Provider/Extender: Skipper Cliche in Treatment: 0 Information Obtained From Patient Constitutional Symptoms (General Health) Complaints and Symptoms: Negative for: Fatigue; Fever; Chills; Marked Weight Change Eyes Complaints and Symptoms: Positive for: Glasses / Contacts Ear/Nose/Mouth/Throat Complaints and Symptoms: Negative for: Difficult clearing ears; Sinusitis Gastrointestinal Complaints and Symptoms: Negative for: Frequent diarrhea; Nausea; Vomiting Endocrine Complaints and Symptoms: Positive for: Thyroid disease - hashimotos Genitourinary Complaints and  Symptoms: Negative for: Kidney failure/ Dialysis; Incontinence/dribbling Immunological Complaints and Symptoms: Negative for: Hives; Itching Musculoskeletal Complaints and Symptoms: Negative for: Muscle Pain; Muscle Weakness Neurologic Complaints and Symptoms: Negative for: Numbness/parasthesias; Focal/Weakness Psychiatric Complaints and Symptoms: Positive for: Anxiety Hematologic/Lymphatic Medical History: Positive for: Anemia NIRVANA, BLANCHETT ([id number]) Respiratory Medical History: Positive for: Sleep Apnea - not currently using CPAP Cardiovascular Medical History: Positive for: Arrhythmia - brady Integumentary (Skin) Medical History: Positive for: History of pressure wounds Oncologic Immunizations Pneumococcal Vaccine: Received Pneumococcal Vaccination: No Implantable Devices None Family and Social History Never smoker; Alcohol Use: Never; Drug Use: No History; Caffeine Use: Never Electronic Signature(s) Signed: 01/27/2022 2:49:43 PM By: Levora Dredge Signed: 01/27/2022 3:32:08 PM By: Worthy Keeler PA-C Entered By: Levora Dredge on 01/27/2022 10:15:40 [name] Robinson ([id number]) -------------------------------------------------------------------------------- SuperBill Details Patient Name: [name] Robinson Date of Service: 01/27/2022 Medical Record Number: [id number] Patient Account Number: [id number] Date of Birth/Sex: 06/25/1951 (71 y.o. F) Treating RN: Primary Care Provider: Marnee Guarneri Other Clinician: Referring Provider: Marnee Guarneri Treating Provider/Extender: Skipper Cliche in Treatment: 0 Diagnosis Coding ICD-10 Codes Code Description L89.153 Pressure ulcer of sacral region, stage 3 R00.1 Bradycardia, unspecified G47.30 Sleep apnea, unspecified Facility Procedures CPT4 Code: [id number] Description: 99213 - WOUND CARE VISIT-LEV 3 EST PT Modifier: Quantity: 1 CPT4 Code: [id number] Description: 11042 - DEB SUBQ TISSUE 20 SQ  CM/< Modifier: Quantity: 1 CPT4 Code: Description: ICD-10 Diagnosis Description L89.153 Pressure ulcer of sacral region, stage 3 Modifier: Quantity: Physician Procedures CPT4  Code: 3151761 Description: WC PHYS LEVEL 3 o NEW PT Modifier: 25 Quantity: 1 CPT4 Code: Description: ICD-10 Diagnosis Description L89.153 Pressure ulcer of sacral region, stage 3 R00.1 Bradycardia, unspecified G47.30 Sleep apnea, unspecified Modifier: Quantity: CPT4 Code: [id number] Description: 62694 - WC PHYS SUBQ TISS 20 SQ CM Modifier: Quantity: 1 CPT4 Code: Description: ICD-10 Diagnosis Description L89.153 Pressure ulcer of sacral region, stage 3 Modifier: Quantity: Electronic Signature(s) Signed: 01/27/2022 12:06:58 PM By: Worthy Keeler PA-C Entered By: Worthy Keeler on 01/27/2022 12:06:58

## 2022-02-07 ENCOUNTER — Ambulatory Visit: Payer: Medicare Other | Admitting: Nurse Practitioner

## 2022-02-10 ENCOUNTER — Encounter: Payer: Medicare Other | Admitting: Physician Assistant

## 2022-02-10 DIAGNOSIS — L89153 Pressure ulcer of sacral region, stage 3: Secondary | ICD-10-CM | POA: Diagnosis not present

## 2022-02-10 NOTE — Progress Notes (Addendum)
ATHA, MURADYAN ([id number]) Visit Report for 02/10/2022 Arrival Information Details Patient Name: [name] Robinson, [name] Robinson Date of Service: 02/10/2022 1:00 PM Medical Record Number: [id number] Patient Account Number: [id number] Date of Birth/Sex: 1951/01/08 (71 y.o. F) Treating RN: Carlene Coria Primary Care Tyasia Packard: Marnee Guarneri Other Clinician: Referring Oaklyn Mans: Marnee Guarneri Treating Aramis Zobel/Extender: Skipper Cliche in Treatment: 2 Visit Information History Since Last Visit Added or deleted any medications: No Patient Arrived: Ambulatory Any new allergies or adverse reactions: No Arrival Time: 14:06 Had a fall or experienced change in No Accompanied By: husband activities of daily living that may affect Transfer Assistance: None risk of falls: Patient Identification Verified: Yes Hospitalized since last visit: No Secondary Verification Process Completed: Yes Has Dressing in Place as Prescribed: Yes Pain Present Now: No Electronic Signature(s) Signed: 02/10/2022 3:32:03 PM By: Carlene Coria RN Entered By: Carlene Coria on 02/10/2022 14:06:21 [name] Robinson ([id number]) -------------------------------------------------------------------------------- Clinic Level of Care Assessment Details Patient Name: [name] Robinson Date of Service: 02/10/2022 1:00 PM Medical Record Number: [id number] Patient Account Number: [id number] Date of Birth/Sex: 05/13/1951 (71 y.o. F) Treating RN: Levora Dredge Primary Care Elaynah Virginia: Marnee Guarneri Other Clinician: Referring Sylvanna Burggraf: Marnee Guarneri Treating Tore Carreker/Extender: Skipper Cliche in Treatment: 2 Clinic Level of Care Assessment Items TOOL 4 Quantity Score '[]'$  - Use when only an EandM is performed on FOLLOW-UP visit 0 ASSESSMENTS - Nursing Assessment / Reassessment X - Reassessment of Co-morbidities (includes updates in patient status) 1 10 X- 1 5 Reassessment of Adherence to Treatment Plan ASSESSMENTS - Wound and Skin Assessment /  Reassessment X - Simple Wound Assessment / Reassessment - one wound 1 5 '[]'$  - 0 Complex Wound Assessment / Reassessment - multiple wounds '[]'$  - 0 Dermatologic / Skin Assessment (not related to wound area) ASSESSMENTS - Focused Assessment '[]'$  - Circumferential Edema Measurements - multi extremities 0 '[]'$  - 0 Nutritional Assessment / Counseling / Intervention '[]'$  - 0 Lower Extremity Assessment (monofilament, tuning fork, pulses) '[]'$  - 0 Peripheral Arterial Disease Assessment (using hand held doppler) ASSESSMENTS - Ostomy and/or Continence Assessment and Care '[]'$  - Incontinence Assessment and Management 0 '[]'$  - 0 Ostomy Care Assessment and Management (repouching, etc.) PROCESS - Coordination of Care X - Simple Patient / Family Education for ongoing care 1 15 '[]'$  - 0 Complex (extensive) Patient / Family Education for ongoing care '[]'$  - 0 Staff obtains Programmer, systems, Records, Test Results / Process Orders '[]'$  - 0 Staff telephones HHA, Nursing Homes / Clarify orders / etc '[]'$  - 0 Routine Transfer to another Facility (non-emergent condition) '[]'$  - 0 Routine Hospital Admission (non-emergent condition) '[]'$  - 0 New Admissions / Biomedical engineer / Ordering NPWT, Apligraf, etc. '[]'$  - 0 Emergency Hospital Admission (emergent condition) X- 1 10 Simple Discharge Coordination '[]'$  - 0 Complex (extensive) Discharge Coordination PROCESS - Special Needs '[]'$  - Pediatric / Minor Patient Management 0 '[]'$  - 0 Isolation Patient Management '[]'$  - 0 Hearing / Language / Visual special needs '[]'$  - 0 Assessment of Community assistance (transportation, D/C planning, etc.) '[]'$  - 0 Additional assistance / Altered mentation '[]'$  - 0 Support Surface(s) Assessment (bed, cushion, seat, etc.) INTERVENTIONS - Wound Cleansing / Measurement Kiesler, Itzell ([id number]) X- 1 5 Simple Wound Cleansing - one wound '[]'$  - 0 Complex Wound Cleansing - multiple wounds X- 1 5 Wound Imaging (photographs - any number of wounds) '[]'$  -  0 Wound Tracing (instead of photographs) X- 1 5 Simple Wound Measurement - one wound '[]'$  - 0 Complex Wound Measurement - multiple wounds INTERVENTIONS - Wound  Dressings X - Small Wound Dressing one or multiple wounds 1 10 '[]'$  - 0 Medium Wound Dressing one or multiple wounds '[]'$  - 0 Large Wound Dressing one or multiple wounds '[]'$  - 0 Application of Medications - topical '[]'$  - 0 Application of Medications - injection INTERVENTIONS - Miscellaneous '[]'$  - External ear exam 0 '[]'$  - 0 Specimen Collection (cultures, biopsies, blood, body fluids, etc.) '[]'$  - 0 Specimen(s) / Culture(s) sent or taken to Lab for analysis '[]'$  - 0 Patient Transfer (multiple staff / Civil Service fast streamer / Similar devices) '[]'$  - 0 Simple Staple / Suture removal (25 or less) '[]'$  - 0 Complex Staple / Suture removal (26 or more) '[]'$  - 0 Hypo / Hyperglycemic Management (close monitor of Blood Glucose) '[]'$  - 0 Ankle / Brachial Index (ABI) - do not check if billed separately X- 1 5 Vital Signs Has the patient been seen at the hospital within the last three years: Yes Total Score: 75 Level Of Care: New/Established - Level 2 Electronic Signature(s) Signed: 02/10/2022 3:07:26 PM By: Levora Dredge Entered By: Levora Dredge on 02/10/2022 14:40:50 [name] Robinson ([id number]) -------------------------------------------------------------------------------- Encounter Discharge Information Details Patient Name: [name] Robinson Date of Service: 02/10/2022 1:00 PM Medical Record Number: [id number] Patient Account Number: [id number] Date of Birth/Sex: [birth year] (71 y.o. F) Treating RN: Levora Dredge Primary Care Rumaldo Difatta: Marnee Guarneri Other Clinician: Referring Ekam Bonebrake: Marnee Guarneri Treating Selen Smucker/Extender: Skipper Cliche in Treatment: 2 Encounter Discharge Information Items Discharge Condition: Stable Ambulatory Status: Walker Discharge Destination: Home Transportation: Private Auto Accompanied By: husband Schedule  Follow-up Appointment: Yes Clinical Summary of Care: Electronic Signature(s) Signed: 02/10/2022 2:42:50 PM By: Levora Dredge Entered By: Levora Dredge on 02/10/2022 14:42:49 [name] Robinson ([id number]) -------------------------------------------------------------------------------- Lower Extremity Assessment Details Patient Name: [name] Robinson Date of Service: 02/10/2022 1:00 PM Medical Record Number: [id number] Patient Account Number: [id number] Date of Birth/Sex: 11/30/1950 (71 y.o. F) Treating RN: Carlene Coria Primary Care Cayleb Jarnigan: Marnee Guarneri Other Clinician: Referring Airen Stiehl: Marnee Guarneri Treating Amyjo Mizrachi/Extender: Skipper Cliche in Treatment: 2 Electronic Signature(s) Signed: 02/10/2022 3:32:03 PM By: Carlene Coria RN Entered By: Carlene Coria on 02/10/2022 14:07:41 [name] Robinson ([id number]) -------------------------------------------------------------------------------- Multi Wound Chart Details Patient Name: [name] Robinson Date of Service: 02/10/2022 1:00 PM Medical Record Number: [id number] Patient Account Number: [id number] Date of Birth/Sex: February 04, 1951 (71 y.o. F) Treating RN: Carlene Coria Primary Care Jadiel Schmieder: Marnee Guarneri Other Clinician: Referring Rayma Hegg: Marnee Guarneri Treating Martyn Timme/Extender: Skipper Cliche in Treatment: 2 Vital Signs Height(in): 67 Pulse(bpm): 56 Weight(lbs): 124 Blood Pressure(mmHg): 106/58 Body Mass Index(BMI): 19.4 Temperature(F): 97.8 Respiratory Rate(breaths/min): 18 Photos: [N/A:N/A] Wound Location: Medial Sacrum N/A N/A Wounding Event: Pressure Injury N/A N/A Primary Etiology: Pressure Ulcer N/A N/A Comorbid History: Anemia, Sleep Apnea, Arrhythmia, N/A N/A History of pressure wounds Date Acquired: 07/05/2021 N/A N/A Weeks of Treatment: 2 N/A N/A Wound Status: Open N/A N/A Wound Recurrence: No N/A N/A Measurements L x W x D (cm) 0.8x0.3x0.2 N/A N/A Area (cm) : 0.188 N/A N/A Volume (cm) : 0.038  N/A N/A % Reduction in Area: 65.80% N/A N/A % Reduction in Volume: 30.90% N/A N/A Classification: Category/Stage III N/A N/A Exudate Amount: Medium N/A N/A Exudate Type: Serosanguineous N/A N/A Exudate Color: red, brown N/A N/A Granulation Amount: Large (67-100%) N/A N/A Granulation Quality: Pink N/A N/A Necrotic Amount: None Present (0%) N/A N/A Exposed Structures: Fat Layer (Subcutaneous Tissue): N/A N/A Yes Epithelialization: Medium (34-66%) N/A N/A Treatment Notes Electronic Signature(s) Signed: 02/10/2022 3:32:03 PM By: Carlene Coria RN Entered By: Carlene Coria on 02/10/2022 14:08:04 [name] Robinson,  [name] Robinson ([id number]) -------------------------------------------------------------------------------- Multi-Disciplinary Care Plan Details Patient Name: [name] Robinson, [name] Robinson Date of Service: 02/10/2022 1:00 PM Medical Record Number: [id number] Patient Account Number: [id number] Date of Birth/Sex: 13-Jan-1951 (71 y.o. F) Treating RN: Carlene Coria Primary Care Adamary Savary: Marnee Guarneri Other Clinician: Referring Shiana Rappleye: Marnee Guarneri Treating Kalup Jaquith/Extender: Skipper Cliche in Treatment: 2 Active Inactive Wound/Skin Impairment Nursing Diagnoses: Impaired tissue integrity Knowledge deficit related to ulceration/compromised skin integrity Goals: Ulcer/skin breakdown will have a volume reduction of 30% by week 4 Date Initiated: 01/27/2022 Target Resolution Date: 02/24/2022 Goal Status: Active Ulcer/skin breakdown will have a volume reduction of 50% by week 8 Date Initiated: 01/27/2022 Target Resolution Date: 03/24/2022 Goal Status: Active Ulcer/skin breakdown will have a volume reduction of 80% by week 12 Date Initiated: 01/27/2022 Target Resolution Date: 04/21/2022 Goal Status: Active Ulcer/skin breakdown will heal within 14 weeks Date Initiated: 01/27/2022 Target Resolution Date: 05/05/2022 Goal Status: Active Interventions: Assess patient/caregiver ability to obtain necessary  supplies Assess patient/caregiver ability to perform ulcer/skin care regimen upon admission and as needed Assess ulceration(s) every visit Provide education on ulcer and skin care Notes: Electronic Signature(s) Signed: 02/10/2022 3:32:03 PM By: Carlene Coria RN Entered By: Carlene Coria on 02/10/2022 14:07:58 [name] Robinson ([id number]) -------------------------------------------------------------------------------- Pain Assessment Details Patient Name: [name] Robinson Date of Service: 02/10/2022 1:00 PM Medical Record Number: [id number] Patient Account Number: [id number] Date of Birth/Sex: 10-02-1950 (71 y.o. F) Treating RN: Carlene Coria Primary Care Tersa Fotopoulos: Marnee Guarneri Other Clinician: Referring Jaye Saal: Marnee Guarneri Treating Ryan Palermo/Extender: Skipper Cliche in Treatment: 2 Active Problems Location of Pain Severity and Description of Pain Patient Has Paino No Site Locations Rate the pain. Current Pain Level: 0 Pain Management and Medication Current Pain Management: Electronic Signature(s) Signed: 02/10/2022 3:32:03 PM By: Carlene Coria RN Entered By: Carlene Coria on 02/10/2022 14:06:55 [name] Robinson ([id number]) -------------------------------------------------------------------------------- Patient/Caregiver Education Details Patient Name: [name] Robinson Date of Service: 02/10/2022 1:00 PM Medical Record Number: [id number] Patient Account Number: [id number] Date of Birth/Gender: 12-Feb-1951 (71 y.o. F) Treating RN: Levora Dredge Primary Care Physician: Marnee Guarneri Other Clinician: Referring Physician: Marnee Guarneri Treating Physician/Extender: Skipper Cliche in Treatment: 2 Education Assessment Education Provided To: Patient Education Topics Provided Wound/Skin Impairment: Handouts: Caring for Your Ulcer Methods: Explain/Verbal Responses: State content correctly Electronic Signature(s) Signed: 02/10/2022 3:07:26 PM By: Levora Dredge Entered By:  Levora Dredge on 02/10/2022 14:41:49 [name] Robinson ([id number]) -------------------------------------------------------------------------------- Wound Assessment Details Patient Name: [name] Robinson Date of Service: 02/10/2022 1:00 PM Medical Record Number: [id number] Patient Account Number: [id number] Date of Birth/Sex: June 23, 1951 (71 y.o. F) Treating RN: Carlene Coria Primary Care Terisha Losasso: Marnee Guarneri Other Clinician: Referring Corwin Kuiken: Marnee Guarneri Treating Gwenyth Dingee/Extender: Skipper Cliche in Treatment: 2 Wound Status Wound Number: 1 Primary Pressure Ulcer Etiology: Wound Location: Medial Sacrum Wound Status: Open Wounding Event: Pressure Injury Comorbid Anemia, Sleep Apnea, Arrhythmia, History of pressure Date Acquired: 07/05/2021 History: wounds Weeks Of Treatment: 2 Clustered Wound: No Photos Wound Measurements Length: (cm) 0.8 Width: (cm) 0.3 Depth: (cm) 0.2 Area: (cm) 0.188 Volume: (cm) 0.038 % Reduction in Area: 65.8% % Reduction in Volume: 30.9% Epithelialization: Medium (34-66%) Tunneling: No Undermining: No Wound Description Classification: Category/Stage III Exudate Amount: Medium Exudate Type: Serosanguineous Exudate Color: red, brown Foul Odor After Cleansing: No Slough/Fibrino No Wound Bed Granulation Amount: Large (67-100%) Exposed Structure Granulation Quality: Pink Fat Layer (Subcutaneous Tissue) Exposed: Yes Necrotic Amount: None Present (0%) Treatment Notes Wound #1 (Sacrum) Wound Laterality: Medial Cleanser Soap and Water Discharge Instruction: Gently cleanse wound with antibacterial soap, rinse and pat dry  prior to dressing wounds Peri-Wound Care Topical Primary Dressing [address] Ready Transfer Foam, 4x5 (in/in) [name] Robinson, [name] Robinson ([id number]) Discharge Instruction: Apply Hydrofera Blue Ready to wound bed as directed. If using blue with plastic backing please cut slits in it to allow drainage to come through. Also make  sure plastic backing is not placed face down on wound. Secondary Dressing (SILICONE BORDER) Zetuvit Plus SILICONE BORDER Dressing 4x4 (in/in) Discharge Instruction: Please do not put silicone bordered dressings under wraps. Use non-bordered dressing only. Secured With Compression Wrap Compression Stockings Environmental education officer) Signed: 02/10/2022 3:32:03 PM By: Carlene Coria RN Entered By: Carlene Coria on 02/10/2022 14:07:30 [name] Robinson ([id number]) -------------------------------------------------------------------------------- Vitals Details Patient Name: [name] Robinson Date of Service: 02/10/2022 1:00 PM Medical Record Number: [id number] Patient Account Number: [id number] Date of Birth/Sex: May 25, 1951 (71 y.o. F) Treating RN: Carlene Coria Primary Care Cassandre Oleksy: Marnee Guarneri Other Clinician: Referring Antoin Dargis: Marnee Guarneri Treating Nema Oatley/Extender: Skipper Cliche in Treatment: 2 Vital Signs Time Taken: 14:06 Temperature (F): 97.8 Height (in): 67 Pulse (bpm): 56 Weight (lbs): 124 Respiratory Rate (breaths/min): 18 Body Mass Index (BMI): 19.4 Blood Pressure (mmHg): 106/58 Reference Range: 80 - 120 mg / dl Electronic Signature(s) Signed: 02/10/2022 3:32:03 PM By: Carlene Coria RN Entered By: Carlene Coria on 02/10/2022 14:06:49

## 2022-02-10 NOTE — Progress Notes (Addendum)
CAIRO, LINGENFELTER ([id number]) Visit Report for 02/10/2022 Chief Complaint Document Details Patient Name: [name] Robinson, [name] Robinson Date of Service: 02/10/2022 1:00 PM Medical Record Number: [id number] Patient Account Number: [id number] Date of Birth/Sex: 1951-02-01 (71 y.o. F) Treating RN: Levora Dredge Primary Care Provider: Marnee Guarneri Other Clinician: Referring Provider: Marnee Guarneri Treating Provider/Extender: Skipper Cliche in Treatment: 2 Information Obtained from: Patient Chief Complaint Pressure ulcer sacral region Electronic Signature(s) Signed: 02/10/2022 2:04:48 PM By: Worthy Keeler PA-C Entered By: Worthy Keeler on 02/10/2022 14:04:48 [name] Robinson ([id number]) -------------------------------------------------------------------------------- HPI Details Patient Name: [name] Robinson Date of Service: 02/10/2022 1:00 PM Medical Record Number: [id number] Patient Account Number: [id number] Date of Birth/Sex: 1950-09-23 (71 y.o. F) Treating RN: Levora Dredge Primary Care Provider: Marnee Guarneri Other Clinician: Referring Provider: Marnee Guarneri Treating Provider/Extender: Skipper Cliche in Treatment: 2 History of Present Illness HPI Description: 01-27-2022 upon evaluation today patient appears for initial evaluation here in our clinic concerning issues she is having with a pressure area over the sacral region. Its a question of whether this is a stage III or stage IV pressure ulcer. The patient believes that it did go down to bone documentation in the paperwork says fat layer either way this is definitely at least a 3. I do see signs of actually good healing compared to the size it was in the past and the patient concurs with this. With that being said she was in septic shock and not turned significantly to offload appropriately as what the patient tells me occurred which led to the pressure ulcer. Subsequently she had a wound VAC on initially and this was discontinued  somewhere around mid May by 01-11-2022 the note we have for review states the Wound VAC has been off for 2 weeks and the wound is improving. Patient does have a history of bradycardia as well as sleep apnea but no other major medical problems. 02-10-2022 upon evaluation today patient actually appears to be doing excellent in regard to her wound. She has been tolerating the dressing changes without complication. Fortunately there does not appear to be any signs of active infection at this time which is great news. No fevers, chills, nausea, vomiting, or diarrhea. Electronic Signature(s) Signed: 02/10/2022 2:30:41 PM By: Worthy Keeler PA-C Entered By: Worthy Keeler on 02/10/2022 14:30:41 [name] Robinson ([id number]) -------------------------------------------------------------------------------- Physical Exam Details Patient Name: [name] Robinson Date of Service: 02/10/2022 1:00 PM Medical Record Number: [id number] Patient Account Number: [id number] Date of Birth/Sex: 1951/03/14 (71 y.o. F) Treating RN: Levora Dredge Primary Care Provider: Marnee Guarneri Other Clinician: Referring Provider: Marnee Guarneri Treating Provider/Extender: Skipper Cliche in Treatment: 2 Constitutional Well-nourished and well-hydrated in no acute distress. Respiratory normal breathing without difficulty. Psychiatric this patient is able to make decisions and demonstrates good insight into disease process. Alert and Oriented x 3. pleasant and cooperative. Notes Upon inspection patient's wound bed actually showed signs of good granulation and epithelization at this point. Fortunately I do not see any evidence of infection at this time which is great and overall I am extremely pleased. I do think she is on the right track this is just can take a little bit of time to completely closed. Electronic Signature(s) Signed: 02/10/2022 2:31:00 PM By: Worthy Keeler PA-C Entered By: Worthy Keeler on 02/10/2022  14:31:00 [name] Robinson ([id number]) -------------------------------------------------------------------------------- Physician Orders Details Patient Name: [name] Robinson Date of Service: 02/10/2022 1:00 PM Medical Record Number: [id number] Patient Account Number: [id number] Date of Birth/Sex: 1950-08-31 (71 y.o. 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 May utilize formulary equivalent dressing for wound treatment orders unless otherwise specified. Home Health Nurse may visit PRN to address patientos wound care needs. o Scheduled days for dressing changes to be completed; exception, patient has scheduled wound care visit that day. o **Please direct any NON-WOUND related issues/requests for orders to patient's Primary Care Physician. **If current dressing causes regression in wound condition, may D/C ordered dressing product/s and apply Normal Saline Moist Dressing daily until next [place] or Other MD appointment. **Notify [facility]aling [facility] of regression in wound condition at [phone]. Bathing/ Shower/ Hygiene o May shower; gently cleanse wound with antibacterial soap, rinse and pat dry prior to dressing wounds o No tub bath. o Other: - if swimming, please cover wound with a tegaderm and change after exiting pool, was with recommended dial soap and replace with ordered dressing. Wound Treatment Wound #1 - Sacrum Wound Laterality:  Medial Cleanser: Soap and Water 3 x Per Week/30 Days Discharge Instructions: Gently cleanse wound with antibacterial soap, rinse and pat dry prior to dressing wounds Primary Dressing: Hydrofera Blue Ready Transfer Foam, 4x5 (in/in) 3 x Per Week/30 Days Discharge Instructions: Apply Hydrofera Blue Ready to wound bed as directed. If using blue with plastic backing please cut slits in it to allow drainage to come through. Also make sure plastic backing is not placed face down on wound. Secondary Dressing: (SILICONE BORDER) Zetuvit Plus SILICONE BORDER Dressing 4x4 (in/in) 3 x Per Week/30 Days Discharge Instructions: Please do not put silicone bordered dressings under wraps. Use non-bordered dressing only. Electronic Signature(s) Signed: 02/10/2022 3:32:03 PM By: Carlene Coria RN Signed: 02/10/2022 4:59:17 PM By: Worthy Keeler PA-C Entered By: Carlene Coria on 02/10/2022 14:30:33 [name] Robinson ([id number]) -------------------------------------------------------------------------------- Problem List Details Patient Name: [name] Robinson Date of Service: 02/10/2022 1:00 PM Medical Record Number: [id number] Patient Account Number: [id number] Date of Birth/Sex: 1951/04/13 (71 y.o. 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 F) Treating RN: Levora Dredge Primary Care Provider: Marnee Guarneri Other Clinician: Referring Provider: Marnee Guarneri Treating Provider/Extender: Skipper Cliche in Treatment: 2 Subjective Chief Complaint Information obtained from Patient Pressure ulcer sacral region History of Present Illness (HPI) 01-27-2022 upon evaluation today patient appears for initial evaluation here in our clinic concerning issues she is having with a pressure area over the sacral region. Its a question of whether this is a stage III or stage IV pressure ulcer. The patient believes that  it did go down to bone documentation in the paperwork says fat layer either way this is definitely at least a 3. I do see signs of actually good healing compared to the size it was in the past and the patient concurs with this. With that being said she was in septic shock and not turned significantly to offload appropriately as what the patient tells me occurred which led to the pressure ulcer. Subsequently she had a wound VAC on initially and this was discontinued somewhere around mid May by 01-11-2022 the note we have for review states the Wound VAC has been off for 2 weeks and the wound is improving. Patient does have a history of bradycardia as well as sleep apnea but no other major medical problems. 02-10-2022 upon evaluation today patient actually appears to be doing excellent in regard to her wound. She has been tolerating the dressing changes without complication. Fortunately there does not appear to be any signs of active infection at this time which is great news. No fevers, chills, nausea, vomiting, or diarrhea. Objective Constitutional Well-nourished and well-hydrated in no acute distress. Vitals Time Taken: 2:06 PM, Height: 67 in,  Weight: 124 lbs, BMI: 19.4, Temperature: 97.8 F, Pulse: 56 bpm, Respiratory Rate: 18 breaths/min, Blood Pressure: 106/58 mmHg. Respiratory normal breathing without difficulty. Psychiatric this patient is able to make decisions and demonstrates good insight into disease process. Alert and Oriented x 3. pleasant and cooperative. General Notes: Upon inspection patient's wound bed actually showed signs of good granulation and epithelization at this point. Fortunately I do not see any evidence of infection at this time which is great and overall I am extremely pleased. I do think she is on the right track this is just can take a little bit of time to completely closed. Integumentary (Hair, Skin) Wound #1 status is Open. Original cause of wound was Pressure Injury. The date acquired was: 07/05/2021. The wound has been in treatment 2 weeks. The wound is located on the Medial Sacrum. The wound measures 0.8cm length x 0.3cm width x 0.2cm depth; 0.188cm^2 area and 0.038cm^3 volume. There is Fat Layer (Subcutaneous Tissue) exposed. There is no tunneling or undermining noted. There is a medium amount of serosanguineous drainage noted. There is large (67-100%) pink granulation within the wound bed. There is no necrotic tissue within the wound bed. Assessment Active Problems ICD-10 [name] Robinson, [name] Robinson ([id number]) Pressure ulcer of sacral region, stage 3 Bradycardia, unspecified Sleep apnea, unspecified Plan Follow-up Appointments: Return Appointment in 1 month - per pt request Nurse Visit as needed Home Health: [place]: - [place] for wound care. May utilize formulary equivalent dressing for wound treatment orders unless otherwise specified. Home Health Nurse may visit PRN to address patient s wound care needs. Scheduled days for dressing changes to be completed; exception, patient has scheduled wound care visit that day. **Please direct any NON-WOUND related  issues/requests for orders to patient's Primary Care Physician. **If current dressing causes regression in wound condition, may D/C ordered dressing product/s and apply Normal Saline Moist Dressing daily until next [place] or Other MD appointment. **Notify Wound [facility]aling Center of regression in wound condition at [phone]. Bathing/ Shower/ Hygiene: May shower; gently cleanse wound with antibacterial soap, rinse and pat dry prior to dressing wounds No tub bath. Other: - if swimming, please cover wound with a tegaderm and change after exiting pool, was with recommended dial soap and replace with ordered dressing. WOUND #1: - Sacrum Wound Laterality: Medial  Cleanser: Soap and Water 3 x Per Week/30 Days Discharge Instructions: Gently cleanse wound with antibacterial soap, rinse and pat dry prior to dressing wounds Primary Dressing: Hydrofera Blue Ready Transfer Foam, 4x5 (in/in) 3 x Per Week/30 Days Discharge Instructions: Apply Hydrofera Blue Ready to wound bed as directed. If using blue with plastic backing please cut slits in it to allow drainage to come through. Also make sure plastic backing is not placed face down on wound. Secondary Dressing: (SILICONE BORDER) Zetuvit Plus SILICONE BORDER Dressing 4x4 (in/in) 3 x Per Week/30 Days Discharge Instructions: Please do not put silicone bordered dressings under wraps. Use non-bordered dressing only. 1. I would recommend that we going continue with the wound care measures as before and the patient is in agreement with the plan. This includes the use of the [facility] which I think is doing an excellent job. 2. I am also can recommend that we continue with the bordered foam dressing to cover. 3. I would also suggest that she continue to monitor for any signs of worsening or infection. We will see patient back for reevaluation in 1 month here in the clinic. If anything worsens or changes patient will contact our office for  additional recommendations. This was per patient's request for the 1 month follow-up she does have home health. Electronic Signature(s) Signed: 02/10/2022 2:31:35 PM By: Worthy Keeler PA-C Entered By: Worthy Keeler on 02/10/2022 14:31:35 [name] Robinson ([id number]) -------------------------------------------------------------------------------- SuperBill Details Patient Name: [name] Robinson Date of Service: 02/10/2022 Medical Record Number: [id number] Patient Account Number: [id number] Date of Birth/Sex: 1950/08/30 (71 y.o. F) Treating RN: Levora Dredge Primary Care Provider: Marnee Guarneri Other Clinician: Referring Provider: Marnee Guarneri Treating Provider/Extender: Skipper Cliche in Treatment: 2 Diagnosis Coding ICD-10 Codes Code Description [phone] Pressure ulcer of sacral region, stage 3 R00.1 Bradycardia, unspecified G47.30 Sleep apnea, unspecified Facility Procedures CPT4 Code: [id number] Description: [phone] - WOUND CARE VISIT-LEV 2 EST PT Modifier: Quantity: 1 Physician Procedures CPT4 Code: [id number] Description: 01779 - WC PHYS LEVEL 3 - EST PT Modifier: Quantity: 1 CPT4 Code: Description: ICD-10 Diagnosis Description L89.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

## 2022-02-13 ENCOUNTER — Other Ambulatory Visit: Payer: Medicare Other | Admitting: Primary Care

## 2022-02-13 ENCOUNTER — Other Ambulatory Visit: Payer: Self-pay | Admitting: Nurse Practitioner

## 2022-02-13 DIAGNOSIS — Z515 Encounter for palliative care: Secondary | ICD-10-CM

## 2022-02-13 DIAGNOSIS — G8929 Other chronic pain: Secondary | ICD-10-CM

## 2022-02-13 DIAGNOSIS — R29898 Other symptoms and signs involving the musculoskeletal system: Secondary | ICD-10-CM

## 2022-02-13 DIAGNOSIS — F419 Anxiety disorder, unspecified: Secondary | ICD-10-CM

## 2022-02-13 NOTE — Telephone Encounter (Signed)
Medication Refill - Medication: gabapentin (NEURONTIN) 300 MG capsule [[id number]]   Has the patient contacted their pharmacy? No. (Agent: If no, request that the patient contact the pharmacy for the refill. If patient does not wish to contact the pharmacy document the reason why and proceed with request.) (Agent: If yes, when and what did the pharmacy advise?)  Preferred Pharmacy (with phone number or street name):  [id number] [place] [address]      Has the patient been seen for an appointment in the last year OR does the patient have an upcoming appointment? Yes.    Agent: Please be advised that RX refills may take up to 3 business days. We ask that you follow-up with your pharmacy.

## 2022-02-13 NOTE — Progress Notes (Signed)
Therapist, nutritional Palliative Care Consult Note Telephone: [phone]  Fax: [phone]    Date of encounter: 02/13/22 10:31 AM PATIENT NAME: [name] Robinson [address] [name] Robinson [facility] [address]   [phone] (home)  DOB: 08-02-1951 MRN: [id number] PRIMARY CARE PROVIDER:    Marjie Skiff, NP,  [address] [place] [address] [phone]  REFERRING PROVIDER:   Marjie Skiff, NP [address] [place],  [place] [address] [phone]  RESPONSIBLE PARTY:    Contact Information     Name Relation Home Work Mobile   [name], Robinson Spouse   [phone]   [name] Robinson,[name] Daughter   [phone]        I met face to face with patient and family in home. Palliative Care was asked to follow this patient by consultation request of  [name] Skiff, NP to address advance care planning and complex medical decision making. This is a follow up visit.                                   ASSESSMENT AND PLAN / RECOMMENDATIONS:   Advance Care Planning/Goals of Care: Goals include to maximize quality of life and symptom management. Patient/health care surrogate gave his/her permission to discuss.Our advance care planning conversation included a discussion about:    The value and importance of advance care planning  Experiences with loved ones who have been seriously ill or have died  Exploration of personal, cultural or spiritual beliefs that might influence medical decisions  Exploration of goals of care in the event of a sudden injury or illness  Identification of a healthcare agent - husband Review  of an  advance directive document . Will discuss with husband and will complete  CODE STATUS: FULL   Symptom Management/Plan:  Thyroid: Still experiencing anxiety, shaking not related to chill or medications. I recommend referral to endocrine for assessment of thyroid dysfunction. Endorses ongoing neuropathy. She is already on gabapentin and cymbalta  so I'd  recommend endocrine before changing more mood stabilizers.  Depression: Has established with a psychiatrist. Voices ongoing sx of trauma. She is now in cymbalta, celexa and buspar.  Is feeling better and went to visit daughter and is becoming involved in her community. Beginning to read for pleasure. Discussed getting counseling for trauma and some resources given.  Mobility: Improved with PT, keep up HEP, uses walker.   Medications reviewed and updated.  Follow up Palliative Care Visit: Palliative care will continue to follow for complex medical decision making, advance care planning, and clarification of goals. Return 4-6 weeks or prn.  I spent 60 minutes providing this consultation. More than 50% of the time in this consultation was spent in counseling and care coordination  PPS: 40%  HOSPICE ELIGIBILITY/DIAGNOSIS: TBD  Chief Complaint: debility, weight loss.  HISTORY OF PRESENT ILLNESS:  [name] Robinson is a 71 y.o. year old female  with weight loss, debility, s/p extensive abdominal surgery, healing stage 4 ulcer . Patient seen today to review palliative care needs to include medical decision making and advance care planning as appropriate.   History obtained from review of EMR, discussion with primary team, and interview with family, facility staff/caregiver and/or [name] Robinson.  I reviewed available labs, medications, imaging, studies and related documents from the EMR.  Records reviewed and summarized above.   ROS  General: NAD EYES: denies vision changes, has appt in Oct. ENMT: denies dysphagia  Cardiovascular: denies chest pain, denies DOE Pulmonary: denies cough, denies increased SOB Abdomen: endorses good appetite, denies constipation,  endorses large bms, endorses continence of bowel GU: denies dysuria, endorses continence of urine MSK:  denies  increased weakness,  no falls reported Skin: small wound on sacrum. Neurological: endorses abdominal pain, denies  insomnia Psych: Endorses positive mood, with crying spells.  Physical Exam: Current and past weights:130 lbs some edema Constitutional: NAD General: frail appearing, thin EYES: anicteric sclera, lids intact, no discharge  ENMT: intact hearing, oral mucous membranes moist, dentition intact CV: 1-2+  LE edema Pulmonary:no increased work of breathing, no cough, room air Abdomen: intake 70%, no ascites MSK:+ sarcopenia, moves all extremities, ambulatory with walker  Skin: warm and dry, no rashes or wounds on visible skin Neuro:  + generalized weakness,  no cognitive impairment, anxious affect when discussing recent hospitalization ( fall 2022) .   Thank you for the opportunity to participate in the care of [name] Robinson.  The palliative care team will continue to follow. Please call our office at [phone] if we can be of additional assistance.   Jason Coop, NP DNP, AGPCNP-BC  COVID-19 PATIENT SCREENING TOOL Asked and negative response unless otherwise noted:   Have you had symptoms of covid, tested positive or been in contact with someone with symptoms/positive test in the past 5-10 days?

## 2022-02-15 ENCOUNTER — Telehealth: Payer: Self-pay | Admitting: Nurse Practitioner

## 2022-02-15 ENCOUNTER — Other Ambulatory Visit: Payer: Medicare Other

## 2022-02-15 DIAGNOSIS — E440 Moderate protein-calorie malnutrition: Secondary | ICD-10-CM

## 2022-02-15 DIAGNOSIS — E063 Autoimmune thyroiditis: Secondary | ICD-10-CM

## 2022-02-15 DIAGNOSIS — D508 Other iron deficiency anemias: Secondary | ICD-10-CM

## 2022-02-15 DIAGNOSIS — D510 Vitamin B12 deficiency anemia due to intrinsic factor deficiency: Secondary | ICD-10-CM

## 2022-02-15 MED ORDER — GABAPENTIN 300 MG PO CAPS
300.0000 mg | ORAL_CAPSULE | Freq: Three times a day (TID) | ORAL | 12 refills | Status: DC
Start: 2022-02-15 — End: 2022-02-20

## 2022-02-15 NOTE — Telephone Encounter (Signed)
Spoke with patient and notified her of Jolene's recommendations and patient verbalized understanding and says she will try and stop by today to have her labs drawn.

## 2022-02-15 NOTE — Telephone Encounter (Signed)
Requested medication (s) are due for refill today: Amount not specified  Requested medication (s) are on the active medication list: yes    Last refill: 11/15/21  Amount not specified  Future visit scheduled yes 02/20/22  Notes to clinic:Historical provider, please review  Requested Prescriptions  Pending Prescriptions Disp Refills   gabapentin (NEURONTIN) 300 MG capsule      Sig: Take 1 capsule (300 mg total) by mouth 3 (three) times daily.     Neurology: Anticonvulsants - gabapentin Passed - 02/13/2022  1:43 PM      Passed - Cr in normal range and within 360 days    Creatinine, Ser  Date Value Ref Range Status  12/14/2021 0.60 0.57 - 1.00 mg/dL Final         Passed - Completed PHQ-2 or PHQ-9 in the last 360 days      Passed - Valid encounter within last 12 months    Recent Outpatient Visits           1 month ago Skin ulcer of sacrum with fat layer exposed ([place])   [place], Jolene T, NP   2 months ago Skin ulcer of sacrum with fat layer exposed ([place])   [place], Jolene T, NP   3 months ago Septic shock ([place])   [place] Cann[place], Jolene T, NP   3 months ago Septic shock ([place])   [place] Cannady, Jolene T, NP   8 months ago Painful urination   [place], MD       Future Appointments             In 5 days Cannady, Barbaraann Faster, NP [employer]

## 2022-02-15 NOTE — Telephone Encounter (Signed)
Referral Request - Has patient seen PCP for this complaint? Yes.    Referral for which specialty: Endocrinology  Preferred provider/office: [facility] [place], [place] [address]  Reason for referral: thyroid specialist due to dosage changes of levothyroxine (SYNTHROID) 25 MCG tablet  Patient states dosage changes is not helping and prefers a specialist

## 2022-02-15 NOTE — Telephone Encounter (Signed)
Noted  

## 2022-02-15 NOTE — Telephone Encounter (Signed)
Routing to provider to advise.  

## 2022-02-16 LAB — T4, FREE: Free T4: 1 ng/dL (ref 0.82–1.77)

## 2022-02-16 LAB — CBC WITH DIFFERENTIAL/PLATELET
Basophils Absolute: 0 10*3/uL (ref 0.0–0.2)
Basos: 1 %
EOS (ABSOLUTE): 0 10*3/uL (ref 0.0–0.4)
Eos: 1 %
Hematocrit: 33.9 % — ABNORMAL LOW (ref 34.0–46.6)
Hemoglobin: 11.5 g/dL (ref 11.1–15.9)
Immature Grans (Abs): 0 10*3/uL (ref 0.0–0.1)
Immature Granulocytes: 0 %
Lymphocytes Absolute: 1.3 10*3/uL (ref 0.7–3.1)
Lymphs: 36 %
MCH: 30.5 pg (ref 26.6–33.0)
MCHC: 33.9 g/dL (ref 31.5–35.7)
MCV: 90 fL (ref 79–97)
Monocytes Absolute: 0.2 10*3/uL (ref 0.1–0.9)
Monocytes: 6 %
Neutrophils Absolute: 2.2 10*3/uL (ref 1.4–7.0)
Neutrophils: 56 %
Platelets: 168 10*3/uL (ref 150–450)
RBC: 3.77 x10E6/uL (ref 3.77–5.28)
RDW: 14.4 % (ref 11.7–15.4)
WBC: 3.8 10*3/uL (ref 3.4–10.8)

## 2022-02-16 LAB — PTH, INTACT AND CALCIUM: PTH: 21 pg/mL (ref 15–65)

## 2022-02-16 LAB — FERRITIN: Ferritin: 985 ng/mL — ABNORMAL HIGH (ref 15–150)

## 2022-02-16 LAB — COMPREHENSIVE METABOLIC PANEL
ALT: 29 IU/L (ref 0–32)
AST: 25 IU/L (ref 0–40)
Albumin/Globulin Ratio: 1.8 (ref 1.2–2.2)
Albumin: 4.4 g/dL (ref 3.8–4.8)
Alkaline Phosphatase: 102 IU/L (ref 44–121)
BUN/Creatinine Ratio: 30 — ABNORMAL HIGH (ref 12–28)
BUN: 17 mg/dL (ref 8–27)
Bilirubin Total: 0.4 mg/dL (ref 0.0–1.2)
CO2: 27 mmol/L (ref 20–29)
Calcium: 9.8 mg/dL (ref 8.7–10.3)
Chloride: 105 mmol/L (ref 96–106)
Creatinine, Ser: 0.57 mg/dL (ref 0.57–1.00)
Globulin, Total: 2.5 g/dL (ref 1.5–4.5)
Glucose: 71 mg/dL (ref 70–99)
Potassium: 3.6 mmol/L (ref 3.5–5.2)
Sodium: 144 mmol/L (ref 134–144)
Total Protein: 6.9 g/dL (ref 6.0–8.5)
eGFR: 98 mL/min/{1.73_m2} (ref >59)

## 2022-02-16 LAB — TSH: TSH: 3.59 u[IU]/mL (ref 0.450–4.500)

## 2022-02-16 LAB — IRON AND TIBC
Iron Saturation: 31 % (ref 15–55)
Iron: 85 ug/dL (ref 27–139)
Total Iron Binding Capacity: 274 ug/dL (ref 250–450)
UIBC: 189 ug/dL (ref 118–369)

## 2022-02-16 LAB — FOLATE: Folate: >20 ng/mL (ref >3.0)

## 2022-02-16 LAB — VITAMIN B12: Vitamin B-12: 995 pg/mL (ref 232–1245)

## 2022-02-16 NOTE — Addendum Note (Signed)
Addended by: Marnee Guarneri T on: 02/16/2022 09:23 AM   Modules accepted: Orders

## 2022-02-16 NOTE — Progress Notes (Signed)
Contacted via [place] -- her thyroid labs have now normalized, great news.  We are going to get a thyroid ultrasound to further assess, but holding off on endo referral at this time as improvement being seen.   Good morning [name] Robinson, I have good news your thyroid labs have now normalized!!  Great news!!  I will still order thyroid ultrasound to get a look at thyroid, but stay on current Levothyroxine dosing of 25 MCG.  We do not need specialist referral at this time.  Overall remainder of labs are starting to show improvement too.  I was happy to see this and to see you looking better!!  We are on the right path.  Any questions? Keep being incredible!!  Thank you for allowing me to participate in your care.  I appreciate you. Kindest regards, Calea Hribar

## 2022-02-18 NOTE — Patient Instructions (Signed)

## 2022-02-20 ENCOUNTER — Encounter: Payer: Self-pay | Admitting: Nurse Practitioner

## 2022-02-20 ENCOUNTER — Ambulatory Visit (INDEPENDENT_AMBULATORY_CARE_PROVIDER_SITE_OTHER): Payer: Medicare Other | Admitting: Nurse Practitioner

## 2022-02-20 VITALS — BP 105/64 | HR 55 | Temp 98.3°F | Ht 67.0 in | Wt 133.4 lb

## 2022-02-20 DIAGNOSIS — E063 Autoimmune thyroiditis: Secondary | ICD-10-CM | POA: Diagnosis not present

## 2022-02-20 DIAGNOSIS — E440 Moderate protein-calorie malnutrition: Secondary | ICD-10-CM | POA: Diagnosis not present

## 2022-02-20 DIAGNOSIS — L98422 Non-pressure chronic ulcer of back with fat layer exposed: Secondary | ICD-10-CM

## 2022-02-20 DIAGNOSIS — F419 Anxiety disorder, unspecified: Secondary | ICD-10-CM

## 2022-02-20 DIAGNOSIS — G629 Polyneuropathy, unspecified: Secondary | ICD-10-CM

## 2022-02-20 DIAGNOSIS — R29898 Other symptoms and signs involving the musculoskeletal system: Secondary | ICD-10-CM

## 2022-02-20 DIAGNOSIS — Z8619 Personal history of other infectious and parasitic diseases: Secondary | ICD-10-CM

## 2022-02-20 MED ORDER — GABAPENTIN 300 MG PO CAPS: ORAL_CAPSULE | ORAL | 4 refills | Status: DC

## 2022-02-20 NOTE — Assessment & Plan Note (Signed)
Stabilizing at this time and improved from recent visit, with weight gain now presenting.  Have recommended continue supplements at home 3-4 times a day.  Continue to collaborate with psychiatry + referral placed for therapy, would benefit extensive CBT.  Continue collaboration with palliative.

## 2022-02-20 NOTE — Assessment & Plan Note (Signed)
Post hospitalization with septic shock, worse at night.  Labs up to date.  Will increase Gabapentin to 300 MG morning and noon, then 600 MG at night for discomfort. Recheck labs next visit.

## 2022-02-20 NOTE — Assessment & Plan Note (Signed)
Is beginning to gain, has gained 11 pounds since recent visit and thyroid is stabilizing. Loss related to 4 month hospitalization and deconditioning.  Recommend continue lactose free supplements 3-4 times daily, as Boost caused nausea.  Ensure eating frequent, small, healthy meals.  Labs next visit.

## 2022-02-20 NOTE — Assessment & Plan Note (Signed)
Chronic, ongoing with PTSD aspect present from lengthy hospitalization with septic shock.  Denies SI/HI.  Continue Celexa 20 MG daily and Buspar 15 MG TID + Trazodone.  Continue palliative collaboration + psychiatry.  Referral to therapy placed as would benefit from extensive CBT with underlying PTSD symptoms present.

## 2022-02-20 NOTE — Progress Notes (Signed)
BP 105/64   Pulse (!) 55   Temp 98.3 F (36.8 C) (Oral)   Ht 5\' 7"  (1.702 m)   Wt 133 lb 6.4 oz (60.5 kg)   SpO2 98%   BMI 20.89 kg/m    Subjective:    Patient ID: [name] Robinson, female    DOB: [birth year], 71 y.o.   MRN: [id number]  HPI: [name] Robinson is a 71 y.o. female  Chief Complaint  Patient presents with   Weight Check   Mood    Patient says today she has has an episode of crying and shaking.    Hypothyroidism   Medication Dose Change    Patient says she would like to discuss with provider about an dose increase on her Gabapentin prescription.    HYPOTHYROIDISM Currently to be taking 25 MCG Levothyroxine. Her recent labs were euthyroid, but she would like to see therapist as she is concerned abut ongoing random shakes and tearfulness.  Thyroid ultrasound has been ordered -- she is scheduled for 02/23/22.  Continues to have neuropathy pain to both feet, worse at night.   Thyroid control status: stable Satisfied with current treatment? yes Medication side effects: no Medication compliance: good compliance Etiology of hypothyroidism: Hashimoto's Recent dose adjustment:yes Fatigue: no Cold intolerance: no Heat intolerance: no Weight gain: no Weight loss: improving now Constipation: improving -- 2 bowel movement a day Diarrhea/loose stools: no Palpitations: none Lower extremity edema: no Anxiety/depressed mood: yes   WOUND & WEIGHT CHECK: Overall wound is improving with home health.  Home health is going to house and palliative had follow-up on 02/13/22.  Followed by Dr. Melba Coon at bariatric surgery at [facility], last visit 02/01/22, telemedicine. Had been on TPN in hospital for period - hospitalized for 4 months.  Gaining weight on this check, up 11 pounds since last visit.   Saw Dr. [name] for anemia on 11/28/21. Duration: months Location: sacral Pain: improving Quality: yes Severity: mild Redness: no Swelling: no Oozing: no Pus: no Fevers: no Nausea/vomiting:  no Status: stable Treatments attempted: wound vac and home health   ANXIETY/STRESS Following with psychiatry at this time, but wanting to see therapy too.  Continues Celexa 20 MG and Buspar 15 MG TID + Trazodone at night.  Continues to have major issues with PTSD after 4 months in hospital due to some of the treatment in hospital with nursing not changing pads when wet or placing towel between her legs instead of cleaning her + overall poor discharge experience. Duration: stable Anxious mood: yes  Excessive worrying: yes Irritability: yes  Sweating: no Nausea: no Palpitations:no Hyperventilation: no Panic attacks: yes Agoraphobia: no  Obscessions/compulsions: no Depressed mood: yes    02/20/2022    2:28 PM 01/11/2022   11:17 AM 12/14/2021   10:19 AM 11/16/2021   11:01 AM 04/28/2021    4:31 PM  Depression screen PHQ 2/9  Decreased Interest 2 3 1 1  0  Down, Depressed, Hopeless 2 3 1 1  0  PHQ - 2 Score 4 6 2 2  0  Altered sleeping 0 2 0 1   Tired, decreased energy 2 3 1 3    Change in appetite 0 0 0 1   Feeling bad or failure about yourself  1 0 0 2   Trouble concentrating 1 0 1 1   Moving slowly or fidgety/restless 2 3 1 2    Suicidal thoughts 0 0 0 0   PHQ-9 Score 10 14 5 12    Difficult doing work/chores Somewhat difficult Extremely dIfficult  02/20/2022    2:33 PM 01/11/2022   11:20 AM 12/14/2021   10:19 AM 11/16/2021   11:03 AM  GAD 7 : Generalized Anxiety Score  Nervous, Anxious, on Edge 2 3 1 2   Control/stop worrying 1 3 1 1   Worry too much - different things 1 3 1 2   Trouble relaxing 2 3 1 2   Restless 2 3 0 2  Easily annoyed or irritable 1 3 1 2   Afraid - awful might happen 2 3 1 2   Total GAD 7 Score 11 21 6 13   Anxiety Difficulty Somewhat difficult Very difficult Extremely difficult Somewhat difficult    Relevant past medical, surgical, family and social history reviewed and updated as indicated. Interim medical history since our last visit reviewed. Allergies  and medications reviewed and updated.  Review of Systems  Constitutional:  Negative for activity change, appetite change, diaphoresis, fatigue and fever.  Respiratory:  Negative for cough, chest tightness, shortness of breath and wheezing.   Cardiovascular:  Negative for chest pain, palpitations and leg swelling.  Gastrointestinal: Negative.   Endocrine: Negative for cold intolerance and heat intolerance.  Neurological: Negative.   Psychiatric/Behavioral:  Positive for sleep disturbance. Negative for decreased concentration, self-injury and suicidal ideas. The patient is nervous/anxious.     Per HPI unless specifically indicated above     Objective:    BP 105/64   Pulse (!) 55   Temp 98.3 F (36.8 C) (Oral)   Ht 5\' 7"  (1.702 m)   Wt 133 lb 6.4 oz (60.5 kg)   SpO2 98%   BMI 20.89 kg/m   Wt Readings from Last 3 Encounters:  02/20/22 133 lb 6.4 oz (60.5 kg)  01/11/22 122 lb (55.3 kg)  12/14/21 128 lb 12.8 oz (58.4 kg)    Physical Exam Vitals and nursing note reviewed.  Constitutional:      General: She is awake. She is not in acute distress.    Appearance: She is well-developed and well-groomed. She is not ill-appearing or toxic-appearing.  HENT:     Head: Normocephalic.     Right Ear: Hearing normal.     Left Ear: Hearing normal.  Eyes:     General: Lids are normal.        Right eye: No discharge.        Left eye: No discharge.     Conjunctiva/sclera: Conjunctivae normal.     Pupils: Pupils are equal, round, and reactive to light.  Neck:     Thyroid: No thyromegaly.     Vascular: No carotid bruit.  Cardiovascular:     Rate and Rhythm: Normal rate and regular rhythm.     Heart sounds: Normal heart sounds. No murmur heard.    No gallop.  Pulmonary:     Effort: Pulmonary effort is normal. No accessory muscle usage or respiratory distress.     Breath sounds: Normal breath sounds.  Abdominal:     General: Bowel sounds are normal.     Palpations: Abdomen is soft.   Musculoskeletal:     Cervical back: Normal range of motion and neck supple.     Right lower leg: No edema.     Left lower leg: No edema.  Skin:    General: Skin is warm and dry.  Neurological:     Mental Status: She is alert and oriented to person, place, and time.  Psychiatric:        Attention and Perception: Attention normal.  Mood and Affect: Mood normal.        Speech: Speech normal.        Behavior: Behavior normal. Behavior is cooperative.        Thought Content: Thought content normal.    Results for orders placed or performed in visit on 02/15/22  Ferritin  Result Value Ref Range   Ferritin 985 (H) 15 - 150 ng/mL  Folate  Result Value Ref Range   Folate >20.0 >3.0 ng/mL  Iron Binding Cap (TIBC)([facility])  Result Value Ref Range   Total Iron Binding Capacity 274 250 - 450 ug/dL   UIBC 189 118 - 369 ug/dL   Iron 85 27 - 139 ug/dL   Iron Saturation 31 15 - 55 %  PTH, intact and calcium  Result Value Ref Range   PTH 21 15 - 65 pg/mL   PTH Interp Comment   Vitamin B12  Result Value Ref Range   Vitamin B-12 995 232 - 1,245 pg/mL  CBC with Differential/Platelet  Result Value Ref Range   WBC 3.8 3.4 - 10.8 x10E3/uL   RBC 3.77 3.77 - 5.28 x10E6/uL   Hemoglobin 11.5 11.1 - 15.9 g/dL   Hematocrit 33.9 (L) 34.0 - 46.6 %   MCV 90 79 - 97 fL   MCH 30.5 26.6 - 33.0 pg   MCHC 33.9 31.5 - 35.7 g/dL   RDW 14.4 11.7 - 15.4 %   Platelets 168 150 - 450 x10E3/uL   Neutrophils 56 Not Estab. %   Lymphs 36 Not Estab. %   Monocytes 6 Not Estab. %   Eos 1 Not Estab. %   Basos 1 Not Estab. %   Neutrophils Absolute 2.2 1.4 - 7.0 x10E3/uL   Lymphocytes Absolute 1.3 0.7 - 3.1 x10E3/uL   Monocytes Absolute 0.2 0.1 - 0.9 x10E3/uL   EOS (ABSOLUTE) 0.0 0.0 - 0.4 x10E3/uL   Basophils Absolute 0.0 0.0 - 0.2 x10E3/uL   Immature Granulocytes 0 Not Estab. %   Immature Grans (Abs) 0.0 0.0 - 0.1 x10E3/uL  Comprehensive metabolic panel  Result Value Ref Range   Glucose 71  70 - 99 mg/dL   BUN 17 8 - 27 mg/dL   Creatinine, Ser 0.57 0.57 - 1.00 mg/dL   eGFR 98 >59 mL/min/1.73   BUN/Creatinine Ratio 30 (H) 12 - 28   Sodium 144 134 - 144 mmol/L   Potassium 3.6 3.5 - 5.2 mmol/L   Chloride 105 96 - 106 mmol/L   CO2 27 20 - 29 mmol/L   Calcium 9.8 8.7 - 10.3 mg/dL   Total Protein 6.9 6.0 - 8.5 g/dL   Albumin 4.4 3.8 - 4.8 g/dL   Globulin, Total 2.5 1.5 - 4.5 g/dL   Albumin/Globulin Ratio 1.8 1.2 - 2.2   Bilirubin Total 0.4 0.0 - 1.2 mg/dL   Alkaline Phosphatase 102 44 - 121 IU/L   AST 25 0 - 40 IU/L   ALT 29 0 - 32 IU/L  TSH  Result Value Ref Range   TSH 3.590 0.450 - 4.500 uIU/mL  T4, free  Result Value Ref Range   Free T4 1.00 0.82 - 1.77 ng/dL      Assessment & Plan:   Problem List Items Addressed This Visit       Endocrine   Hashimoto's thyroiditis    Chronic, ongoing.  Recent thyroid labs euthyroid.  Continue current medication regimen and adjust as needed.  She is gaining weight and overall improving.  Does request referral to specialist for  her ongoing random episodes of shaking and crying, suspect more PTSD related, but will place referral to endo per request.  Thyroid ultrasound upcoming.      Relevant Orders   Ambulatory referral to Endocrinology     Nervous and Auditory   Neuropathy    Post hospitalization with septic shock, worse at night.  Labs up to date.  Will increase Gabapentin to 300 MG morning and noon, then 600 MG at night for discomfort. Recheck labs next visit.        Musculoskeletal and Integument   Skin ulcer of sacrum with fat layer exposed ([place])    Improving per patient report.  Continue collaboration with home health who is caring for wound, currently wound vac is off, and collaboration with [facility] surgical team.        Other   Anxiety    Chronic, ongoing with PTSD aspect present from lengthy hospitalization with septic shock.  Denies SI/HI.  Continue Celexa 20 MG daily and Buspar 15 MG TID + Trazodone.  Continue  palliative collaboration + psychiatry.  Referral to therapy placed as would benefit from extensive CBT with underlying PTSD symptoms present.      Relevant Orders   Ambulatory referral to Psychology   History of septic shock    Stabilizing at this time and improved from recent visit, with weight gain now presenting.  Have recommended continue supplements at home 3-4 times a day.  Continue to collaborate with psychiatry + referral placed for therapy, would benefit extensive CBT.  Continue collaboration with palliative.      Moderate protein-calorie malnutrition ([place]) - Primary    Is beginning to gain, has gained 11 pounds since recent visit and thyroid is stabilizing. Loss related to 4 month hospitalization and deconditioning.  Recommend continue lactose free supplements 3-4 times daily, as Boost caused nausea.  Ensure eating frequent, small, healthy meals.  Labs next visit.      Severe muscle deconditioning    Refer to protein calorie malnutrition plan of care.        Time: 25 minutes, >50% spent counseling/or care coordination   Follow up plan: Return in about 6 weeks (around 04/03/2022) for THYROID, WEIGHT, WOUND.

## 2022-02-20 NOTE — Assessment & Plan Note (Signed)
Improving per patient report.  Continue collaboration with home health who is caring for wound, currently wound vac is off, and collaboration with [facility] surgical team.

## 2022-02-20 NOTE — Assessment & Plan Note (Signed)
Refer to protein calorie malnutrition plan of care.

## 2022-02-20 NOTE — Assessment & Plan Note (Signed)
Chronic, ongoing.  Recent thyroid labs euthyroid.  Continue current medication regimen and adjust as needed.  She is gaining weight and overall improving.  Does request referral to specialist for her ongoing random episodes of shaking and crying, suspect more PTSD related, but will place referral to endo per request.  Thyroid ultrasound upcoming.

## 2022-02-23 ENCOUNTER — Ambulatory Visit
Admission: RE | Admit: 2022-02-23 | Discharge: 2022-02-23 | Disposition: A | Discharge status: Home / Self Care | Payer: Medicare Other | Source: Ambulatory Visit | Attending: Nurse Practitioner | Admitting: Nurse Practitioner

## 2022-02-23 DIAGNOSIS — E063 Autoimmune thyroiditis: Secondary | ICD-10-CM | POA: Diagnosis present

## 2022-02-24 NOTE — Progress Notes (Signed)
Contacted via MyChart   Good evening [name] Robinson, your thyroid ultrasound has returned and overall it is stable with no nodules noted.  Thyroid is enlarged, which is consistent with your Hashimoto's.  Continue current Levothyroxine dosing as labs were stable and you are overall looking much better.  Any questions? Keep being amazing!!  Thank you for allowing me to participate in your care.  I appreciate you. Kindest regards, Jassen Sarver

## 2022-03-02 ENCOUNTER — Telehealth: Payer: Self-pay

## 2022-03-02 NOTE — Telephone Encounter (Signed)
Wonderful. Noted

## 2022-03-02 NOTE — Telephone Encounter (Signed)
Copied from [place] [id number]. Topic: General - Other >> Mar 02, 2022  1:14 PM Everette C wrote: Reason for CRM: Anissa with [facility] has called to share the the patients wound has healed and the patient will be discharged from care on 03/06/22  Please contact further if needed

## 2022-03-03 ENCOUNTER — Telehealth: Payer: Self-pay | Admitting: Nurse Practitioner

## 2022-03-03 NOTE — Telephone Encounter (Signed)
Copied from [place] [id number]. Topic: Referral - Status >> Mar 03, 2022  9:29 AM Everette C wrote: Reason for CRM: [name] Robinson has called to share that the patient has an appointment with Raoul Pitch 04/11/22 at 7 PM   Please contact further if needed

## 2022-03-07 ENCOUNTER — Ambulatory Visit (INDEPENDENT_AMBULATORY_CARE_PROVIDER_SITE_OTHER): Payer: Medicare Other | Admitting: Nurse Practitioner

## 2022-03-07 ENCOUNTER — Encounter: Payer: Self-pay | Admitting: Nurse Practitioner

## 2022-03-07 DIAGNOSIS — R059 Cough, unspecified: Secondary | ICD-10-CM | POA: Insufficient documentation

## 2022-03-07 DIAGNOSIS — R051 Acute cough: Secondary | ICD-10-CM | POA: Diagnosis not present

## 2022-03-07 MED ORDER — AMOXICILLIN-POT CLAVULANATE 875-125 MG PO TABS: 1.0000 | ORAL_TABLET | Freq: Two times a day (BID) | ORAL | 0 refills | Status: AC

## 2022-03-07 MED ORDER — LIDOCAINE VISCOUS HCL 2 % MT SOLN: 15.0000 mL | OROMUCOSAL | 0 refills | Status: DC | PRN

## 2022-03-07 MED ORDER — PREDNISONE 20 MG PO TABS: 40.0000 mg | ORAL_TABLET | Freq: Every day | ORAL | 0 refills | Status: AC

## 2022-03-07 NOTE — Patient Instructions (Signed)

## 2022-03-07 NOTE — Assessment & Plan Note (Signed)
Ongoing for 5 days, is higher risk as still recovering from recent acute hospitalization months back.  At this time will start Augmentin BID for 7 days and Prednisone 40 MG daily for 5 days burst + viscous lidocaine.  Recommend: - Increased rest - Increasing Fluids - Acetaminophen as needed for fever/pain.  - Salt water gargling, chloraseptic spray and throat lozenges - Mucinex.  - Humidifying the air. For worsening or ongoing return to office.

## 2022-03-07 NOTE — Progress Notes (Signed)
BP 126/69   Pulse 65   Temp 98.7 F (37.1 C) (Oral)   Wt 132 lb 6.4 oz (60.1 kg)   SpO2 96%   BMI 20.74 kg/m    Subjective:    Patient ID: [name] Robinson, female    DOB: 1950/11/09, 71 y.o.   MRN: [id number]  HPI: [name] Robinson is a 71 y.o. female  Chief Complaint  Patient presents with   Sore Throat   Cough    Patient says she has been coughing up phlegm with discoloration. Patient says she started with symptoms on Sunday. Patient denies taking any medication. Patient says she has gnarled with salt water. Patient says her throat feels like sandpaper. Patient denies having any fever/chills or other symptoms.    UPPER RESPIRATORY TRACT INFECTION Started with symptoms about 5 days ago, sore throat and coughing. Worst symptom: cough Fever: no Cough: yes Shortness of breath: no Wheezing: no Chest pain: no Chest tightness: no Chest congestion: no Nasal congestion: yes Runny nose: yes Post nasal drip: yes Sneezing: no Sore throat: yes Swollen glands: no Sinus pressure: no Headache: no Face pain: no Toothache: no Ear pain: none Ear pressure: none Eyes red/itching:no Eye drainage/crusting: no  Vomiting: no Rash: no Fatigue: yes Sick contacts: no -- was at church Strep contacts: no  Context: fluctuating Recurrent sinusitis: no Relief with OTC cold/cough medications: no  Treatments attempted: none    Relevant past medical, surgical, family and social history reviewed and updated as indicated. Interim medical history since our last visit reviewed. Allergies and medications reviewed and updated.  Review of Systems  Constitutional:  Positive for fatigue. Negative for activity change, appetite change, chills and fever.  HENT:  Positive for congestion, postnasal drip, rhinorrhea and sore throat. Negative for ear discharge, ear pain, facial swelling, sinus pressure, sinus pain, sneezing and voice change.   Eyes:  Negative for pain and visual disturbance.   Respiratory:  Positive for cough. Negative for chest tightness, shortness of breath and wheezing.   Cardiovascular:  Negative for chest pain, palpitations and leg swelling.  Gastrointestinal: Negative.   Endocrine: Negative.   Musculoskeletal:  Negative for myalgias.  Neurological: Negative.   Psychiatric/Behavioral: Negative.      Per HPI unless specifically indicated above     Objective:    BP 126/69   Pulse 65   Temp 98.7 F (37.1 C) (Oral)   Wt 132 lb 6.4 oz (60.1 kg)   SpO2 96%   BMI 20.74 kg/m   Wt Readings from Last 3 Encounters:  03/07/22 132 lb 6.4 oz (60.1 kg)  02/20/22 133 lb 6.4 oz (60.5 kg)  01/11/22 122 lb (55.3 kg)    Physical Exam Vitals and nursing note reviewed.  Constitutional:      General: She is awake. She is not in acute distress.    Appearance: She is well-developed and well-groomed. She is not ill-appearing or toxic-appearing.  HENT:     Head: Normocephalic.     Right Ear: Hearing, tympanic membrane, ear canal and external ear normal. No middle ear effusion.     Left Ear: Hearing, tympanic membrane, ear canal and external ear normal.  No middle ear effusion.     Nose: Rhinorrhea present. Rhinorrhea is clear.     Right Sinus: No maxillary sinus tenderness or frontal sinus tenderness.     Left Sinus: No maxillary sinus tenderness or frontal sinus tenderness.     Mouth/Throat:     Mouth: Mucous membranes are moist.  Pharynx: Posterior oropharyngeal erythema (mild with cobblestone appearance) present. No pharyngeal swelling or oropharyngeal exudate.  Eyes:     General: Lids are normal.        Right eye: No discharge.        Left eye: No discharge.     Conjunctiva/sclera: Conjunctivae normal.     Pupils: Pupils are equal, round, and reactive to light.  Neck:     Vascular: No carotid bruit.  Cardiovascular:     Rate and Rhythm: Normal rate and regular rhythm.     Heart sounds: Normal heart sounds. No murmur heard.    No gallop.  Pulmonary:      Effort: Pulmonary effort is normal. No accessory muscle usage or respiratory distress.     Breath sounds: Wheezing present. No decreased breath sounds or rhonchi.     Comments: Intermittent expiratory wheezes noted throughout, clear with cough. Abdominal:     General: Bowel sounds are normal.     Palpations: Abdomen is soft.  Musculoskeletal:     Cervical back: Normal range of motion and neck supple.     Right lower leg: No edema.     Left lower leg: No edema.  Lymphadenopathy:     Cervical: No cervical adenopathy.  Skin:    General: Skin is warm and dry.  Neurological:     Mental Status: She is alert and oriented to person, place, and time.  Psychiatric:        Attention and Perception: Attention normal.        Mood and Affect: Mood normal.        Speech: Speech normal.        Behavior: Behavior normal. Behavior is cooperative.        Thought Content: Thought content normal.    Results for orders placed or performed in visit on 02/15/22  Ferritin  Result Value Ref Range   Ferritin 985 (H) 15 - 150 ng/mL  Folate  Result Value Ref Range   Folate >20.0 >3.0 ng/mL  Iron Binding Cap (TIBC)([facility])  Result Value Ref Range   Total Iron Binding Capacity 274 250 - 450 ug/dL   UIBC 189 118 - 369 ug/dL   Iron 85 27 - 139 ug/dL   Iron Saturation 31 15 - 55 %  PTH, intact and calcium  Result Value Ref Range   PTH 21 15 - 65 pg/mL   PTH Interp Comment   Vitamin B12  Result Value Ref Range   Vitamin B-12 995 232 - 1,245 pg/mL  CBC with Differential/Platelet  Result Value Ref Range   WBC 3.8 3.4 - 10.8 x10E3/uL   RBC 3.77 3.77 - 5.28 x10E6/uL   Hemoglobin 11.5 11.1 - 15.9 g/dL   Hematocrit 33.9 (L) 34.0 - 46.6 %   MCV 90 79 - 97 fL   MCH 30.5 26.6 - 33.0 pg   MCHC 33.9 31.5 - 35.7 g/dL   RDW 14.4 11.7 - 15.4 %   Platelets 168 150 - 450 x10E3/uL   Neutrophils 56 Not Estab. %   Lymphs 36 Not Estab. %   Monocytes 6 Not Estab. %   Eos 1 Not Estab. %   Basos 1  Not Estab. %   Neutrophils Absolute 2.2 1.4 - 7.0 x10E3/uL   Lymphocytes Absolute 1.3 0.7 - 3.1 x10E3/uL   Monocytes Absolute 0.2 0.1 - 0.9 x10E3/uL   EOS (ABSOLUTE) 0.0 0.0 - 0.4 x10E3/uL   Basophils Absolute 0.0 0.0 - 0.2 x10E3/uL   Immature  Granulocytes 0 Not Estab. %   Immature Grans (Abs) 0.0 0.0 - 0.1 x10E3/uL  Comprehensive metabolic panel  Result Value Ref Range   Glucose 71 70 - 99 mg/dL   BUN 17 8 - 27 mg/dL   Creatinine, Ser 0.57 0.57 - 1.00 mg/dL   eGFR 98 >59 mL/min/1.73   BUN/Creatinine Ratio 30 (H) 12 - 28   Sodium 144 134 - 144 mmol/L   Potassium 3.6 3.5 - 5.2 mmol/L   Chloride 105 96 - 106 mmol/L   CO2 27 20 - 29 mmol/L   Calcium 9.8 8.7 - 10.3 mg/dL   Total Protein 6.9 6.0 - 8.5 g/dL   Albumin 4.4 3.8 - 4.8 g/dL   Globulin, Total 2.5 1.5 - 4.5 g/dL   Albumin/Globulin Ratio 1.8 1.2 - 2.2   Bilirubin Total 0.4 0.0 - 1.2 mg/dL   Alkaline Phosphatase 102 44 - 121 IU/L   AST 25 0 - 40 IU/L   ALT 29 0 - 32 IU/L  TSH  Result Value Ref Range   TSH 3.590 0.450 - 4.500 uIU/mL  T4, free  Result Value Ref Range   Free T4 1.00 0.82 - 1.77 ng/dL      Assessment & Plan:   Problem List Items Addressed This Visit       Other   Cough    Ongoing for 5 days, is higher risk as still recovering from recent acute hospitalization months back.  At this time will start Augmentin BID for 7 days and Prednisone 40 MG daily for 5 days burst + viscous lidocaine.  Recommend: - Increased rest - Increasing Fluids - Acetaminophen as needed for fever/pain.  - Salt water gargling, chloraseptic spray and throat lozenges - Mucinex.  - Humidifying the air. For worsening or ongoing return to office.        Follow up plan: Return if symptoms worsen or fail to improve.

## 2022-03-09 ENCOUNTER — Encounter: Payer: Medicare Other | Attending: Physician Assistant | Admitting: Internal Medicine

## 2022-03-09 DIAGNOSIS — R001 Bradycardia, unspecified: Secondary | ICD-10-CM | POA: Diagnosis not present

## 2022-03-09 DIAGNOSIS — G4730 Sleep apnea, unspecified: Secondary | ICD-10-CM | POA: Diagnosis not present

## 2022-03-09 DIAGNOSIS — L89153 Pressure ulcer of sacral region, stage 3: Secondary | ICD-10-CM | POA: Insufficient documentation

## 2022-03-09 NOTE — Progress Notes (Signed)
[name] Robinson, [name] Robinson ([id number]) Visit Report for 03/09/2022 HPI Details Patient Name: [name] Robinson, [name] Robinson Date of Service: 03/09/2022 1:45 PM Medical Record Number: [id number] Patient Account Number: [id number] Date of Birth/Sex: [birth year] (71 y.o. F) Treating RN: Cornell Barman Primary Care Provider: Marnee Guarneri Other Clinician: Massie Kluver Referring Provider: Marnee Guarneri Treating Provider/Extender: Tito Dine in Treatment: 5 History of Present Illness HPI Description: 01-27-2022 upon evaluation today patient appears for initial evaluation here in our clinic concerning issues she is having with a pressure area over the sacral region. Its a question of whether this is a stage III or stage IV pressure ulcer. The patient believes that it did go down to bone documentation in the paperwork says fat layer either way this is definitely at least a 3. I do see signs of actually good healing compared to the size it was in the past and the patient concurs with this. With that being said she was in septic shock and not turned significantly to offload appropriately as what the patient tells me occurred which led to the pressure ulcer. Subsequently she had a wound VAC on initially and this was discontinued somewhere around mid May by 01-11-2022 the note we have for review states the Wound VAC has been off for 2 weeks and the wound is improving. Patient does have a history of bradycardia as well as sleep apnea but no other major medical problems. 02-10-2022 upon evaluation today patient actually appears to be doing excellent in regard to her wound. She has been tolerating the dressing changes without complication. Fortunately there does not appear to be any signs of active infection at this time which is great news. No fevers, chills, nausea, vomiting, or diarrhea. 7/27; the patient is here for monthly follow-up. Her wound is totally epithelialized on the lower sacrum. The patient showed me a series  of pictures of her wound which apparently occurred in [facility] in the setting of a postoperative prolonged stay in the ICU. By the end of December this wound was clearly infected and necrotic. The patient says she almost "died" on 2 separate occasions. Miraculously she is recovered although she is walking with a walker. Electronic Signature(s) Signed: 03/09/2022 4:27:18 PM By: Linton Ham MD Entered By: Linton Ham on 03/09/2022 14:10:58 [name] Robinson ([id number]) -------------------------------------------------------------------------------- Physical Exam Details Patient Name: [name] Robinson Date of Service: 03/09/2022 1:45 PM Medical Record Number: [id number] Patient Account Number: [id number] Date of Birth/Sex: 1951-07-19 (71 y.o. F) Treating RN: Cornell Barman Primary Care Provider: Marnee Guarneri Other Clinician: Massie Kluver Referring Provider: Marnee Guarneri Treating Provider/Extender: Tito Dine in Treatment: 5 Constitutional Patient is hypotensive.However she appears well. Pulse regular and within target range for patient.Marland Kitchen Respirations regular, non-labored and within target range.. Temperature is normal and within the target range for the patient.Marland Kitchen appears in no distress. Notes Wound exam; the areas in the lower sacrum at the top part of the gluteal cleft. This is fully epithelialized however mostly what she has now is skin over bone there is no subcutaneous fat. No evidence of infection no tenderness Electronic Signature(s) Signed: 03/09/2022 4:27:18 PM By: Linton Ham MD Entered By: Linton Ham on 03/09/2022 14:12:10 [name] Robinson ([id number]) -------------------------------------------------------------------------------- Physician Orders Details Patient Name: [name] Robinson Date of Service: 03/09/2022 1:45 PM Medical Record Number: [id number] Patient Account Number: [id number] Date of Birth/Sex: 27-Mar-1951 (71 y.o. F) Treating RN: Cornell Barman Primary Care Provider: Marnee Guarneri Other Clinician: Massie Kluver Referring Provider: Marnee Guarneri Treating Provider/Extender: Tito Dine in  Treatment: 5 Verbal / Phone Orders: No Diagnosis Coding Discharge From [facility] Services o Discharge from [place] Treatment Complete - wound healed, call wound care center if any issues arise again Off-Loading o Turn and reposition every 2 hours o Other: - use gel cushion while sitting to remove pressure from sacral area Medications-Please add to medication list. o Other: - use a barrier cream to protect area such as: [employer] investment [employer]) Signed: 03/09/2022 4:20:19 PM By: Massie Kluver Signed: 03/09/2022 4:27:18 PM By: Linton Ham MD Entered By: Massie Kluver on 03/09/2022 13:56:36 [name] Robinson ([id number]) -------------------------------------------------------------------------------- Problem List Details Patient Name: [name] Robinson Date of Service: 03/09/2022 1:45 PM Medical Record Number: [id number] Patient Account Number: [id number] Date of Birth/Sex: 05-31-1951 (71 y.o. F) Treating RN: Cornell Barman Primary Care Provider: Marnee Guarneri Other Clinician: Massie Kluver Referring Provider: Marnee Guarneri Treating Provider/Extender: Tito Dine in Treatment: 5 Active Problems ICD-10 Encounter Code Description Active Date MDM Diagnosis L89.153 Pressure ulcer of sacral region, stage 3 01/27/2022 No Yes R00.1 Bradycardia, unspecified 01/27/2022 No Yes G47.30 Sleep apnea, unspecified 01/27/2022 No Yes Inactive Problems Resolved Problems Electronic Signature(s) Signed: 03/09/2022 4:27:18 PM By: Linton Ham MD Entered By: Linton Ham on 03/09/2022 14:09:11 [name] Robinson ([id number]) -------------------------------------------------------------------------------- Progress Note Details Patient Name: [name] Robinson Date of Service: 03/09/2022 1:45 PM Medical Record  Number: [id number] Patient Account Number: [id number] Date of Birth/Sex: Jan 04, 1951 (71 y.o. F) Treating RN: Cornell Barman Primary Care Provider: Marnee Guarneri Other Clinician: Massie Kluver Referring Provider: Marnee Guarneri Treating Provider/Extender: Tito Dine in Treatment: 5 Subjective History of Present Illness (HPI) 01-27-2022 upon evaluation today patient appears for initial evaluation here in our clinic concerning issues she is having with a pressure area over the sacral region. Its a question of whether this is a stage III or stage IV pressure ulcer. The patient believes that it did go down to bone documentation in the paperwork says fat layer either way this is definitely at least a 3. I do see signs of actually good healing compared to the size it was in the past and the patient concurs with this. With that being said she was in septic shock and not turned significantly to offload appropriately as what the patient tells me occurred which led to the pressure ulcer. Subsequently she had a wound VAC on initially and this was discontinued somewhere around mid May by 01-11-2022 the note we have for review states the Wound VAC has been off for 2 weeks and the wound is improving. Patient does have a history of bradycardia as well as sleep apnea but no other major medical problems. 02-10-2022 upon evaluation today patient actually appears to be doing excellent in regard to her wound. She has been tolerating the dressing changes without complication. Fortunately there does not appear to be any signs of active infection at this time which is great news. No fevers, chills, nausea, vomiting, or diarrhea. 7/27; the patient is here for monthly follow-up. Her wound is totally epithelialized on the lower sacrum. The patient showed me a series of pictures of her wound which apparently occurred in [facility] in the setting of a postoperative prolonged stay in the ICU. By the end  of December this wound was clearly infected and necrotic. The patient says she almost "died" on 2 separate occasions. Miraculously she is recovered although she is walking with a walker. Objective Constitutional Patient is hypotensive.However she appears well. Pulse regular and within target range for patient.Marland Kitchen Respirations regular, non-labored  and within target range.. Temperature is normal and within the target range for the patient.Marland Kitchen appears in no distress. Vitals Time Taken: 1:25 PM, Height: 67 in, Weight: 124 lbs, BMI: 19.4, Temperature: 98.3 F, Pulse: 65 bpm, Respiratory Rate: 18 breaths/min, Blood Pressure: 91/55 mmHg. General Notes: Patient states blood pressure is always low General Notes: Wound exam; the areas in the lower sacrum at the top part of the gluteal cleft. This is fully epithelialized however mostly what she has now is skin over bone there is no subcutaneous fat. No evidence of infection no tenderness Integumentary (Hair, Skin) Wound #1 status is Healed - Epithelialized. Original cause of wound was Pressure Injury. The date acquired was: 07/05/2021. The wound has been in treatment 5 weeks. The wound is located on the Medial Sacrum. The wound measures 0cm length x 0cm width x 0cm depth; 0cm^2 area and 0cm^3 volume. There is Fat Layer (Subcutaneous Tissue) exposed. There is a none present amount of drainage noted. There is large (67- 100%) pink granulation within the wound bed. There is no necrotic tissue within the wound bed. Assessment Active Problems ICD-10 Pressure ulcer of sacral region, stage 3 Bradycardia, unspecified Sleep apnea, unspecified [name] Robinson, [name] Robinson ([id number]) Plan Discharge From [facility] Services: Discharge from [place] Treatment Complete - wound healed, call wound care center if any issues arise again Off-Loading: Turn and reposition every 2 hours Other: - use gel cushion while sitting to remove pressure from sacral area Medications-Please  add to medication list.: Other: - use a barrier cream to protect area such as: Desitin 1. 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 By look of her pictures this became secondarily infected by late December. When she left the hospital her wound and cleaned up quite a bit her primary doctor ordered a wound VAC with dramatic improvement. She is also healed over reviewing the pictures from when she came here last month. 2. I have advised her to be careful if she ever requires hospitalization again. This area will not take direct pressure and would be at high risk of reopening. Electronic Signature(s) Signed: 03/09/2022 4:27:18 PM By: Linton Ham MD Entered By: Linton Ham on 03/09/2022 14:13:45 [name] Robinson ([id number]) -------------------------------------------------------------------------------- SuperBill Details Patient Name: [name] Robinson Date of Service: 03/09/2022 Medical Record Number: [id number] Patient Account Number: [id number] Date of Birth/Sex: 02/12/1951 (71 y.o. F) Treating RN: Cornell Barman Primary Care Provider: Marnee Guarneri Other Clinician: Massie Kluver Referring Provider: Marnee Guarneri Treating Provider/Extender: Tito Dine in Treatment: 5 Diagnosis Coding ICD-10 Codes Code Description L89.153 Pressure ulcer of sacral region, stage 3 R00.1 Bradycardia, unspecified G47.30 Sleep apnea, unspecified Facility Procedures CPT4 Code: [id number] Description: [phone] - WOUND CARE VISIT-LEV 2 EST PT Modifier: Quantity: 1 Physician Procedures CPT4 Code: [id number] Description: [id number] - WC PHYS LEVEL 2 - EST PT Modifier: Quantity: 1 CPT4 Code: Description: ICD-10 Diagnosis Description L89.153 Pressure ulcer of sacral region, stage 3 Modifier: Quantity: Electronic Signature(s) Signed: 03/09/2022 4:27:18 PM By: Linton Ham MD Entered By: Linton Ham on 03/09/2022 14:13:58

## 2022-03-10 NOTE — Progress Notes (Signed)
[name] Robinson, [name] Robinson ([id number]) Visit Report for 03/09/2022 Arrival Information Details Patient Name: [name] Robinson, [name] Robinson Date of Service: 03/09/2022 1:45 PM Medical Record Number: [id number] Patient Account Number: [id number] Date of Birth/Sex: 1951-05-15 (71 y.o. F) Treating RN: Cornell Barman Primary Care Hazelee Harbold: Marnee Guarneri Other Clinician: Massie Kluver Referring Olsen Mccutchan: Marnee Guarneri Treating Akshaya Toepfer/Extender: Tito Dine in Treatment: 5 Visit Information History Since Last Visit All ordered tests and consults were completed: No Patient Arrived: Gilford Rile Added or deleted any medications: No Arrival Time: 13:23 Any new allergies or adverse reactions: No Transfer Assistance: None Had a fall or experienced change in No activities of daily living that may affect risk of falls: Hospitalized since last visit: No Pain Present Now: No Electronic Signature(s) Signed: 03/09/2022 4:20:19 PM By: Massie Kluver Entered By: Massie Kluver on 03/09/2022 13:25:29 [name] Robinson ([id number]) -------------------------------------------------------------------------------- Clinic Level of Care Assessment Details Patient Name: [name] Robinson Date of Service: 03/09/2022 1:45 PM Medical Record Number: [id number] Patient Account Number: [id number] Date of Birth/Sex: 01-Mar-1951 (71 y.o. F) Treating RN: Cornell Barman Primary Care Colbert Curenton: Marnee Guarneri Other Clinician: Massie Kluver Referring Glendi Mohiuddin: Marnee Guarneri Treating Marquia Costello/Extender: Tito Dine in Treatment: 5 Clinic Level of Care Assessment Items TOOL 4 Quantity Score '[]'$  - Use when only an EandM is performed on FOLLOW-UP visit 0 ASSESSMENTS - Nursing Assessment / Reassessment X - Reassessment of Co-morbidities (includes updates in patient status) 1 10 X- 1 5 Reassessment of Adherence to Treatment Plan ASSESSMENTS - Wound and Skin Assessment / Reassessment X - Simple Wound Assessment / Reassessment - one  wound 1 5 '[]'$  - 0 Complex Wound Assessment / Reassessment - multiple wounds '[]'$  - 0 Dermatologic / Skin Assessment (not related to wound area) ASSESSMENTS - Focused Assessment '[]'$  - Circumferential Edema Measurements - multi extremities 0 '[]'$  - 0 Nutritional Assessment / Counseling / Intervention '[]'$  - 0 Lower Extremity Assessment (monofilament, tuning fork, pulses) '[]'$  - 0 Peripheral Arterial Disease Assessment (using hand held doppler) ASSESSMENTS - Ostomy and/or Continence Assessment and Care '[]'$  - Incontinence Assessment and Management 0 '[]'$  - 0 Ostomy Care Assessment and Management (repouching, etc.) PROCESS - Coordination of Care X - Simple Patient / Family Education for ongoing care 1 15 '[]'$  - 0 Complex (extensive) Patient / Family Education for ongoing care '[]'$  - 0 Staff obtains Programmer, systems, Records, Test Results / Process Orders '[]'$  - 0 Staff telephones HHA, Nursing Homes / Clarify orders / etc '[]'$  - 0 Routine Transfer to another Facility (non-emergent condition) '[]'$  - 0 Routine Hospital Admission (non-emergent condition) '[]'$  - 0 New Admissions / Biomedical engineer / Ordering NPWT, Apligraf, etc. '[]'$  - 0 Emergency Hospital Admission (emergent condition) X- 1 10 Simple Discharge Coordination '[]'$  - 0 Complex (extensive) Discharge Coordination PROCESS - Special Needs '[]'$  - Pediatric / Minor Patient Management 0 '[]'$  - 0 Isolation Patient Management '[]'$  - 0 Hearing / Language / Visual special needs '[]'$  - 0 Assessment of Community assistance (transportation, D/C planning, etc.) '[]'$  - 0 Additional assistance / Altered mentation '[]'$  - 0 Support Surface(s) Assessment (bed, cushion, seat, etc.) INTERVENTIONS - Wound Cleansing / Measurement Walls, Resa ([id number]) X- 1 5 Simple Wound Cleansing - one wound '[]'$  - 0 Complex Wound Cleansing - multiple wounds X- 1 5 Wound Imaging (photographs - any number of wounds) '[]'$  - 0 Wound Tracing (instead of photographs) '[]'$  - 0 Simple  Wound Measurement - one wound '[]'$  - 0 Complex Wound Measurement - multiple wounds INTERVENTIONS - Wound Dressings '[]'$  - Small Wound Dressing  one or multiple wounds 0 '[]'$  - 0 Medium Wound Dressing one or multiple wounds '[]'$  - 0 Large Wound Dressing one or multiple wounds '[]'$  - 0 Application of Medications - topical '[]'$  - 0 Application of Medications - injection INTERVENTIONS - Miscellaneous '[]'$  - External ear exam 0 '[]'$  - 0 Specimen Collection (cultures, biopsies, blood, body fluids, etc.) '[]'$  - 0 Specimen(s) / Culture(s) sent or taken to Lab for analysis '[]'$  - 0 Patient Transfer (multiple staff / Civil Service fast streamer / Similar devices) '[]'$  - 0 Simple Staple / Suture removal (25 or less) '[]'$  - 0 Complex Staple / Suture removal (26 or more) '[]'$  - 0 Hypo / Hyperglycemic Management (close monitor of Blood Glucose) '[]'$  - 0 Ankle / Brachial Index (ABI) - do not check if billed separately X- 1 5 Vital Signs Has the patient been seen at the hospital within the last three years: Yes Total Score: 60 Level Of Care: New/Established - Level 2 Electronic Signature(s) Signed: 03/09/2022 4:20:19 PM By: Massie Kluver Entered By: Massie Kluver on 03/09/2022 13:54:05 [name] Robinson ([id number]) -------------------------------------------------------------------------------- Encounter Discharge Information Details Patient Name: [name] Robinson Date of Service: 03/09/2022 1:45 PM Medical Record Number: [id number] Patient Account Number: [id number] Date of Birth/Sex: Feb 11, 1951 (71 y.o. F) Treating RN: Cornell Barman Primary Care Rasheen Schewe: Marnee Guarneri Other Clinician: Massie Kluver Referring Kayslee Furey: Marnee Guarneri Treating Robyne Matar/Extender: Tito Dine in Treatment: 5 Encounter Discharge Information Items Discharge Condition: Stable Ambulatory Status: Walker Discharge Destination: Home Transportation: Private Auto Accompanied By: husband Schedule Follow-up Appointment: Yes Clinical Summary  of Care: Electronic Signature(s) Signed: 03/09/2022 4:20:19 PM By: Massie Kluver Entered By: Massie Kluver on 03/09/2022 13:59:22 [name] Robinson ([id number]) -------------------------------------------------------------------------------- Lower Extremity Assessment Details Patient Name: [name] Robinson Date of Service: 03/09/2022 1:45 PM Medical Record Number: [id number] Patient Account Number: [id number] Date of Birth/Sex: 02-05-1951 (71 y.o. F) Treating RN: Cornell Barman Primary Care Kristy Schomburg: Marnee Guarneri Other Clinician: Massie Kluver Referring Homer Pfeifer: Marnee Guarneri Treating Neftaly Inzunza/Extender: Tito Dine in Treatment: 5 Electronic Signature(s) Signed: 03/09/2022 4:20:19 PM By: Massie Kluver Signed: 03/10/2022 3:58:02 PM By: Gretta Cool BSN, RN, CWS, Kim RN, BSN Entered By: Massie Kluver on 03/09/2022 13:50:17 [name] Robinson ([id number]) -------------------------------------------------------------------------------- Multi Wound Chart Details Patient Name: [name] Robinson Date of Service: 03/09/2022 1:45 PM Medical Record Number: [id number] Patient Account Number: [id number] Date of Birth/Sex: October 16, 1950 (71 y.o. F) Treating RN: Cornell Barman Primary Care Kian Gamarra: Marnee Guarneri Other Clinician: Massie Kluver Referring Xai Frerking: Marnee Guarneri Treating Cayne Yom/Extender: Tito Dine in Treatment: 5 Vital Signs Height(in): 67 Pulse(bpm): 65 Weight(lbs): 124 Blood Pressure(mmHg): 91/55 Body Mass Index(BMI): 19.4 Temperature(F): 98.3 Respiratory Rate(breaths/min): 18 Photos: [N/A:N/A] Wound Location: Medial Sacrum N/A N/A Wounding Event: Pressure Injury N/A N/A Primary Etiology: Pressure Ulcer N/A N/A Comorbid History: Anemia, Sleep Apnea, Arrhythmia, N/A N/A History of pressure wounds Date Acquired: 07/05/2021 N/A N/A Weeks of Treatment: 5 N/A N/A Wound Status: Open N/A N/A Wound Recurrence: No N/A N/A Measurements L x W x D (cm) 0.1x0.1x0.1  N/A N/A Area (cm) : 0.008 N/A N/A Volume (cm) : 0.001 N/A N/A % Reduction in Area: 98.50% N/A N/A % Reduction in Volume: 98.20% N/A N/A Classification: Category/Stage III N/A N/A Exudate Amount: None Present N/A N/A Granulation Amount: Large (67-100%) N/A N/A Granulation Quality: Pink N/A N/A Necrotic Amount: None Present (0%) N/A N/A Exposed Structures: Fat Layer (Subcutaneous Tissue): N/A N/A Yes Epithelialization: Large (67-100%) N/A N/A Treatment Notes Electronic Signature(s) Signed: 03/09/2022 4:20:19 PM By: Massie Kluver Entered By: Massie Kluver on 03/09/2022  13:36:01 [name] Robinson, [name] Robinson ([id number]) -------------------------------------------------------------------------------- Multi-Disciplinary Care Plan Details Patient Name: [name] Robinson, [name] Robinson Date of Service: 03/09/2022 1:45 PM Medical Record Number: [id number] Patient Account Number: [id number] Date of Birth/Sex: February 23, 1951 (71 y.o. F) Treating RN: Cornell Barman Primary Care Kyran Connaughton: Marnee Guarneri Other Clinician: Massie Kluver Referring Sakoya Win: Marnee Guarneri Treating Cooper Stamp/Extender: Tito Dine in Treatment: 5 Active Inactive Electronic Signature(s) Signed: 03/09/2022 4:20:19 PM By: Massie Kluver Signed: 03/10/2022 3:58:02 PM By: Gretta Cool, BSN, RN, CWS, Kim RN, BSN Entered By: Massie Kluver on 03/09/2022 13:50:38 [name] Robinson ([id number]) -------------------------------------------------------------------------------- Pain Assessment Details Patient Name: [name] Robinson Date of Service: 03/09/2022 1:45 PM Medical Record Number: [id number] Patient Account Number: [id number] Date of Birth/Sex: 01/03/1951 (71 y.o. F) Treating RN: Cornell Barman Primary Care Tameya Kuznia: Marnee Guarneri Other Clinician: Massie Kluver Referring Mellissa Conley: Marnee Guarneri Treating Zairah Arista/Extender: Tito Dine in Treatment: 5 Active Problems Location of Pain Severity and Description of Pain Patient Has Paino  No Site Locations Pain Management and Medication Current Pain Management: Electronic Signature(s) Signed: 03/09/2022 4:20:19 PM By: Massie Kluver Signed: 03/10/2022 3:58:02 PM By: Gretta Cool, BSN, RN, CWS, Kim RN, BSN Entered By: Massie Kluver on 03/09/2022 13:29:24 [name] Robinson ([id number]) -------------------------------------------------------------------------------- Patient/Caregiver Education Details Patient Name: [name] Robinson Date of Service: 03/09/2022 1:45 PM Medical Record Number: [id number] Patient Account Number: [id number] Date of Birth/Gender: [birth year] (71 y.o. F) Treating RN: Cornell Barman Primary Care Physician: Marnee Guarneri Other Clinician: Massie Kluver Referring Physician: Marnee Guarneri Treating Physician/Extender: Tito Dine in Treatment: 5 Education Assessment Education Provided To: Patient Education Topics Provided Wound/Skin Impairment: Handouts: Other: wounds are healed, call us if any further issues arise Electronic Signature(s) Signed: 03/09/2022 4:20:19 PM By: Massie Kluver Entered By: Massie Kluver on 03/09/2022 13:57:30 [name] Robinson ([id number]) -------------------------------------------------------------------------------- Wound Assessment Details Patient Name: [name] Robinson Date of Service: 03/09/2022 1:45 PM Medical Record Number: [id number] Patient Account Number: [id number] Date of Birth/Sex: July 30, 1951 (71 y.o. F) Treating RN: Cornell Barman Primary Care Steffani Dionisio: Marnee Guarneri Other Clinician: Massie Kluver Referring Alphonsus Doyel: Marnee Guarneri Treating Blakleigh Straw/Extender: Tito Dine in Treatment: 5 Wound Status Wound Number: 1 Primary Pressure Ulcer Etiology: Wound Location: Medial Sacrum Wound Status: Healed - Epithelialized Wounding Event: Pressure Injury Comorbid Anemia, Sleep Apnea, Arrhythmia, History of pressure Date Acquired: 07/05/2021 History: wounds Weeks Of Treatment: 5 Clustered Wound:  No Photos Wound Measurements Length: (cm) 0 % Width: (cm) 0 % Depth: (cm) 0 Ep Area: (cm) 0 Volume: (cm) 0 Reduction in Area: 100% Reduction in Volume: 100% ithelialization: Large (67-100%) Wound Description Classification: Category/Stage III F Exudate Amount: None Present S oul Odor After Cleansing: No lough/Fibrino No Wound Bed Granulation Amount: Large (67-100%) Exposed Structure Granulation Quality: Pink Fat Layer (Subcutaneous Tissue) Exposed: Yes Necrotic Amount: None Present (0%) Treatment Notes Wound #1 (Sacrum) Wound Laterality: Medial Cleanser Peri-Wound Care Topical Primary Dressing Secondary Dressing Secured With Compression Wrap [name] Robinson, [name] Robinson ([id number]) Compression Stockings Add-Ons Electronic Signature(s) Signed: 03/09/2022 4:20:19 PM By: Massie Kluver Signed: 03/10/2022 3:58:02 PM By: Gretta Cool, BSN, RN, CWS, Kim RN, BSN Entered By: Massie Kluver on 03/09/2022 13:44:38 [name] Robinson ([id number]) -------------------------------------------------------------------------------- [place] Details Patient Name: [name] Robinson Date of Service: 03/09/2022 1:45 PM Medical Record Number: [id number] Patient Account Number: [id number] Date of Birth/Sex: 09-19-1950 (71 y.o. F) Treating RN: Cornell Barman Primary Care Kongmeng Santoro: Marnee Guarneri Other Clinician: Massie Kluver Referring Nasir Bright: Marnee Guarneri Treating Rodriquez Thorner/Extender: Tito Dine in Treatment: 5 Vital Signs Time Taken: 13:25 Temperature (F): 98.3 Height (in): 67 Pulse (bpm): 65 Weight (lbs): 124 Respiratory Rate (  breaths/min): 18 Body Mass Index (BMI): 19.4 Blood Pressure (mmHg): 91/55 Reference Range: 80 - 120 mg / dl Notes Patient states blood pressure is always low Electronic Signature(s) Signed: 03/09/2022 4:20:19 PM By: Massie Kluver Entered By: Massie Kluver on 03/09/2022 13:29:06

## 2022-03-22 ENCOUNTER — Ambulatory Visit (INDEPENDENT_AMBULATORY_CARE_PROVIDER_SITE_OTHER): Payer: Medicare Other | Admitting: Nurse Practitioner

## 2022-03-22 ENCOUNTER — Encounter: Payer: Self-pay | Admitting: Nurse Practitioner

## 2022-03-22 VITALS — BP 118/61 | HR 60 | Ht 67.0 in | Wt 131.0 lb

## 2022-03-22 DIAGNOSIS — E063 Autoimmune thyroiditis: Secondary | ICD-10-CM

## 2022-03-22 DIAGNOSIS — E038 Other specified hypothyroidism: Secondary | ICD-10-CM

## 2022-03-22 NOTE — Progress Notes (Signed)
Endocrinology Consult Note                                         03/22/2022, 2:20 PM  Subjective:   Subjective    [name] Robinson is a 71 y.o.-year-old female patient being seen in consultation for hypothyroidism referred by [name] Lick, NP.   Past Medical History:  Diagnosis Date   Hypothyroidism    Morbid obesity ([place]) 11/2019   s/p [facility])   Sleep apnea     Past Surgical History:  Procedure Laterality Date   ABDOMINAL HYSTERECTOMY     BARIATRIC SURGERY N/A 01/02/2020   duodenal switch   CERVICAL DISC SURGERY N/A    discectomy   COLONOSCOPY     ESOPHAGOGASTRODUODENOSCOPY  11/06/2020   EYE SURGERY Bilateral    laser procedure   LAPAROSCOPIC COLON RESECTION     MASS EXCISION Left 04/13/2021   Procedure: EXCISION MASS, left lateral thigh;  Surgeon: Ronny Bacon, MD;  Location: [facility] ORS;  Service: General;  Laterality: Left;   SHOULDER SURGERY Left    torn cartilege   TONSILLECTOMY N/A    TOTAL HIP ARTHROPLASTY Right 11/02/2020   TRANSTHORACIC ECHOCARDIOGRAM  07/14/2019   a) [facility]) Technically difficult.-Used Definity.  Nl LV Fxn -EF 55 - 60%.  No obvious WMA.  Mild MR.  No effusion;; b) Echo 04/16/2021-[facility]: (Fair quality) normal LV size and function.  EF 55-60%.  No R WMA.  Normal diastolic parameters.  Mild MR.  Unable to assess RVSP.    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  This involves pretty much total care.   Otherwise, she is usually on the go, not sedentary.   Social Determinants of Health   Financial Resource Strain: Low Risk  (04/28/2021)   Overall Financial Resource Strain (CARDIA)    Difficulty of Paying Living Expenses: Not hard at all  Food Insecurity: No Food Insecurity (04/28/2021)   Hunger Vital Sign    Worried About Running Out of Food in the Last Year: Never true    Ran Out of Food in the Last Year: Never true  Transportation Needs: No Transportation Needs (04/28/2021)   PRAPARE - Hydrologist (Medical): No    Lack of Transportation (Non-Medical): No  Physical Activity: Sufficiently Active (04/28/2021)  Exercise Vital Sign    Days of Exercise per Week: 7 days    Minutes of Exercise per Session: 30 min  Stress: No Stress Concern Present (04/28/2021)   [place]    Feeling of Stress : Not at all  Social Connections: [place] (04/28/2021)   Social Connection and Isolation Panel [NHANES]    Frequency of Communication with Friends and Family: Once a week    Frequency of Social Gatherings with Friends and Family: Twice a week    Attends Religious Services: More than 4 times per year    Active Member of [employer] or Organizations: Yes    Attends Music therapist: More than 4 times per year    Marital Status: Married    Family History  Problem Relation Age of Onset   Thyroid disease Mother    Cancer - Lung Father    Cancer - Lung Brother    Cancer Brother     Outpatient Encounter Medications as of 03/22/2022  Medication Sig   Multiple Vitamin (MULTIVITAMIN) capsule Take 1 capsule by mouth daily.   busPIRone (BUSPAR) 15 MG tablet Take by mouth.   citalopram (CELEXA) 20 MG tablet Take 20 mg by mouth daily.   ergocalciferol (VITAMIN D2) 1.25 MG  (50000 UT) capsule Take 1 capsule (50,000 Units total) by mouth once a week.   gabapentin (NEURONTIN) 300 MG capsule Take one tablet (300 MG) by mouth in the morning and at noon, then 600 MG (two tablets) by mouth at night.   levothyroxine (SYNTHROID) 25 MCG tablet Take 1 tablet (25 mcg total) by mouth daily.   traZODone (DESYREL) 50 MG tablet Take 50-100 mg by mouth at bedtime as needed.   vitamin B-12 (CYANOCOBALAMIN) 1000 MCG tablet Take 1,000 mcg by mouth daily.   [DISCONTINUED] docusate sodium (COLACE) 100 MG capsule Take 100 mg by mouth 2 (two) times daily.   [DISCONTINUED] lidocaine (XYLOCAINE) 2 % solution Use as directed 15 mLs in the mouth or throat as needed for mouth pain.   No facility-administered encounter medications on file as of 03/22/2022.    ALLERGIES: Allergies  Allergen Reactions   Solifenacin Rash    Patient had bad rash with this med.   VACCINATION STATUS: Immunization History  Administered Date(s) Administered   Fluad Quad(high Dose 65+) 05/20/2021   Influenza Inj Mdck Quad Pf 05/29/2017   Influenza, High Dose Seasonal PF 05/17/2018, 05/07/2019   Influenza,inj,Quad PF,6+ Mos 06/18/2015, 05/19/2016, 05/07/2019   Influenza-Unspecified 05/29/2017, 06/16/2020   PFIZER(Purple Top)SARS-COV-2 Vaccination 10/11/2019, 11/05/2019   PPD Test 01/04/2021   Pneumococcal Conjugate-13 05/07/2019   Td 02/28/2021   Tdap 06/21/2003     HPI   [name] Robinson  is a patient with the above medical history. she was diagnosed with hypothyroidism years ago, which required subsequent initiation of thyroid hormone replacement. she was given various doses of Levothyroxine over the years, currently on 25 micrograms. she reports compliance to this medication:  Taking it daily on empty stomach  with water, separated by >30 minutes before breakfast and other medications , and by at least 4 hours from calcium, iron, PPIs, multivitamins.  She notes she was hospitalized for sepsis last year and  has since developed a constant tremor in which she has been trying to solve.  She weaned herself off Fentanyl and that did not help her tremors.  I reviewed patient's thyroid tests:  Lab Results  Component Value Date   TSH 3.590  02/15/2022   TSH 0.056 (L) 12/14/2021   TSH 0.237 (L) 11/28/2021   TSH 4.220 11/09/2021   TSH 1.250 02/28/2021   FREET4 1.00 02/15/2022   FREET4 1.84 (H) 12/14/2021   FREET4 1.24 (H) 11/28/2021   FREET4 1.08 02/28/2021     Pt describes: - weight loss - tremors  Pt denies feeling nodules in neck, hoarseness, dysphagia/odynophagia, SOB with lying down.  she denies family history of thyroid disorders.  No family history of thyroid cancer.  No history of radiation therapy to head or neck.  No recent use of iodine supplements.  Denies use of Biotin containing supplements.  I reviewed her chart and she also has a history of neuropathy, vitamin d deficiency, cardiomyopathy, pernicious anemia, bariatric surgery.   ROS:  Constitutional: + weight loss, no fatigue, no subjective hyperthermia, no subjective hypothermia Eyes: no blurry vision, no xerophthalmia ENT: no sore throat, no nodules palpated in throat, no dysphagia/odynophagia, no hoarseness Cardiovascular: no chest pain, no SOB, no palpitations, no leg swelling Respiratory: no cough, no SOB Gastrointestinal: no nausea/vomiting/diarrhea Musculoskeletal: no muscle/joint aches Skin: no rashes Neurological: + full body tremors, no numbness, no tingling, no dizziness Psychiatric: no depression, no anxiety   Objective:   Objective     BP 118/61   Pulse 60   Ht '5\' 7"'$  (1.702 m)   Wt 131 lb (59.4 kg)   BMI 20.52 kg/m  Wt Readings from Last 3 Encounters:  03/22/22 131 lb (59.4 kg)  03/07/22 132 lb 6.4 oz (60.1 kg)  02/20/22 133 lb 6.4 oz (60.5 kg)    BP Readings from Last 3 Encounters:  03/22/22 118/61  03/07/22 126/69  02/20/22 105/64     Constitutional:  Body mass index is 20.52 kg/m.,  not in acute distress, normal state of mind Eyes: PERRLA, EOMI, no exophthalmos ENT: moist mucous membranes, mild thyromegaly R>L, no cervical lymphadenopathy Cardiovascular: normal precordial activity, RRR, no murmur/rubs/gallops Respiratory:  adequate breathing efforts, no gross chest deformity, Clear to auscultation bilaterally Gastrointestinal: abdomen soft, non-tender, no distension, bowel sounds present Musculoskeletal: no gross deformities, strength intact in all four extremities Skin: moist, warm, no rashes Neurological: resting head tremor , no tremor with outstretched hands, deep tendon reflexes normal in BLE.   CMP ( most recent) CMP     Component Value Date/Time   NA 144 02/15/2022 1356   K 3.6 02/15/2022 1356   CL 105 02/15/2022 1356   CO2 27 02/15/2022 1356   GLUCOSE 71 02/15/2022 1356   GLUCOSE 156 (H) 06/10/2021 0615   BUN 17 02/15/2022 1356   CREATININE 0.57 02/15/2022 1356   CALCIUM 9.8 02/15/2022 1356   PROT 6.9 02/15/2022 1356   ALBUMIN 4.4 02/15/2022 1356   AST 25 02/15/2022 1356   ALT 29 02/15/2022 1356   ALKPHOS 102 02/15/2022 1356   BILITOT 0.4 02/15/2022 1356   GFRNONAA >60 06/10/2021 0615     Diabetic Labs (most recent): No results found for: "HGBA1C", "MICROALBUR"   Lipid Panel ( most recent) Lipid Panel     Component Value Date/Time   CHOL 115 02/28/2021 1103   TRIG 64 02/28/2021 1103   HDL 69 02/28/2021 1103   LDLCALC 32 02/28/2021 1103   LABVLDL 14 02/28/2021 1103       Lab Results  Component Value Date   TSH 3.590 02/15/2022   TSH 0.056 (L) 12/14/2021   TSH 0.237 (L) 11/28/2021   TSH 4.220 11/09/2021   TSH 1.250 02/28/2021   FREET4 1.00 02/15/2022  FREET4 1.84 (H) 12/14/2021   FREET4 1.24 (H) 11/28/2021   FREET4 1.08 02/28/2021     Thyroid US from 02/23/22 CLINICAL DATA:  Other.  Hashimoto's thyroiditis   EXAM: THYROID ULTRASOUND   TECHNIQUE: Ultrasound examination of the thyroid gland and adjacent soft tissues was  performed.   COMPARISON:  None Available.   FINDINGS: Parenchymal Echotexture: Markedly heterogenous   Isthmus: 0.2 cm   Right lobe: 5.4 x 3.1 x 2.6 cm   Left lobe: 5.8 x 3.2 x 2.4 cm   _________________________________________________________   Estimated total number of nodules >/= 1 cm: 0   Number of spongiform nodules >/=  2 cm not described below (TR1): 0   Number of mixed cystic and solid nodules >/= 1.5 cm not described below (TR2): 0   _________________________________________________________   No discrete nodules are seen within the thyroid gland.   IMPRESSION: 1. Diffusely enlarged and heterogeneous thyroid gland. The imaging appearance is consistent with the clinical history of Hashimoto's thyroiditis. 2. No discrete thyroid nodules are visualized.     Electronically Signed   By: Jacqulynn Cadet M.D.   On: 02/24/2022 16:12   Latest Reference Range & Units 02/28/21 11:03 11/09/21 17:05 11/28/21 16:19 12/14/21 11:13 02/15/22 13:56  TSH 0.450 - 4.500 uIU/mL 1.250 4.220 0.237 (L) 0.056 (L) 3.590  T4,Free(Direct) 0.82 - 1.77 ng/dL 1.08  1.24 (H) 1.84 (H) 1.00  Thyroperoxidase Ab SerPl-aCnc 0 - 34 IU/mL 197 (H)      (L): Data is abnormally low (H): Data is abnormally high  Assessment & Plan:   ASSESSMENT / PLAN:  1. Hypothyroidism-r/t Hashimotos thyroiditis.   Patient with long-standing hypothyroidism, on levothyroxine therapy. On physical exam , patient  does not  have  gross goiter, thyroid nodules, or neck compression symptoms.  She is advised to continue her current dose of Levothyroxine 25 mcg po daily before breakfast.   - We discussed about correct intake of levothyroxine, at fasting, with water, separated by at least 30 minutes from breakfast, and separated by more than 4 hours from calcium, iron, multivitamins, acid reflux medications (PPIs). -Patient is made aware of the fact that thyroid hormone replacement is needed for life, dose to be adjusted  by periodic monitoring of thyroid function tests.  - Will check thyroid tests before next visit: TSH, free T4, will also add on antibody testing to help classify her dysfunction as I cannot see any labs that confirm Hashimoto's.  -She recently had thyroid US on 7/13 which showed enlarged thyroid with heterogeneous tissue and no nodularity.  I do not feel that her tremors are related to her thyroid disease based on her latest labs from July.  She is advised to follow up with her PCP regarding this as she may need referral to Neurology in the future.  - Time spent with the patient: 60 minutes, of which >50% was spent in obtaining information about her symptoms, reviewing her previous labs, evaluations, and treatments, counseling her about her hypothyroidism, and developing a plan to confirm the diagnosis and long term treatment as necessary. Please refer to "Patient Self Inventory" in the Media tab for reviewed elements of pertinent patient history.  [name] participated in the discussions, expressed understanding, and voiced agreement with the above plans.  All questions were answered to her satisfaction. she is encouraged to contact clinic should she have any questions or concerns prior to her return visit.   FOLLOW UP PLAN:  Return in about 3 weeks (around 04/12/2022) for Thyroid  follow up, Previsit labs, Virtual visit ok.  Rayetta Pigg, [facility] [address] [place], [place] [address] Phone: [phone] Fax: [phone]  03/22/2022, 2:20 PM

## 2022-03-22 NOTE — Patient Instructions (Signed)

## 2022-04-01 NOTE — Patient Instructions (Signed)

## 2022-04-03 ENCOUNTER — Ambulatory Visit (INDEPENDENT_AMBULATORY_CARE_PROVIDER_SITE_OTHER): Payer: Medicare Other | Admitting: Nurse Practitioner

## 2022-04-03 ENCOUNTER — Encounter: Payer: Self-pay | Admitting: Nurse Practitioner

## 2022-04-03 ENCOUNTER — Other Ambulatory Visit: Payer: Medicare Other | Admitting: Primary Care

## 2022-04-03 VITALS — BP 107/66 | HR 58 | Temp 97.6°F | Ht 67.0 in | Wt 137.5 lb

## 2022-04-03 DIAGNOSIS — R251 Tremor, unspecified: Secondary | ICD-10-CM | POA: Insufficient documentation

## 2022-04-03 DIAGNOSIS — G629 Polyneuropathy, unspecified: Secondary | ICD-10-CM

## 2022-04-03 DIAGNOSIS — Z872 Personal history of diseases of the skin and subcutaneous tissue: Secondary | ICD-10-CM

## 2022-04-03 DIAGNOSIS — E440 Moderate protein-calorie malnutrition: Secondary | ICD-10-CM

## 2022-04-03 DIAGNOSIS — F419 Anxiety disorder, unspecified: Secondary | ICD-10-CM

## 2022-04-03 DIAGNOSIS — E063 Autoimmune thyroiditis: Secondary | ICD-10-CM

## 2022-04-03 DIAGNOSIS — Z515 Encounter for palliative care: Secondary | ICD-10-CM

## 2022-04-03 DIAGNOSIS — R001 Bradycardia, unspecified: Secondary | ICD-10-CM

## 2022-04-03 NOTE — Assessment & Plan Note (Signed)
Is continuing to gain, has gained 6 pounds since recent visit and thyroid is stabilizing. Loss related to 4 month hospitalization and deconditioning.  Recommend continue lactose free supplements 3-4 times daily, as Boost caused nausea.  Ensure eating frequent, small, healthy meals.  Labs next visit.

## 2022-04-03 NOTE — Assessment & Plan Note (Signed)
Chronic, improving with PTSD aspect present from lengthy hospitalization with septic shock.  Denies SI/HI.  Continue Celexa 20 MG daily and Buspar 15 MG TID + Trazodone.  Continue palliative collaboration + psychiatry as needed.  Scheduled upcoming for therapy as would benefit from extensive CBT with underlying PTSD symptoms present.

## 2022-04-03 NOTE — Progress Notes (Signed)
[facility] Consult Note Telephone: [phone]  Fax: [phone]    Date of encounter: 04/03/22 11:17 AM PATIENT NAME: [name] Robinson [address] [place] [address]   [id number] (home)  DOB: Sep 03, 1950 MRN: [id number] PRIMARY CARE PROVIDER:    Venita Lick, NP,  [place] [address] [phone]  REFERRING PROVIDER:   Venita Lick, NP [address] [place],  [place] [address] [phone]  RESPONSIBLE PARTY:    Contact Information     Name Relation Home Work Mobile   Lounette, Sloan Spouse   [phone]   Tearsa Kowalewski,Jennifer Daughter   [phone]       I met face to face with patient and family in home. Palliative Care was asked to follow this patient by consultation request of  Venita Lick, NP to address advance care planning and complex medical decision making. This is a follow up visit.                                   ASSESSMENT AND PLAN / RECOMMENDATIONS:   Advance Care Planning/Goals of Care: Goals include to maximize quality of life and symptom management. Patient/health care surrogate gave his/her permission to discuss.Our advance care planning conversation included a discussion about:     Exploration of personal, cultural or spiritual beliefs that might influence medical decisions  Exploration of goals of care in the event of a sudden injury or illness  Identification of a healthcare agent - husband   CODE STATUS: FULL  Symptom Management/Plan:  Mobility: using cane, walker . Improved, is starting to drive again.  Nutrition: improving, Weights at 135 lbs. Per report.  Thyroid Saw an endocrine MD, will follow until stable but this is improving Wound: Discussed wound recovery and is using topical for protection, with miconazole. I asked her to stop if not yeast infected, and use zinc ointment.  Mood: Psychiatry: has meds stabilized,  has f/u with  counselor. Is happy with med  management at this time. Peripheral neuropathy: Endorses +, taking gabapentin.   Follow up Palliative Care Visit: Palliative care will continue to follow for complex medical decision making, advance care planning, and clarification of goals. Return 12 weeks or prn.  This visit was coded based on medical decision making (MDM).  PPS: 60%  HOSPICE ELIGIBILITY/DIAGNOSIS: no  Chief Complaint: debility  HISTORY OF PRESENT ILLNESS:  [name] Robinson is a 71 y.o. year old female  with debility,  . Patient seen today to review palliative care needs to include medical decision making and advance care planning as appropriate.   History obtained from review of EMR, discussion with primary team, and interview with family, facility staff/caregiver and/or Ms. Koon.  I reviewed available labs, medications, imaging, studies and related documents from the EMR.  Records reviewed and summarized above.   ROS   General: NAD ENMT: denies dysphagia Cardiovascular: denies chest pain, denies DOE Pulmonary: denies cough, denies increased SOB Abdomen: endorses good appetite, denies constipation, endorses continence of bowel GU: denies dysuria, endorses continence of urine MSK:  denies  increased weakness,  no falls reported Skin: denies rashes or wounds Neurological: endorse neuropathic pain in LE, denies insomnia Psych: Endorses positive mood (improving)  Physical Exam: Current and past weights: 135 lbs Constitutional: NAD General: frail appearing, thin EYES: anicteric sclera, lids intact, no discharge  ENMT: intact hearing, oral mucous membranes moist, dentition intact CV:  [facility] Consult Note Telephone: [phone]  Fax: [phone]    Date of encounter: 04/03/22 11:17 AM PATIENT NAME: [name] Robinson [address] [place] [address]   [id number] (home)  DOB: Sep 03, 1950 MRN: [id number] PRIMARY CARE PROVIDER:    Venita Lick, NP,  [place] [address] [phone]  REFERRING PROVIDER:   Venita Lick, NP [address] [place],  [place] [address] [phone]  RESPONSIBLE PARTY:    Contact Information     Name Relation Home Work Mobile   Lounette, Sloan Spouse   [phone]   Tearsa Kowalewski,Jennifer Daughter   [phone]       I met face to face with patient and family in home. Palliative Care was asked to follow this patient by consultation request of  Venita Lick, NP to address advance care planning and complex medical decision making. This is a follow up visit.                                   ASSESSMENT AND PLAN / RECOMMENDATIONS:   Advance Care Planning/Goals of Care: Goals include to maximize quality of life and symptom management. Patient/health care surrogate gave his/her permission to discuss.Our advance care planning conversation included a discussion about:     Exploration of personal, cultural or spiritual beliefs that might influence medical decisions  Exploration of goals of care in the event of a sudden injury or illness  Identification of a healthcare agent - husband   CODE STATUS: FULL  Symptom Management/Plan:  Mobility: using cane, walker . Improved, is starting to drive again.  Nutrition: improving, Weights at 135 lbs. Per report.  Thyroid Saw an endocrine MD, will follow until stable but this is improving Wound: Discussed wound recovery and is using topical for protection, with miconazole. I asked her to stop if not yeast infected, and use zinc ointment.  Mood: Psychiatry: has meds stabilized,  has f/u with  counselor. Is happy with med  management at this time. Peripheral neuropathy: Endorses +, taking gabapentin.   Follow up Palliative Care Visit: Palliative care will continue to follow for complex medical decision making, advance care planning, and clarification of goals. Return 12 weeks or prn.  This visit was coded based on medical decision making (MDM).  PPS: 60%  HOSPICE ELIGIBILITY/DIAGNOSIS: no  Chief Complaint: debility  HISTORY OF PRESENT ILLNESS:  [name] Robinson is a 71 y.o. year old female  with debility,  . Patient seen today to review palliative care needs to include medical decision making and advance care planning as appropriate.   History obtained from review of EMR, discussion with primary team, and interview with family, facility staff/caregiver and/or Ms. Koon.  I reviewed available labs, medications, imaging, studies and related documents from the EMR.  Records reviewed and summarized above.   ROS   General: NAD ENMT: denies dysphagia Cardiovascular: denies chest pain, denies DOE Pulmonary: denies cough, denies increased SOB Abdomen: endorses good appetite, denies constipation, endorses continence of bowel GU: denies dysuria, endorses continence of urine MSK:  denies  increased weakness,  no falls reported Skin: denies rashes or wounds Neurological: endorse neuropathic pain in LE, denies insomnia Psych: Endorses positive mood (improving)  Physical Exam: Current and past weights: 135 lbs Constitutional: NAD General: frail appearing, thin EYES: anicteric sclera, lids intact, no discharge  ENMT: intact hearing, oral mucous membranes moist, dentition intact CV:

## 2022-04-03 NOTE — Assessment & Plan Note (Signed)
Followed by cardiology in past, will have her return as needed.  HR apical today 58 and overall no symptoms presenting.

## 2022-04-03 NOTE — Assessment & Plan Note (Signed)
Chronic, stable.  Recent thyroid labs euthyroid.  Continue current medication regimen and adjust as needed.  She is gaining weight and overall improving.  Thyroid ultrasound completed and overall reassuring.  Continue collaboration with endo.

## 2022-04-03 NOTE — Assessment & Plan Note (Signed)
Improved per patient report.  Continue collaboration with wound care as needed.

## 2022-04-03 NOTE — Assessment & Plan Note (Signed)
Ongoing, intermittent to body, upper more then lower since sepsis.  Anxiety has overall improved, tremor mildly remains intermittently.  Referral to neurology.

## 2022-04-03 NOTE — Progress Notes (Signed)
 BP 107/66   Pulse (!) 58 Comment: Apical  Temp 97.6 F (36.4 C) (Oral)   Ht 5' 7" (1.702 m)   Wt 137 lb 8 oz (62.4 kg)   SpO2 99%   BMI 21.54 kg/m    Subjective:    Patient ID: [name] Robinson, female    DOB: 03/16/1951, 71 y.o.   MRN: [id number]  HPI: [name] Robinson is a 71 y.o. female  Chief Complaint  Patient presents with   Hypothyroidism   Weight Check   Wound Check   Numbness    Patient says she is noticing some constant tingling and numbness that is radiating up from her left foot to the mid area of her leg. Patient says some days she can't even feel she has shoes on.    HYPOTHYROIDISM Currently to be taking 25 MCG Levothyroxine. Saw endocrinology 03/22/22 -- they are repeating labs in future.  Thyroid ultrasound performed 02/23/22, no nodules -- consistent with Hashimoto's.  Is now gaining weight and starting to feel better.  Up 6 pounds since last visit. Thyroid control status: stable Satisfied with current treatment? yes Medication side effects: no Medication compliance: good compliance Etiology of hypothyroidism: Hashimoto's Recent dose adjustment:yes Fatigue: no Cold intolerance: no Heat intolerance: no Weight gain: no Weight loss: improving now Constipation: improving -- 2 bowel movement a day Diarrhea/loose stools: no Palpitations: none Lower extremity edema: no Anxiety/depressed mood: yes   NEUROPATHY Continues to have neuropathy pain to both feet, this is improving at night.  Taking Gabapentin 300 MG in morning and afternoon + 600 MG at night. Neuropathy status: stable  Satisfied with current treatment?: yes Medication side effects: no Medication compliance:  good compliance Location: foot and lower legs Pain:  none -- more tingling from knee to foot Severity: mild  Quality:  tingling Frequency: constant Bilateral: yes Symmetric: yes Numbness: yes Decreased sensation: no Weakness: no Context: stable Alleviating factors:  Gabapentin Aggravating factors: unknown Treatments attempted: Gabapentin  WOUND & WEIGHT CHECK: Overall wound has improved, has healed over.  Wound clinic last seen 03/09/22.  Continues to have some discomfort with sitting to area due to loss of weight when in hospital -- using donut to sit.  Palliative continues to visit, last today.    Followed by Dr. Bovard at bariatric surgery at [facility], last visit 03/01/22, telemedicine. Hospitalized for 4 months at [facility] due to sepsis.  Saw Dr. Yu for anemia on 11/28/21, to return as needed. Duration: months Location: sacral Pain: improving Quality: yes Severity: mild Redness: no Swelling: no Oozing: no Pus: no Fevers: no Nausea/vomiting: no Status: improved Treatments attempted: wound vac and home health   ANXIETY/STRESS Saw psychiatry, cancelled recent appointment -- PCP to take over.  Therapist appointment on 29th.  Continues Celexa 20 MG and Buspar 15 MG TID + Trazodone at night.  Continues to have issues  with PTSD after 4 months in hospital due to some of the treatment in hospital with nursing not changing pads when wet or placing towel between her legs instead of cleaning + overall poor discharge experience. Duration: stable Anxious mood: improved Excessive worrying: improved Irritability: improved Sweating: no Nausea: no Palpitations:no Hyperventilation: no Panic attacks: improving Agoraphobia: no  Obscessions/compulsions: no Depressed mood: improved    04/03/2022    2:18 PM 02/20/2022    2:28 PM 01/11/2022   11:17 AM 12/14/2021   10:19 AM 11/16/2021   11:01 AM  Depression screen PHQ 2/9  Decreased Interest 1 2 3 1 1  Down,   Depressed, Hopeless _0 PHQ - 2 Score _1 Altered sleeping 0 0 2 0 1  Tired, decreased energy _2 Change in appetite 2 0 0 0 1  Feeling bad or failure about yourself  0 1 0 0 2  Trouble concentrating 1 1 0 1 1  Moving slowly or fidgety/restless _3 Suicidal thoughts 0 0 0 0 0   PHQ-9 Score _4 Difficult doing work/chores Somewhat difficult Somewhat difficult Extremely dIfficult         04/03/2022    2:19 PM 02/20/2022    2:33 PM 01/11/2022   11:20 AM 12/14/2021   10:19 AM  GAD 7 : Generalized Anxiety Score  Nervous, Anxious, on Edge _5 Control/stop worrying _6 Worry too much - different things _7 Trouble relaxing _8 Restless 0 2 3 0  Easily annoyed or irritable _9 Afraid - awful might happen _10 Total GAD 7 Score _11 Anxiety Difficulty Somewhat difficult Somewhat difficult Very difficult Extremely difficult    Relevant past medical, surgical, family and social history reviewed and updated as indicated. Interim medical history since our last visit reviewed. Allergies and medications reviewed and updated.  Review of Systems  Constitutional:  Negative for activity change, appetite change, diaphoresis, fatigue and fever.  Respiratory:  Negative for cough, chest tightness, shortness of breath and wheezing.   Cardiovascular:  Negative for chest pain, palpitations and leg swelling.  Gastrointestinal: Negative.   Endocrine: Negative for cold intolerance and heat intolerance.  Neurological: Negative.   Psychiatric/Behavioral: Negative.      Per HPI unless specifically indicated above     Objective:    BP 107/66   Pulse (!) 58 Comment: Apical  Temp 97.6 F (36.4 C) (Oral)   Ht 5' 7" (1.702 m)   Wt 137 lb 8 oz (62.4 kg)   SpO2 99%   BMI 21.54 kg/m   Wt Readings from Last 3 Encounters:  04/03/22 137 lb 8 oz (62.4 kg)  03/22/22 131 lb (59.4 kg)  03/07/22 132 lb 6.4 oz (60.1 kg)    Physical Exam Vitals and nursing note reviewed.  Constitutional:      General: She is awake. She is not in acute distress.    Appearance: She is well-developed and well-groomed. She is not ill-appearing or toxic-appearing.  HENT:     Head: Normocephalic.     Right Ear: Hearing normal.     Left Ear: Hearing normal.   Eyes:     General: Lids are normal.        Right eye: No discharge.        Left eye: No discharge.     Conjunctiva/sclera: Conjunctivae normal.     Pupils: Pupils are equal, round, and reactive to light.  Neck:     Thyroid: No thyromegaly.     Vascular: No carotid bruit.  Cardiovascular:     Rate and Rhythm: Regular rhythm. Bradycardia present.     Heart sounds: Normal heart sounds. No murmur heard.    No gallop.  Pulmonary:     Effort: Pulmonary effort is normal. No accessory muscle usage or respiratory distress.     Breath sounds: Normal breath sounds.  Abdominal:     General: Bowel  sounds are normal.     Palpations: Abdomen is soft.  Musculoskeletal:     Cervical back: Normal range of motion and neck supple.     Right lower leg: No edema.     Left lower leg: No edema.  Skin:    General: Skin is warm and dry.  Neurological:     Mental Status: She is alert and oriented to person, place, and time.  Psychiatric:        Attention and Perception: Attention normal.        Mood and Affect: Mood normal.        Speech: Speech normal.        Behavior: Behavior normal. Behavior is cooperative.        Thought Content: Thought content normal.    Results for orders placed or performed in visit on 02/15/22  Ferritin  Result Value Ref Range   Ferritin 985 (H) 15 - 150 ng/mL  Folate  Result Value Ref Range   Folate >20.0 >3.0 ng/mL  Iron Binding Cap (TIBC)([facility])  Result Value Ref Range   Total Iron Binding Capacity 274 250 - 450 ug/dL   UIBC 189 118 - 369 ug/dL   Iron 85 27 - 139 ug/dL   Iron Saturation 31 15 - 55 %  PTH, intact and calcium  Result Value Ref Range   PTH 21 15 - 65 pg/mL   PTH Interp Comment   Vitamin B12  Result Value Ref Range   Vitamin B-12 995 232 - 1,245 pg/mL  CBC with Differential/Platelet  Result Value Ref Range   WBC 3.8 3.4 - 10.8 x10E3/uL   RBC 3.77 3.77 - 5.28 x10E6/uL   Hemoglobin 11.5 11.1 - 15.9 g/dL   Hematocrit 33.9 (L) 34.0  - 46.6 %   MCV 90 79 - 97 fL   MCH 30.5 26.6 - 33.0 pg   MCHC 33.9 31.5 - 35.7 g/dL   RDW 14.4 11.7 - 15.4 %   Platelets 168 150 - 450 x10E3/uL   Neutrophils 56 Not Estab. %   Lymphs 36 Not Estab. %   Monocytes 6 Not Estab. %   Eos 1 Not Estab. %   Basos 1 Not Estab. %   Neutrophils Absolute 2.2 1.4 - 7.0 x10E3/uL   Lymphocytes Absolute 1.3 0.7 - 3.1 x10E3/uL   Monocytes Absolute 0.2 0.1 - 0.9 x10E3/uL   EOS (ABSOLUTE) 0.0 0.0 - 0.4 x10E3/uL   Basophils Absolute 0.0 0.0 - 0.2 x10E3/uL   Immature Granulocytes 0 Not Estab. %   Immature Grans (Abs) 0.0 0.0 - 0.1 x10E3/uL  Comprehensive metabolic panel  Result Value Ref Range   Glucose 71 70 - 99 mg/dL   BUN 17 8 - 27 mg/dL   Creatinine, Ser 0.57 0.57 - 1.00 mg/dL   eGFR 98 >59 mL/min/1.73   BUN/Creatinine Ratio 30 (H) 12 - 28   Sodium 144 134 - 144 mmol/L   Potassium 3.6 3.5 - 5.2 mmol/L   Chloride 105 96 - 106 mmol/L   CO2 27 20 - 29 mmol/L   Calcium 9.8 8.7 - 10.3 mg/dL   Total Protein 6.9 6.0 - 8.5 g/dL   Albumin 4.4 3.8 - 4.8 g/dL   Globulin, Total 2.5 1.5 - 4.5 g/dL   Albumin/Globulin Ratio 1.8 1.2 - 2.2   Bilirubin Total 0.4 0.0 - 1.2 mg/dL   Alkaline Phosphatase 102 44 - 121 IU/L   AST 25 0 - 40 IU/L   ALT 29 0 - 32   IU/L  TSH  Result Value Ref Range   TSH 3.590 0.450 - 4.500 uIU/mL  T4, free  Result Value Ref Range   Free T4 1.00 0.82 - 1.77 ng/dL      Assessment & Plan:   Problem List Items Addressed This Visit       Cardiovascular and Mediastinum   Bradycardia, sinus    Followed by cardiology in past, will have her return as needed.  HR apical today 58 and overall no symptoms presenting.        Endocrine   Hashimoto's thyroiditis    Chronic, stable.  Recent thyroid labs euthyroid.  Continue current medication regimen and adjust as needed.  She is gaining weight and overall improving.  Thyroid ultrasound completed and overall reassuring.  Continue collaboration with endo.        Nervous and Auditory    Neuropathy    Post hospitalization with septic shock, worse at night.  Labs up to date.  Will continue Gabapentin 300 MG morning and noon, then 600 MG at night for discomfort, this is offering her benefit -- adjust as needed based on symptoms and renal function. Recheck labs next visit.  Referral to neurology for further evaluation, as no issues with this prior to sepsis.  Obtain lumbar imaging.      Relevant Orders   DG Lumbar Spine Complete   Ambulatory referral to Neurology     Other   Anxiety    Chronic, improving with PTSD aspect present from lengthy hospitalization with septic shock.  Denies SI/HI.  Continue Celexa 20 MG daily and Buspar 15 MG TID + Trazodone.  Continue palliative collaboration + psychiatry as needed.  Scheduled upcoming for therapy as would benefit from extensive CBT with underlying PTSD symptoms present.      History of pressure ulcer    Improved per patient report.  Continue collaboration with wound care as needed.      Moderate protein-calorie malnutrition (HCC) - Primary    Is continuing to gain, has gained 6 pounds since recent visit and thyroid is stabilizing. Loss related to 4 month hospitalization and deconditioning.  Recommend continue lactose free supplements 3-4 times daily, as Boost caused nausea.  Ensure eating frequent, small, healthy meals.  Labs next visit.      Tremor    Ongoing, intermittent to body, upper more then lower since sepsis.  Anxiety has overall improved, tremor mildly remains intermittently.  Referral to neurology.      Relevant Orders   Ambulatory referral to Neurology    Follow up plan: Return in about 8 weeks (around 05/29/2022) for HYPOTHYROID, WEIGHT CHECK, NEUROPATHY.      

## 2022-04-03 NOTE — Assessment & Plan Note (Signed)
Post hospitalization with septic shock, worse at night.  Labs up to date.  Will continue Gabapentin 300 MG morning and noon, then 600 MG at night for discomfort, this is offering her benefit -- adjust as needed based on symptoms and renal function. Recheck labs next visit.  Referral to neurology for further evaluation, as no issues with this prior to sepsis.  Obtain lumbar imaging.

## 2022-04-07 ENCOUNTER — Telehealth: Payer: Self-pay | Admitting: Nurse Practitioner

## 2022-04-09 LAB — TSH: TSH: 12.5 u[IU]/mL — ABNORMAL HIGH (ref 0.450–4.500)

## 2022-04-09 LAB — T4, FREE: Free T4: 0.62 ng/dL — ABNORMAL LOW (ref 0.82–1.77)

## 2022-04-09 LAB — THYROGLOBULIN ANTIBODY: Thyroglobulin Antibody: 3.9 IU/mL — ABNORMAL HIGH (ref 0.0–0.9)

## 2022-04-09 LAB — T3, FREE: T3, Free: 1.5 pg/mL — ABNORMAL LOW (ref 2.0–4.4)

## 2022-04-09 LAB — THYROID PEROXIDASE ANTIBODY: Thyroperoxidase Ab SerPl-aCnc: 162 IU/mL — ABNORMAL HIGH (ref 0–34)

## 2022-04-10 ENCOUNTER — Other Ambulatory Visit: Admission: RE | Admit: 2022-04-10 | Payer: Medicare Other | Source: Home / Self Care

## 2022-04-10 ENCOUNTER — Ambulatory Visit
Admission: RE | Admit: 2022-04-10 | Discharge: 2022-04-10 | Disposition: A | Discharge status: Home / Self Care | Payer: Medicare Other | Attending: Nurse Practitioner | Admitting: Nurse Practitioner

## 2022-04-10 ENCOUNTER — Ambulatory Visit
Admission: RE | Admit: 2022-04-10 | Discharge: 2022-04-10 | Disposition: A | Discharge status: Home / Self Care | Payer: Medicare Other | Source: Ambulatory Visit | Attending: Nurse Practitioner | Admitting: Nurse Practitioner

## 2022-04-10 ENCOUNTER — Telehealth: Payer: Medicare Other | Admitting: Nurse Practitioner

## 2022-04-10 DIAGNOSIS — G629 Polyneuropathy, unspecified: Secondary | ICD-10-CM | POA: Insufficient documentation

## 2022-04-10 NOTE — Progress Notes (Signed)
Contacted via MyChart    Good evening [name] Robinson, your back imaging has returned and some mild arthritic changes were noted, not sure this is enough to cause the numbness you are experiencing.  I continue to suspect this is from recent lengthy hospitalization and recovery period as you had significant weight loss, which is currently seeing gain.  We will monitor and if ongoing numbness will consider a referral to neurology.  Any questions? Keep being amazing!!  Thank you for allowing me to participate in your care.  I appreciate you. Kindest regards, Tahje Borawski

## 2022-04-13 ENCOUNTER — Other Ambulatory Visit: Payer: Self-pay | Admitting: Nurse Practitioner

## 2022-04-13 DIAGNOSIS — Z1231 Encounter for screening mammogram for malignant neoplasm of breast: Secondary | ICD-10-CM

## 2022-04-20 ENCOUNTER — Ambulatory Visit (INDEPENDENT_AMBULATORY_CARE_PROVIDER_SITE_OTHER): Payer: Medicare Other | Admitting: Diagnostic Neuroimaging

## 2022-04-20 ENCOUNTER — Encounter: Payer: Self-pay | Admitting: Diagnostic Neuroimaging

## 2022-04-20 VITALS — BP 106/66 | HR 64 | Ht 67.0 in | Wt 137.0 lb

## 2022-04-20 DIAGNOSIS — G6281 Critical illness polyneuropathy: Secondary | ICD-10-CM | POA: Diagnosis not present

## 2022-04-20 DIAGNOSIS — G3184 Mild cognitive impairment, so stated: Secondary | ICD-10-CM | POA: Diagnosis not present

## 2022-04-20 DIAGNOSIS — R251 Tremor, unspecified: Secondary | ICD-10-CM

## 2022-04-20 NOTE — Patient Instructions (Signed)
  TREMOR / COGNITIVE CHANGES (post-critical illness + anxiety + PTSD + thyroid dz) - safety / supervision issues reviewed - daily physical activity / exercise (at least 15-30 minutes) - eat more plants / vegetables - increase social activities, brain stimulation, games, puzzles, hobbies, crafts, arts, music - aim for at least 7-8 hours sleep per night (or more) - caution with medications, finances, driving  CRITICAL ILLNESS NEUROPATHY - symptoms should improve gradually over time (up to 1 year or old) - continue gabapentin; may increase up to '600mg'$  three times a day  - consider OTC capsaicin cream, lidocaine patch / cream, alpha-lipoic acid '600mg'$  daily

## 2022-04-20 NOTE — Progress Notes (Unsigned)
[facility]  PATIENT: [name] Robinson DOB: [birth year]  REFERRING CLINICIAN: Venita Lick, NP HISTORY FROM: patient REASON FOR VISIT: new consult   HISTORICAL  CHIEF COMPLAINT:  Chief Complaint  Patient presents with   Neuropathy, Tremor    Rm 7 New Pt  "in hospital x 4 months last year, got sepsis now my whole body shakes, neuropathy in my feet; I want my brain checked out"      HISTORY OF PRESENT ILLNESS:   71 year old female here for evaluation of tremor and neuropathy.  Patient was hospitalized from November 2022 until March 2023.  She had severe sepsis and prolonged ICU stay.  After she came out ICU she had cognitive difficulty, aphasia, speech difficulty, numbness and tingling in her arms and legs.  Symptoms have improved gradually but are still persistent.   REVIEW OF SYSTEMS: Full 14 system review of systems performed and negative with exception of: As per HPI.  ALLERGIES: Allergies  Allergen Reactions   Solifenacin Rash    Patient had bad rash with this med.    HOME MEDICATIONS: Outpatient Medications Prior to Visit  Medication Sig Dispense Refill   busPIRone (BUSPAR) 15 MG tablet Take by mouth.     citalopram (CELEXA) 20 MG tablet Take 20 mg by mouth daily.     ergocalciferol (VITAMIN D2) 1.25 MG (50000 UT) capsule Take 1 capsule (50,000 Units total) by mouth once a week. 12 capsule 5   gabapentin (NEURONTIN) 300 MG capsule Take one tablet (300 MG) by mouth in the morning and at noon, then 600 MG (two tablets) by mouth at night. 360 capsule 4   levothyroxine (SYNTHROID) 25 MCG tablet Take 1 tablet (25 mcg total) by mouth daily. 30 tablet 6   Multiple Vitamin (MULTIVITAMIN) capsule Take 1 capsule by mouth daily.     traZODone (DESYREL) 50 MG tablet Take 50-100 mg by mouth at bedtime as needed.     vitamin B-12 (CYANOCOBALAMIN) 1000 MCG tablet Take 1,000 mcg by mouth daily.     No facility-administered medications prior to visit.     PAST MEDICAL HISTORY: Past Medical History:  Diagnosis Date   Hypothyroidism    Morbid obesity ([place]) 11/2019   s/p Barriatric Sgx ([facility])   Sleep apnea    doesn't use CPAP, lost weight, not needed    PAST SURGICAL HISTORY: Past Surgical History:  Procedure Laterality Date   ABDOMINAL HYSTERECTOMY     BARIATRIC SURGERY N/A 01/02/2020   duodenal switch   CERVICAL DISC SURGERY N/A    discectomy   COLONOSCOPY     ESOPHAGOGASTRODUODENOSCOPY  11/06/2020   EYE SURGERY Bilateral    laser procedure   LAPAROSCOPIC COLON RESECTION     MASS EXCISION Left 04/13/2021   Procedure: EXCISION MASS, left lateral thigh;  Surgeon: Ronny Bacon, MD;  Location: [facility] ORS;  Service: General;  Laterality: Left;   SHOULDER SURGERY Left    torn cartilege   TONSILLECTOMY N/A    TOTAL HIP ARTHROPLASTY Right 11/02/2020   TRANSTHORACIC ECHOCARDIOGRAM  07/14/2019   a) [facility]) Technically difficult.-Used Definity.  Nl LV Fxn -EF 55 - 60%.  No obvious WMA.  Mild MR.  No effusion;; b) Echo 04/16/2021-[facility]: (Fair quality) normal LV size and function.  EF 55-60%.  No R WMA.  Normal diastolic parameters.  Mild MR.  Unable to assess RVSP.    FAMILY HISTORY: Family History  Problem Relation Age of Onset   Thyroid disease Mother    Cancer -  Lung Father    Cancer - Lung Brother    Cancer Brother     SOCIAL HISTORY: Social History   Socioeconomic History   Marital status: Married    Spouse name: [name]   Number of children: 2   Years of education: Not on file   Highest education level: Some college, no degree  Occupational History   Not on file  Tobacco Use   Smoking status: Never   Smokeless tobacco: Never  Vaping Use   Vaping Use: Never used  Substance and Sexual Activity   Alcohol use: Never   Drug use: Never   Sexual activity: Not Currently  Other Topics Concern   Not on file  Social History Narrative   04/20/22 She is retired, lives with husband.    Otherwise, she is  usually on the go, not sedentary.   Social Determinants of Health   Financial Resource Strain: Low Risk  (04/28/2021)   Overall Financial Resource Strain (CARDIA)    Difficulty of Paying Living Expenses: Not hard at all  Food Insecurity: No Food Insecurity (04/28/2021)   Hunger Vital Sign    Worried About Running Out of Food in the Last Year: Never true    Ran Out of Food in the Last Year: Never true  Transportation Needs: No Transportation Needs (04/28/2021)   PRAPARE - Hydrologist (Medical): No    Lack of Transportation (Non-Medical): No  Physical Activity: Sufficiently Active (04/28/2021)   Exercise Vital Sign    Days of Exercise per Week: 7 days    Minutes of Exercise per Session: 30 min  Stress: No Stress Concern Present (04/28/2021)   [place]    Feeling of Stress : Not at all  Social Connections: [place] (04/28/2021)   Social Connection and Isolation Panel [NHANES]    Frequency of Communication with Friends and Family: Once a week    Frequency of Social Gatherings with Friends and Family: Twice a week    Attends Religious Services: More than 4 times per year    Active Member of [employer] or Organizations: Yes    Attends Music therapist: More than 4 times per year    Marital Status: Married  Human resources officer Violence: Not At Risk (04/28/2021)   Humiliation, Afraid, Rape, and Kick questionnaire    Fear of Current or Ex-Partner: No    Emotionally Abused: No    Physically Abused: No    Sexually Abused: No     PHYSICAL EXAM  GENERAL EXAM/CONSTITUTIONAL: Vitals:  Vitals:   04/20/22 1350  BP: 106/66  Pulse: 64  Weight: 137 lb (62.1 kg)  Height: '5\' 7"'$  (1.702 m)   Body mass index is 21.46 kg/m. Wt Readings from Last 3 Encounters:  04/20/22 137 lb (62.1 kg)  04/03/22 137 lb 8 oz (62.4 kg)  03/22/22 131 lb (59.4 kg)   Patient is in no distress; well  developed, nourished and groomed; neck is supple  CARDIOVASCULAR: Examination of carotid arteries is normal; no carotid bruits Regular rate and rhythm, no murmurs Examination of peripheral vascular system by observation and palpation is normal  EYES: Ophthalmoscopic exam of optic discs and posterior segments is normal; no papilledema or hemorrhages No results found.  MUSCULOSKELETAL: Gait, strength, tone, movements noted in Neurologic exam below  NEUROLOGIC: MENTAL STATUS:      No data to display         awake, alert, oriented to  person, place and time recent and remote memory intact normal attention and concentration language fluent, comprehension intact, naming intact fund of knowledge appropriate  CRANIAL NERVE:  2nd - no papilledema on fundoscopic exam 2nd, 3rd, 4th, 6th - pupils equal and reactive to light, visual fields full to confrontation, extraocular muscles intact, no nystagmus 5th - facial sensation symmetric 7th - facial strength symmetric 8th - hearing intact 9th - palate elevates symmetrically, uvula midline 11th - shoulder shrug symmetric 12th - tongue protrusion midline  MOTOR:  MILD POSTURAL TREMOR IN RUE > LUE normal bulk and tone, full strength in the BUE, BLE  SENSORY:  normal and symmetric to light touch, temperature, vibration; DECR IN TOES  COORDINATION:  finger-nose-finger, fine finger movements normal  REFLEXES:  deep tendon reflexes 1+ and symmetric  GAIT/STATION:  narrow based gait     DIAGNOSTIC DATA (LABS, IMAGING, TESTING) - I reviewed patient records, labs, notes, testing and imaging myself where available.  Lab Results  Component Value Date   WBC 3.8 02/15/2022   HGB 11.5 02/15/2022   HCT 33.9 (L) 02/15/2022   MCV 90 02/15/2022   PLT 168 02/15/2022      Component Value Date/Time   NA 144 02/15/2022 1356   K 3.6 02/15/2022 1356   CL 105 02/15/2022 1356   CO2 27 02/15/2022 1356   GLUCOSE 71 02/15/2022 1356    GLUCOSE 156 (H) 06/10/2021 0615   BUN 17 02/15/2022 1356   CREATININE 0.57 02/15/2022 1356   CALCIUM 9.8 02/15/2022 1356   PROT 6.9 02/15/2022 1356   ALBUMIN 4.4 02/15/2022 1356   AST 25 02/15/2022 1356   ALT 29 02/15/2022 1356   ALKPHOS 102 02/15/2022 1356   BILITOT 0.4 02/15/2022 1356   GFRNONAA >60 06/10/2021 0615   Lab Results  Component Value Date   CHOL 115 02/28/2021   HDL 69 02/28/2021   LDLCALC 32 02/28/2021   TRIG 64 02/28/2021   No results found for: "HGBA1C" Lab Results  Component Value Date   VITAMINB12 995 02/15/2022   Lab Results  Component Value Date   TSH 12.500 (H) 04/07/2022       ASSESSMENT AND PLAN  71 y.o. 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 Tremor   3. Mild cognitive impairment     PLAN:  TREMOR / COGNITIVE CHANGES (post-critical illness + anxiety + PTSD + thyroid dz) - safety / supervision issues reviewed - daily physical activity / exercise (at least 15-30 minutes) - eat more plants / vegetables - increase social activities, brain stimulation, games, puzzles, hobbies, crafts, arts, music - aim for at least 7-8 hours sleep per night (or more) - caution with medications, finances, driving  CRITICAL ILLNESS NEUROPATHY - symptoms should improve gradually over time (up to 1 year or old) - continue gabapentin; may increase up to '600mg'$  three times a day  - consider OTC capsaicin cream, lidocaine patch / cream, alpha-lipoic acid '600mg'$  daily  Return for return to PCP, pending if symptoms worsen or fail to improve.    Penni Bombard, MD 02/19/5884, 0:27 PM Certified in Neurology, Neurophysiology and Neuroimaging  [facility] [address] [place], [place] [address] [phone]

## 2022-04-25 ENCOUNTER — Encounter: Payer: Self-pay | Admitting: Diagnostic Neuroimaging

## 2022-04-27 ENCOUNTER — Ambulatory Visit: Payer: Medicare Other | Admitting: Nurse Practitioner

## 2022-05-03 ENCOUNTER — Ambulatory Visit (INDEPENDENT_AMBULATORY_CARE_PROVIDER_SITE_OTHER): Payer: Medicare Other | Admitting: *Deleted

## 2022-05-03 DIAGNOSIS — Z0000 Encounter for general adult medical examination without abnormal findings: Secondary | ICD-10-CM

## 2022-05-03 NOTE — Progress Notes (Signed)
Subjective:   [name] Robinson is a 71 y.o. female who presents for Medicare Annual (Subsequent) preventive examination.  I connected with  [name] Robinson on 05/03/22 by a telephone  enabled telemedicine application and verified that I am speaking with the correct person using two identifiers.   I discussed the limitations of evaluation and management by telemedicine. The patient expressed understanding and agreed to proceed.  Patient location: home  Provider location: Tele-health-[facility]     Review of Systems     Cardiac Risk Factors include: advanced age (>78mn, >>21women)     Objective:    Today's Vitals   There is no height or weight on file to calculate BMI.     05/03/2022   10:07 AM 11/25/2021    2:34 PM 06/10/2021    6:14 AM 04/28/2021    4:36 PM 04/13/2021    6:28 AM 04/04/2021   12:27 PM  Advanced Directives  Does Patient Have a Medical Advance Directive? No No No No Yes Yes  Type of Advance Directive     Living will;Healthcare Power of [place]orth Yel[place]iving will  Does patient want to make changes to medical advance directive?    No - Patient declined No - Patient declined   Copy of H[place]in Chart? No - copy requested    No - copy requested No - copy requested  Would patient like information on creating a medical advance directive?   No - Patient declined No - Patient declined      Current Medications (verified) Outpatient Encounter Medications as of 05/03/2022  Medication Sig   busPIRone (BUSPAR) 15 MG tablet Take by mouth.   citalopram (CELEXA) 20 MG tablet Take 20 mg by mouth daily.   ergocalciferol (VITAMIN D2) 1.25 MG (50000 UT) capsule Take 1 capsule (50,000 Units total) by mouth once a week.   gabapentin (NEURONTIN) 300 MG capsule Take one tablet (300 MG) by mouth in the morning and at noon, then 600 MG (two tablets) by mouth at night.   levothyroxine (SYNTHROID) 25 MCG tablet Take 1 tablet (25 mcg total) by mouth  daily.   Multiple Vitamin (MULTIVITAMIN) capsule Take 1 capsule by mouth daily.   traZODone (DESYREL) 50 MG tablet Take 50-100 mg by mouth at bedtime as needed.   vitamin B-12 (CYANOCOBALAMIN) 1000 MCG tablet Take 1,000 mcg by mouth daily.   No facility-administered encounter medications on file as of 05/03/2022.    Allergies (verified) Solifenacin   History: Past Medical History:  Diagnosis Date   Hypothyroidism    Morbid obesity ([facility] 11/2019   s/p Barriatric Sgx ([facility])   Sleep apnea    doesn't use CPAP, lost weight, not needed   Past Surgical History:  Procedure Laterality Date   ABDOMINAL HYSTERECTOMY     BARIATRIC SURGERY N/A 01/02/2020   duodenal switch   CERVICAL DISC SURGERY N/A    discectomy   COLONOSCOPY     ESOPHAGOGASTRODUODENOSCOPY  11/06/2020   EYE SURGERY Bilateral    laser procedure   LAPAROSCOPIC COLON RESECTION     MASS EXCISION Left 04/13/2021   Procedure: EXCISION MASS, left lateral thigh;  Surgeon: RRonny Bacon MD;  Location: [facility] ORS;  Service: General;  Laterality: Left;   SHOULDER SURGERY Left    torn cartilege   TONSILLECTOMY N/A    TOTAL HIP ARTHROPLASTY Right 11/02/2020   TRANSTHORACIC ECHOCARDIOGRAM  07/14/2019   [facility].-Used Definity.  Nl LV Fxn -EF 55 -  60%.  No obvious WMA.  Mild MR.  No effusion;; b) Echo 04/16/2021-[facility]: (Fair quality) normal LV size and function.  EF 55-60%.  No R WMA.  Normal diastolic parameters.  Mild MR.  Unable to assess RVSP.   Family History  Problem Relation Age of Onset   Thyroid disease Mother    Cancer - Lung Father    Cancer - Lung Brother    Cancer Brother    Social History   Socioeconomic History   Marital status: Married    Spouse name: [name]   Number of children: 2   Years of education: Not on file   Highest education level: Some college, no degree  Occupational History   Not on file  Tobacco Use   Smoking status: Never   Smokeless tobacco: Never   Vaping Use   Vaping Use: Never used  Substance and Sexual Activity   Alcohol use: Never   Drug use: Never   Sexual activity: Not Currently  Other Topics Concern   Not on file  Social History Narrative   04/20/22 She is retired, lives with husband.    Otherwise, she is usually on the go, not sedentary.   Social Determinants of Health   Financial Resource Strain: Low Risk  (05/03/2022)   Overall Financial Resource Strain (CARDIA)    Difficulty of Paying Living Expenses: Not hard at all  Food Insecurity: No Food Insecurity (05/03/2022)   Hunger Vital Sign    Worried About Running Out of Food in the Last Year: Never true    Ran Out of Food in the Last Year: Never true  Transportation Needs: No Transportation Needs (05/03/2022)   PRAPARE - Hydrologist (Medical): No    Lack of Transportation (Non-Medical): No  Physical Activity: Insufficiently Active (05/03/2022)   Exercise Vital Sign    Days of Exercise per Week: 4 days    Minutes of Exercise per Session: 30 min  Stress: No Stress Concern Present (05/03/2022)   [place]    Feeling of Stress : Not at all  Social Connections: [place] (05/03/2022)   Social Connection and Isolation Panel [NHANES]    Frequency of Communication with Friends and Family: More than three times a week    Frequency of Social Gatherings with Friends and Family: Twice a week    Attends Religious Services: More than 4 times per year    Active Member of [employer] or Organizations: Yes    Attends Music therapist: More than 4 times per year    Marital Status: Married    Tobacco Counseling Counseling given: Not Answered   Clinical Intake:  Pre-visit preparation completed: Yes  Pain : No/denies pain     Diabetes: No  How often do you need to have someone help you when you read instructions, pamphlets, or other written materials from your  doctor or pharmacy?: 1 - Never  Diabetic?  no  Interpreter Needed?: No  Information entered by :: Leroy Kennedy LPN   Activities of Daily Living    05/03/2022   10:14 AM  In your present state of health, do you have any difficulty performing the following activities:  Hearing? 0  Vision? 0  Difficulty concentrating or making decisions? 0  Walking or climbing stairs? 1  Dressing or bathing? 0  Doing errands, shopping? 1  Preparing Food and eating ? N  Using the Toilet? N  In the past six  months, have you accidently leaked urine? Y  Do you have problems with loss of bowel control? N  Managing your Medications? N  Managing your Finances? N  Housekeeping or managing your Housekeeping? N    Patient Care Team: Venita Lick, NP as PCP - General (Nurse Practitioner) Jason Coop, NP as Nurse Practitioner (Hospice and Palliative Medicine) Talbot Grumbling, PA-C as Consulting Physician (Physician Assistant) Diona Browner, MD as Referring Physician (Surgery) [place], PA-C as Physician Assistant (Physician Assistant)  Indicate any recent Medical Services you may have received from other than [facility] providers in the past year (date may be approximate).     Assessment:   This is a routine wellness examination for [place].  Hearing/Vision screen Hearing Screening - Comments:: No trouble hearing Vision Screening - Comments:: Vision Up to date  Dietary issues and exercise activities discussed: Current Exercise Habits: Home exercise routine, Type of exercise: walking, Time (Minutes): 25, Frequency (Times/Week): 3, Weekly Exercise (Minutes/Week): 75, Intensity: Mild   Goals Addressed             This Visit's Progress    Patient Stated       Patient states would like to get back to normal with speech,walking       Depression Screen    05/03/2022   10:11 AM 04/03/2022    2:18 PM 02/20/2022    2:28 PM 01/11/2022   11:17 AM 12/14/2021   10:19 AM 11/16/2021    11:01 AM 04/28/2021    4:31 PM  PHQ 2/9 Scores  PHQ - 2 Score 0 '2 4 6 2 2 '$ 0  PHQ- 9 Score '2 7 10 14 5 12     '$ Fall Risk    05/03/2022   10:08 AM 04/03/2022    2:18 PM 02/20/2022    2:28 PM 12/14/2021   10:19 AM 11/16/2021   11:02 AM  [place] in the past year? 0 0 0 0 0  Number falls in past yr: 0 0 0 0 0  Injury with Fall? 0 0 0 0 0  Risk for fall due to : Impaired balance/gait No Fall Risks No Fall Risks No Fall Risks No Fall Risks  Follow up Falls evaluation completed;Education provided;Falls prevention discussed Falls evaluation completed Falls evaluation completed Falls evaluation completed Falls evaluation completed    FALL RISK PREVENTION PERTAINING TO THE HOME:  Any stairs in or around the home? No  If so, are there any without handrails? No  Home free of loose throw rugs in walkways, pet beds, electrical cords, etc? Yes  Adequate lighting in your home to reduce risk of falls? Yes   ASSISTIVE DEVICES UTILIZED TO PREVENT FALLS:  Life alert? No  Use of a cane, walker or w/c? Yes  Grab bars in the bathroom? Yes  Shower chair or bench in shower? Yes  Elevated toilet seat or a handicapped toilet? Yes   TIMED UP AND GO:  Was the test performed? No .    Cognitive Function:        05/03/2022   10:06 AM 04/28/2021    4:37 PM  6CIT Screen  What Year? 0 points 0 points  What month? 0 points 0 points  What time? 0 points 0 points  Count back from 20 0 points 0 points  Months in reverse 0 points 4 points  Repeat phrase 0 points 0 points  Total Score 0 points 4 points    Immunizations Immunization History  Administered Date(s) Administered   Fluad Quad(high Dose 65+) 05/20/2021   Influenza Inj Mdck Quad Pf 05/29/2017   Influenza, High Dose Seasonal PF 05/17/2018, 05/07/2019   Influenza,inj,Quad PF,6+ Mos 06/18/2015, 05/19/2016, 05/07/2019   Influenza-Unspecified 05/29/2017, 06/16/2020   PFIZER(Purple Top)SARS-COV-2 Vaccination 10/11/2019, 11/05/2019   PPD  Test 01/04/2021   Pneumococcal Conjugate-13 05/07/2019   Td 02/28/2021   Tdap 06/21/2003    TDAP status: Up to date  Flu Vaccine status: Due, Education has been provided regarding the importance of this vaccine. Advised may receive this vaccine at local pharmacy or Health Dept. Aware to provide a copy of the vaccination record if obtained from local pharmacy or Health Dept. Verbalized acceptance and understanding.  Pneumococcal vaccine status: Due, Education has been provided regarding the importance of this vaccine. Advised may receive this vaccine at local pharmacy or Health Dept. Aware to provide a copy of the vaccination record if obtained from local pharmacy or Health Dept. Verbalized acceptance and understanding.  Covid-19 vaccine status: Information provided on how to obtain vaccines.   Qualifies for Shingles Vaccine? Yes   Zostavax completed No   Shingrix Completed?: Yes  Screening Tests Health Maintenance  Topic Date Due   Pneumonia Vaccine 37+ Years old (2 - PPSV23 or PCV20) 05/06/2020   INFLUENZA VACCINE  03/14/2022   COVID-19 Vaccine (3 - Pfizer series) 05/19/2022 (Originally 12/31/2019)   Zoster Vaccines- Shingrix (1 of 2) 08/02/2022 (Originally 11/12/2002)   COLONOSCOPY (Pts 45-77yr Insurance coverage will need to be confirmed)  02/18/2023   MAMMOGRAM  03/16/2023   DEXA SCAN  11/23/2024   TETANUS/TDAP  03/01/2031   Hepatitis C Screening  Completed   HPV VACCINES  Aged Out    Health Maintenance  Health Maintenance Due  Topic Date Due   Pneumonia Vaccine 69 Years old (2 - PPSV23 or PCV20) 05/06/2020   INFLUENZA VACCINE  03/14/2022    Colorectal cancer screening: Type of screening: Colonoscopy. Completed 2014. Repeat every 10 years  Mammogram   is scheduled  Bone Density status: Completed 2020. Results reflect: Bone density results: OSTEOPENIA. Repeat every 5 years.  Lung Cancer Screening: (Low Dose CT Chest recommended if Age 69-80years, 30 pack-year  currently smoking OR have quit w/in 15years.) does not qualify.   Lung Cancer Screening Referral:   Additional Screening:  Hepatitis C Screening: does not qualify; Completed 2022  Vision Screening: Recommended annual ophthalmology exams for early detection of glaucoma and other disorders of the eye. Is the patient up to date with their annual eye exam?  Yes  Who is the provider or what is the name of the office in which the patient attends annual eye exams? Vision If pt is not established with a provider, would they like to be referred to a provider to establish care? No .   Dental Screening: Recommended annual dental exams for proper oral hygiene  Community Resource Referral / Chronic Care Management: CRR required this visit?  No   CCM required this visit?  No      Plan:     I have personally reviewed and noted the following in the patient's chart:   Medical and social history Use of alcohol, tobacco or illicit drugs  Current medications and supplements including opioid prescriptions. Patient is not currently taking opioid prescriptions. Functional ability and status Nutritional status Physical activity Advanced directives List of other physicians Hospitalizations, surgeries, and ER visits in previous 12 months Vitals Screenings to include cognitive, depression, and falls Referrals and appointments  In  addition, I have reviewed and discussed with patient certain preventive protocols, quality metrics, and best practice recommendations. A written personalized care plan for preventive services as well as general preventive health recommendations were provided to patient.     Leroy Kennedy, LPN   5/99/2341   Nurse Notes:

## 2022-05-03 NOTE — Patient Instructions (Signed)
[name] Robinson , Thank you for taking time to come for your Medicare Wellness Visit. I appreciate your ongoing commitment to your health goals. Please review the following plan we discussed and let me know if I can assist you in the future.   Screening recommendations/referrals: Colonoscopy: Education provided Mammogram: scheduled Bone Density: up to date Recommended yearly ophthalmology/optometry visit for glaucoma screening and checkup Recommended yearly dental visit for hygiene and checkup  Vaccinations: Influenza vaccine: Education provided Pneumococcal vaccine: Education provided Tdap vaccine: up to date Shingles vaccine: Education provided    Advanced directives: Education provided    Next appointment: 05-19-2022 @ 1:20 [place]   Preventive Care 85 Years and Older, Female Preventive care refers to lifestyle choices and visits with your health care provider that can promote health and wellness. What does preventive care include? A yearly physical exam. This is also called an annual well check. Dental exams once or twice a year. Routine eye exams. Ask your health care provider how often you should have your eyes checked. Personal lifestyle choices, including: Daily care of your teeth and gums. Regular physical activity. Eating a healthy diet. Avoiding tobacco and drug use. Limiting alcohol use. Practicing safe sex. Taking low-dose aspirin every day. Taking vitamin and mineral supplements as recommended by your health care provider. What happens during an annual well check? The services and screenings done by your health care provider during your annual well check will depend on your age, overall health, lifestyle risk factors, and family history of disease. Counseling  Your health care provider may ask you questions about your: Alcohol use. Tobacco use. Drug use. Emotional well-being. Home and relationship well-being. Sexual activity. Eating habits. History of  falls. Memory and ability to understand (cognition). Work and work Statistician. Reproductive health. Screening  You may have the following tests or measurements: Height, weight, and BMI. Blood pressure. Lipid and cholesterol levels. These may be checked every 5 years, or more frequently if you are over 58 years old. Skin check. Lung cancer screening. You may have this screening every year starting at age 46 if you have a 30-pack-year history of smoking and currently smoke or have quit within the past 15 years. Fecal occult blood test (FOBT) of the stool. You may have this test every year starting at age 27. Flexible sigmoidoscopy or colonoscopy. You may have a sigmoidoscopy every 5 years or a colonoscopy every 10 years starting at age 19. Hepatitis C blood test. Hepatitis B blood test. Sexually transmitted disease (STD) testing. Diabetes screening. This is done by checking your blood sugar (glucose) after you have not eaten for a while (fasting). You may have this done every 1-3 years. Bone density scan. This is done to screen for osteoporosis. You may have this done starting at age 31. Mammogram. This may be done every 1-2 years. Talk to your health care provider about how often you should have regular mammograms. Talk with your health care provider about your test results, treatment options, and if necessary, the need for more tests. Vaccines  Your health care provider may recommend certain vaccines, such as: Influenza vaccine. This is recommended every year. Tetanus, diphtheria, and acellular pertussis (Tdap, Td) vaccine. You may need a Td booster every 10 years. Zoster vaccine. You may need this after age 67. Pneumococcal 13-valent conjugate (PCV13) vaccine. One dose is recommended after age 65. Pneumococcal polysaccharide (PPSV23) vaccine. One dose is recommended after age 68. Talk to your health care provider about which screenings and vaccines you need  and how often you need  them. This information is not intended to replace advice given to you by your health care provider. Make sure you discuss any questions you have with your health care provider. Document Released: 08/27/2015 Document Revised: 04/19/2016 Document Reviewed: 06/01/2015 Elsevier Interactive Patient Education  2017 [place] Prevention in the Home Falls can cause injuries. They can happen to people of all ages. There are many things you can do to make your home safe and to help prevent falls. What can I do on the outside of my home? Regularly fix the edges of walkways and driveways and fix any cracks. Remove anything that might make you trip as you walk through a door, such as a raised step or threshold. Trim any bushes or trees on the path to your home. Use bright outdoor lighting. Clear any walking paths of anything that might make someone trip, such as rocks or tools. Regularly check to see if handrails are loose or broken. Make sure that both sides of any steps have handrails. Any raised decks and porches should have guardrails on the edges. Have any leaves, snow, or ice cleared regularly. Use sand or salt on walking paths during winter. Clean up any spills in your garage right away. This includes oil or grease spills. What can I do in the bathroom? Use night lights. Install grab bars by the toilet and in the tub and shower. Do not use towel bars as grab bars. Use non-skid mats or decals in the tub or shower. If you need to sit down in the shower, use a plastic, non-slip stool. Keep the floor dry. Clean up any water that spills on the floor as soon as it happens. Remove soap buildup in the tub or shower regularly. Attach bath mats securely with double-sided non-slip rug tape. Do not have throw rugs and other things on the floor that can make you trip. What can I do in the bedroom? Use night lights. Make sure that you have a light by your bed that is easy to reach. Do not use  any sheets or blankets that are too big for your bed. They should not hang down onto the floor. Have a firm chair that has side arms. You can use this for support while you get dressed. Do not have throw rugs and other things on the floor that can make you trip. What can I do in the kitchen? Clean up any spills right away. Avoid walking on wet floors. Keep items that you use a lot in easy-to-reach places. If you need to reach something above you, use a strong step stool that has a grab bar. Keep electrical cords out of the way. Do not use floor polish or wax that makes floors slippery. If you must use wax, use non-skid floor wax. Do not have throw rugs and other things on the floor that can make you trip. What can I do with my stairs? Do not leave any items on the stairs. Make sure that there are handrails on both sides of the stairs and use them. Fix handrails that are broken or loose. Make sure that handrails are as long as the stairways. Check any carpeting to make sure that it is firmly attached to the stairs. Fix any carpet that is loose or worn. Avoid having throw rugs at the top or bottom of the stairs. If you do have throw rugs, attach them to the floor with carpet tape. Make sure that you  have a light switch at the top of the stairs and the bottom of the stairs. If you do not have them, ask someone to add them for you. What else can I do to help prevent falls? Wear shoes that: Do not have high heels. Have rubber bottoms. Are comfortable and fit you well. Are closed at the toe. Do not wear sandals. If you use a stepladder: Make sure that it is fully opened. Do not climb a closed stepladder. Make sure that both sides of the stepladder are locked into place. Ask someone to hold it for you, if possible. Clearly mark and make sure that you can see: Any grab bars or handrails. First and last steps. Where the edge of each step is. Use tools that help you move around (mobility aids)  if they are needed. These include: Canes. Walkers. Scooters. Crutches. Turn on the lights when you go into a dark area. Replace any light bulbs as soon as they burn out. Set up your furniture so you have a clear path. Avoid moving your furniture around. If any of your floors are uneven, fix them. If there are any pets around you, be aware of where they are. Review your medicines with your doctor. Some medicines can make you feel dizzy. This can increase your chance of falling. Ask your doctor what other things that you can do to help prevent falls. This information is not intended to replace advice given to you by your health care provider. Make sure you discuss any questions you have with your health care provider. Document Released: 05/27/2009 Document Revised: 01/06/2016 Document Reviewed: 09/04/2014 Elsevier Interactive Patient Education  2017 [employer].

## 2022-05-05 ENCOUNTER — Ambulatory Visit: Payer: Medicare Other | Admitting: Nurse Practitioner

## 2022-05-11 NOTE — Patient Instructions (Signed)

## 2022-05-12 ENCOUNTER — Ambulatory Visit (INDEPENDENT_AMBULATORY_CARE_PROVIDER_SITE_OTHER): Payer: Medicare Other | Admitting: Nurse Practitioner

## 2022-05-12 ENCOUNTER — Encounter: Payer: Self-pay | Admitting: Nurse Practitioner

## 2022-05-12 VITALS — BP 120/69 | HR 51 | Ht 67.0 in | Wt 144.2 lb

## 2022-05-12 DIAGNOSIS — E063 Autoimmune thyroiditis: Secondary | ICD-10-CM

## 2022-05-12 DIAGNOSIS — E038 Other specified hypothyroidism: Secondary | ICD-10-CM | POA: Diagnosis not present

## 2022-05-12 MED ORDER — LEVOTHYROXINE SODIUM 50 MCG PO TABS: 50.0000 ug | ORAL_TABLET | Freq: Every day | ORAL | 1 refills | Status: DC

## 2022-05-12 NOTE — Progress Notes (Signed)
Endocrinology Follow Up Note                                         05/12/2022, 9:33 AM  Subjective:   Subjective    [name] Robinson is a 71 y.o.-year-old female patient being seen in follow up after being seen in consultation for hypothyroidism referred by Venita Lick, NP.   Past Medical History:  Diagnosis Date   Hypothyroidism    Morbid obesity ([place]) 11/2019   s/p Barriatric Sgx ([facility])   Sleep apnea    doesn't use CPAP, lost weight, not needed    Past Surgical History:  Procedure Laterality Date   ABDOMINAL HYSTERECTOMY     BARIATRIC SURGERY N/A 01/02/2020   duodenal switch   CERVICAL DISC SURGERY N/A    discectomy   COLONOSCOPY     ESOPHAGOGASTRODUODENOSCOPY  11/06/2020   EYE SURGERY Bilateral    laser procedure   LAPAROSCOPIC COLON RESECTION     MASS EXCISION Left 04/13/2021   Procedure: EXCISION MASS, left lateral thigh;  Surgeon: Ronny Bacon, MD;  Location: [facility] ORS;  Service: General;  Laterality: Left;   SHOULDER SURGERY Left    torn cartilege   TONSILLECTOMY N/A    TOTAL HIP ARTHROPLASTY Right 11/02/2020   TRANSTHORACIC ECHOCARDIOGRAM  07/14/2019   a) [facility]) Technically difficult.-Used Definity.  Nl LV Fxn -EF 55 - 60%.  No obvious WMA.  Mild MR.  No effusion;; b) Echo 04/16/2021-[facility]: (Fair quality) normal LV size and function.  EF 55-60%.  No R WMA.  Normal diastolic parameters.  Mild MR.  Unable to assess RVSP.    Social History   Socioeconomic History   Marital status: Married    Spouse name: [name]   Number of children: 2   Years of education: Not on file   Highest education level: Some college, no degree  Occupational History   Not on file  Tobacco Use   Smoking status: Never   Smokeless tobacco: Never  Vaping Use   Vaping Use: Never used  Substance and Sexual Activity   Alcohol use: Never   Drug use: Never   Sexual activity: Not Currently  Other  Topics Concern   Not on file  Social History Narrative   04/20/22 She is retired, lives with husband.    Otherwise, she is usually on the go, not sedentary.   Social Determinants of Health   Financial Resource Strain: Low Risk  (05/03/2022)   Overall Financial Resource Strain (CARDIA)    Difficulty of Paying Living Expenses: Not hard at all  Food Insecurity: No Food Insecurity (05/03/2022)   Hunger Vital Sign    Worried About Running Out of Food in the Last Year: Never true    Ran Out of Food in the Last Year: Never true  Transportation Needs: No Transportation Needs (05/03/2022)   PRAPARE - Hydrologist (Medical): No    Lack of Transportation (Non-Medical): No  Physical Activity: Insufficiently Active (05/03/2022)  Exercise Vital Sign    Days of Exercise per Week: 4 days    Minutes of Exercise per Session: 30 min  Stress: No Stress Concern Present (05/03/2022)   [place]    Feeling of Stress : Not at all  Social Connections: [place] (05/03/2022)   Social Connection and Isolation Panel [NHANES]    Frequency of Communication with Friends and Family: More than three times a week    Frequency of Social Gatherings with Friends and Family: Twice a week    Attends Religious Services: More than 4 times per year    Active Member of [employer] or Organizations: Yes    Attends Music therapist: More than 4 times per year    Marital Status: Married    Family History  Problem Relation Age of Onset   Thyroid disease Mother    Cancer - Lung Father    Cancer - Lung Brother    Cancer Brother     Outpatient Encounter Medications as of 05/12/2022  Medication Sig   busPIRone (BUSPAR) 15 MG tablet Take by mouth.   citalopram (CELEXA) 20 MG tablet Take 20 mg by mouth daily.   ergocalciferol (VITAMIN D2) 1.25 MG (50000 UT) capsule Take 1 capsule (50,000 Units total) by mouth once a  week.   gabapentin (NEURONTIN) 300 MG capsule Take one tablet (300 MG) by mouth in the morning and at noon, then 600 MG (two tablets) by mouth at night.   Multiple Vitamin (MULTIVITAMIN) capsule Take 1 capsule by mouth daily.   traZODone (DESYREL) 50 MG tablet Take 50-100 mg by mouth at bedtime as needed.   vitamin B-12 (CYANOCOBALAMIN) 1000 MCG tablet Take 1,000 mcg by mouth daily.   [DISCONTINUED] levothyroxine (SYNTHROID) 25 MCG tablet Take 1 tablet (25 mcg total) by mouth daily.   levothyroxine (SYNTHROID) 50 MCG tablet Take 1 tablet (50 mcg total) by mouth daily before breakfast.   No facility-administered encounter medications on file as of 05/12/2022.    ALLERGIES: Allergies  Allergen Reactions   Solifenacin Rash    Patient had bad rash with this med.   VACCINATION STATUS: Immunization History  Administered Date(s) Administered   Fluad Quad(high Dose 65+) 05/20/2021   Influenza Inj Mdck Quad Pf 05/29/2017   Influenza, High Dose Seasonal PF 05/17/2018, 05/07/2019   Influenza,inj,Quad PF,6+ Mos 06/18/2015, 05/19/2016, 05/07/2019   Influenza-Unspecified 05/29/2017, 06/16/2020   PFIZER(Purple Top)SARS-COV-2 Vaccination 10/11/2019, 11/05/2019   PPD Test 01/04/2021   Pneumococcal Conjugate-13 05/07/2019   Td 02/28/2021   Tdap 06/21/2003     HPI   [name] Robinson  is a patient with the above medical history. she was diagnosed with hypothyroidism years ago, which required subsequent initiation of thyroid hormone replacement. she was given various doses of Levothyroxine over the years, currently on 25 micrograms. she reports compliance to this medication:  Taking it daily on empty stomach  with water, separated by >30 minutes before breakfast and other medications , and by at least 4 hours from calcium, iron, PPIs, multivitamins.  She notes she was hospitalized for sepsis last year and has since developed a constant tremor in which she has been trying to solve.  She weaned herself off  Fentanyl and that did not help her tremors.  I reviewed patient's thyroid tests:  Lab Results  Component Value Date   TSH 12.500 (H) 04/07/2022   TSH 3.590 02/15/2022   TSH 0.056 (L) 12/14/2021   TSH 0.237 (L) 11/28/2021  TSH 4.220 11/09/2021   TSH 1.250 02/28/2021   FREET4 0.62 (L) 04/07/2022   FREET4 1.00 02/15/2022   FREET4 1.84 (H) 12/14/2021   FREET4 1.24 (H) 11/28/2021   FREET4 1.08 02/28/2021     Pt describes: - weight loss - tremors  Pt denies feeling nodules in neck, hoarseness, dysphagia/odynophagia, SOB with lying down.  she denies family history of thyroid disorders.  No family history of thyroid cancer.  No history of radiation therapy to head or neck.  No recent use of iodine supplements.  Denies use of Biotin containing supplements.  I reviewed her chart and she also has a history of neuropathy, vitamin d deficiency, cardiomyopathy, pernicious anemia, bariatric surgery.   ROS:  Constitutional: + weight gain (regained some previously lost unintentionally), no fatigue, no subjective hyperthermia, no subjective hypothermia Eyes: no blurry vision, no xerophthalmia ENT: no sore throat, no nodules palpated in throat, no dysphagia/odynophagia, no hoarseness Cardiovascular: no chest pain, no SOB, no palpitations, no leg swelling Respiratory: no cough, no SOB Gastrointestinal: no nausea/vomiting/diarrhea Musculoskeletal: no muscle/joint aches, walks with cane Skin: no rashes Neurological: + full body tremors-improving under neurology care, no numbness, no tingling, no dizziness Psychiatric: no depression, no anxiety   Objective:   Objective     BP 120/69 (BP Location: Right Arm, Patient Position: Sitting, Cuff Size: Normal)   Pulse (!) 51   Ht '5\' 7"'$  (1.702 m)   Wt 144 lb 3.2 oz (65.4 kg)   BMI 22.58 kg/m  Wt Readings from Last 3 Encounters:  05/12/22 144 lb 3.2 oz (65.4 kg)  04/20/22 137 lb (62.1 kg)  04/03/22 137 lb 8 oz (62.4 kg)    BP Readings  from Last 3 Encounters:  05/12/22 120/69  04/20/22 106/66  04/03/22 107/66      Physical Exam- Limited  Constitutional:  Body mass index is 22.58 kg/m. , not in acute distress, normal state of mind Eyes:  EOMI, no exophthalmos Neck: Supple Cardiovascular: RRR, no murmurs, rubs, or gallops, no edema Respiratory: Adequate breathing efforts, no crackles, rales, rhonchi, or wheezing Musculoskeletal: no gross deformities, strength intact in all four extremities, no gross restriction of joint movements Skin:  no rashes, no hyperemia Neurological: slight resting body tremor   CMP ( most recent) CMP     Component Value Date/Time   NA 144 02/15/2022 1356   K 3.6 02/15/2022 1356   CL 105 02/15/2022 1356   CO2 27 02/15/2022 1356   GLUCOSE 71 02/15/2022 1356   GLUCOSE 156 (H) 06/10/2021 0615   BUN 17 02/15/2022 1356   CREATININE 0.57 02/15/2022 1356   CALCIUM 9.8 02/15/2022 1356   PROT 6.9 02/15/2022 1356   ALBUMIN 4.4 02/15/2022 1356   AST 25 02/15/2022 1356   ALT 29 02/15/2022 1356   ALKPHOS 102 02/15/2022 1356   BILITOT 0.4 02/15/2022 1356   GFRNONAA >60 06/10/2021 0615     Diabetic Labs (most recent): No results found for: "HGBA1C", "MICROALBUR"   Lipid Panel ( most recent) Lipid Panel     Component Value Date/Time   CHOL 115 02/28/2021 1103   TRIG 64 02/28/2021 1103   HDL 69 02/28/2021 1103   LDLCALC 32 02/28/2021 1103   LABVLDL 14 02/28/2021 1103       Lab Results  Component Value Date   TSH 12.500 (H) 04/07/2022   TSH 3.590 02/15/2022   TSH 0.056 (L) 12/14/2021   TSH 0.237 (L) 11/28/2021   TSH 4.220 11/09/2021   TSH 1.250 02/28/2021  FREET4 0.62 (L) 04/07/2022   FREET4 1.00 02/15/2022   FREET4 1.84 (H) 12/14/2021   FREET4 1.24 (H) 11/28/2021   FREET4 1.08 02/28/2021     Thyroid US from 02/23/22 CLINICAL DATA:  Other.  Hashimoto's thyroiditis   EXAM: THYROID ULTRASOUND   TECHNIQUE: Ultrasound examination of the thyroid gland and adjacent  soft tissues was performed.   COMPARISON:  None Available.   FINDINGS: Parenchymal Echotexture: Markedly heterogenous   Isthmus: 0.2 cm   Right lobe: 5.4 x 3.1 x 2.6 cm   Left lobe: 5.8 x 3.2 x 2.4 cm   _________________________________________________________   Estimated total number of nodules >/= 1 cm: 0   Number of spongiform nodules >/=  2 cm not described below (TR1): 0   Number of mixed cystic and solid nodules >/= 1.5 cm not described below (TR2): 0   _________________________________________________________   No discrete nodules are seen within the thyroid gland.   IMPRESSION: 1. Diffusely enlarged and heterogeneous thyroid gland. The imaging appearance is consistent with the clinical history of Hashimoto's thyroiditis. 2. No discrete thyroid nodules are visualized.     Electronically Signed   By: Jacqulynn Cadet M.D.   On: 02/24/2022 16:12   Latest Reference Range & Units 11/09/21 17:05 11/28/21 16:19 12/14/21 11:13 02/15/22 13:56 04/07/22 11:58  TSH 0.450 - 4.500 uIU/mL 4.220 0.237 (L) 0.056 (L) 3.590 12.500 (H)  Triiodothyronine,Free,Serum 2.0 - 4.4 pg/mL     1.5 (L)  T4,Free(Direct) 0.82 - 1.77 ng/dL  1.24 (H) 1.84 (H) 1.00 0.62 (L)  Thyroperoxidase Ab SerPl-aCnc 0 - 34 IU/mL     162 (H)  Thyroglobulin Antibody 0.0 - 0.9 IU/mL     3.9 (H)  (L): Data is abnormally low (H): Data is abnormally high  Assessment & Plan:   ASSESSMENT / PLAN:  1. Hypothyroidism-r/t Hashimotos thyroiditis.   Patient with long-standing hypothyroidism, on levothyroxine therapy. On physical exam , patient  does not  have  gross goiter, thyroid nodules, or neck compression symptoms.  Positive antibody testing confirms suspicion of autoimmune thyroid disease.  Her previsit thyroid function tests are consistent with slight under-replacement.  She is advised to increase her dose of Levothyroxine to 50 mcg po daily before breakfast (may take 2 of her 25 mcg pills until she  depletes current supply).   - We discussed about correct intake of levothyroxine, at fasting, with water, separated by at least 30 minutes from breakfast, and separated by more than 4 hours from calcium, iron, multivitamins, acid reflux medications (PPIs). -Patient is made aware of the fact that thyroid hormone replacement is needed for life, dose to be adjusted by periodic monitoring of thyroid function tests.  -She recently had thyroid US on 7/13 which showed enlarged thyroid with heterogeneous tissue and no nodularity.     I spent 22 minutes in the care of the patient today including review of labs from Thyroid Function, CMP, and other relevant labs ; imaging/biopsy records (current and previous including abstractions from other facilities); face-to-face time discussing  her lab results and symptoms, medications doses, her options of short and long term treatment based on the latest standards of care / guidelines;   and documenting the encounter.  [name]  participated in the discussions, expressed understanding, and voiced agreement with the above plans.  All questions were answered to her satisfaction. she is encouraged to contact clinic should she have any questions or concerns prior to her return visit.   FOLLOW UP PLAN:  Return in  about 3 months (around 08/11/2022) for Thyroid follow up, Previsit labs.  Rayetta Pigg, [facility] [address] [place], [place] [address] Phone: [phone] Fax: [phone]  05/12/2022, 9:33 AM

## 2022-05-14 ENCOUNTER — Encounter: Payer: Self-pay | Admitting: Nurse Practitioner

## 2022-05-15 MED ORDER — TRAZODONE HCL 50 MG PO TABS: 100.0000 mg | ORAL_TABLET | Freq: Every evening | ORAL | 2 refills | Status: DC | PRN

## 2022-05-19 NOTE — Telephone Encounter (Signed)
Opened/did not need.

## 2022-05-24 ENCOUNTER — Telehealth: Payer: Self-pay | Admitting: Nurse Practitioner

## 2022-05-24 NOTE — Telephone Encounter (Signed)
Pts husband brought in disability placard form to be completed by provider. He would like to be called when this is ready for pick up. Form placed in provider's folder.

## 2022-05-24 NOTE — Telephone Encounter (Signed)
Called patient and notified her that her Handicap Placard was ready for pick up. Patient says she will pick up paperwork tomorrow.

## 2022-05-27 NOTE — Patient Instructions (Signed)

## 2022-05-29 ENCOUNTER — Encounter: Payer: Self-pay | Admitting: Nurse Practitioner

## 2022-05-29 ENCOUNTER — Ambulatory Visit (INDEPENDENT_AMBULATORY_CARE_PROVIDER_SITE_OTHER): Payer: Medicare Other | Admitting: Nurse Practitioner

## 2022-05-29 VITALS — BP 115/70 | HR 56 | Temp 98.1°F | Ht 67.0 in | Wt 148.5 lb

## 2022-05-29 DIAGNOSIS — R001 Bradycardia, unspecified: Secondary | ICD-10-CM | POA: Diagnosis not present

## 2022-05-29 DIAGNOSIS — Z23 Encounter for immunization: Secondary | ICD-10-CM | POA: Diagnosis not present

## 2022-05-29 DIAGNOSIS — E063 Autoimmune thyroiditis: Secondary | ICD-10-CM | POA: Diagnosis not present

## 2022-05-29 DIAGNOSIS — E440 Moderate protein-calorie malnutrition: Secondary | ICD-10-CM | POA: Diagnosis not present

## 2022-05-29 DIAGNOSIS — G629 Polyneuropathy, unspecified: Secondary | ICD-10-CM | POA: Diagnosis not present

## 2022-05-29 DIAGNOSIS — F419 Anxiety disorder, unspecified: Secondary | ICD-10-CM

## 2022-05-29 DIAGNOSIS — R251 Tremor, unspecified: Secondary | ICD-10-CM

## 2022-05-29 DIAGNOSIS — M85851 Other specified disorders of bone density and structure, right thigh: Secondary | ICD-10-CM

## 2022-05-29 MED ORDER — GABAPENTIN 300 MG PO CAPS: ORAL_CAPSULE | ORAL | 4 refills | Status: DC

## 2022-05-29 NOTE — Assessment & Plan Note (Signed)
Followed by cardiology in past, will have her return as needed.  HR apical today 60 and overall no symptoms presenting.

## 2022-05-29 NOTE — Assessment & Plan Note (Signed)
Post hospitalization with septic shock, worse at night.  Will continue Gabapentin 300 MG morning and noon, then 900 MG at night for discomfort, this is offering her benefit -- adjust as needed based on symptoms and renal function. Recheck labs today. Saw neurology and no changes made.

## 2022-05-29 NOTE — Assessment & Plan Note (Signed)
Is continuing to gain, has gained 4 pounds since recent visit and thyroid is stabilizing. Loss related to 4 month hospitalization and deconditioning.  Recommend continue lactose free supplements 3-4 times daily, as Boost caused nausea.  Ensure eating frequent, small, healthy meals.  Labs today + obtaining labs her [facility] has requested.

## 2022-05-29 NOTE — Assessment & Plan Note (Signed)
Chronic, stable.  Continue current medication regimen and adjust as needed.  She is gaining weight and overall improving.  Thyroid ultrasound completed and overall reassuring.  Continue collaboration with endo, will get repeat labs in office November 10th and send to endo.

## 2022-05-29 NOTE — Progress Notes (Signed)
BP 115/70   Pulse (!) 56   Temp 98.1 F (36.7 C) (Oral)   Ht '5\' 7"'$  (1.702 m)   Wt 148 lb 8 oz (67.4 kg)   SpO2 95%   BMI 23.26 kg/m    Subjective:    Patient ID: [name] Robinson, female    DOB: [birth year], 71 y.o.   MRN: [id number]  HPI: [name] Robinson is a 71 y.o. female  Chief Complaint  Patient presents with   Hypothyroidism   Weight Check   Peripheral Neuropathy   Medication Management    Patient says she is now taking 3 Gabapentin tablets at night and it works better for her.    Labs Only    Patient says her surgeon sent over some paperwork requesting patient to have some labs drawn at next visit.    HYPOTHYROIDISM Currently taking 50 MCG Levothyroxine. Saw endocrinology 05/12/22.  Thyroid ultrasound performed 02/23/22, no nodules -- consistent with Hashimoto's.  Continues to gain weight at this time and overall improvement post her lengthier hospitalization one year ago.  Needs labs drawn today for endo. Thyroid control status: stable Satisfied with current treatment? yes Medication side effects: no Medication compliance: good compliance Etiology of hypothyroidism: Hashimoto's Recent dose adjustment:yes Fatigue: no Cold intolerance: no Heat intolerance: no Weight gain: no Weight loss: no Constipation: no Diarrhea/loose stools: no Palpitations: none Lower extremity edema: no Anxiety/depressed mood: yes   NEUROPATHY Continues to have neuropathy pain to both feet.  Taking Gabapentin 300 MG in morning and afternoon + 900 MG at night.  Saw neurology on 04/20/22 who recommended she maintain current regimen and educated her on neuropathy post lengthier hospitalizations.  Her surgeon from [facility], Dr. Melba Coon, has requested multiple labs be drawn by PCP.  She last saw him 05/03/22. Neuropathy status: stable  Satisfied with current treatment?: yes Medication side effects: no Medication compliance:  good compliance Location: foot and lower legs Pain: tingling Severity:  mild  Quality:  tingling Frequency: constant Bilateral: yes Symmetric: yes Numbness: yes Decreased sensation: no Weakness: no Context: stable Alleviating factors: Gabapentin Aggravating factors: unknown Treatments attempted: Gabapentin  ANXIETY/STRESS Continues Celexa 20 MG and Buspar 15 MG TID + Trazodone at night.  Continues to have issues  with PTSD after 4 months in hospital in 2022.  Continues to visit with therapist regularly. Duration: stable Anxious mood: improved Excessive worrying: improved Irritability: improved Sweating: no Nausea: no Palpitations:no Hyperventilation: no Panic attacks: improving Agoraphobia: no  Obscessions/compulsions: no Depressed mood: improved    05/29/2022    1:29 PM 05/03/2022   10:11 AM 04/03/2022    2:18 PM 02/20/2022    2:28 PM 01/11/2022   11:17 AM  Depression screen PHQ 2/9  Decreased Interest 1 0 '1 2 3  '$ Down, Depressed, Hopeless 1 0 '1 2 3  '$ PHQ - 2 Score 2 0 '2 4 6  '$ Altered sleeping 1 0 0 0 2  Tired, decreased energy 2 0 '1 2 3  '$ Change in appetite 0 0 2 0 0  Feeling bad or failure about yourself  0 1 0 1 0  Trouble concentrating 0 0 1 1 0  Moving slowly or fidgety/restless '3 1 1 2 3  '$ Suicidal thoughts 0 0 0 0 0  PHQ-9 Score '8 2 7 10 14  '$ Difficult doing work/chores Somewhat difficult Not difficult at all Somewhat difficult Somewhat difficult Extremely dIfficult       05/29/2022    1:29 PM 04/03/2022    2:19 PM 02/20/2022  2:33 PM 01/11/2022   11:20 AM  GAD 7 : Generalized Anxiety Score  Nervous, Anxious, on Edge '1 1 2 3  '$ Control/stop worrying '1 1 1 3  '$ Worry too much - different things '1 1 1 3  '$ Trouble relaxing  '1 2 3  '$ Restless 0 0 2 3  Easily annoyed or irritable '1 2 1 3  '$ Afraid - awful might happen '1 2 2 3  '$ Total GAD 7 Score  '8 11 21  '$ Anxiety Difficulty Somewhat difficult Somewhat difficult Somewhat difficult Very difficult    Relevant past medical, surgical, family and social history reviewed and updated as indicated.  Interim medical history since our last visit reviewed. Allergies and medications reviewed and updated.  Review of Systems  Constitutional:  Negative for activity change, appetite change, diaphoresis, fatigue and fever.  Respiratory:  Negative for cough, chest tightness, shortness of breath and wheezing.   Cardiovascular:  Negative for chest pain, palpitations and leg swelling.  Gastrointestinal: Negative.   Endocrine: Negative for cold intolerance and heat intolerance.  Neurological: Negative.   Psychiatric/Behavioral: Negative.      Per HPI unless specifically indicated above     Objective:    BP 115/70   Pulse (!) 56   Temp 98.1 F (36.7 C) (Oral)   Ht '5\' 7"'$  (1.702 m)   Wt 148 lb 8 oz (67.4 kg)   SpO2 95%   BMI 23.26 kg/m   Wt Readings from Last 3 Encounters:  05/29/22 148 lb 8 oz (67.4 kg)  05/12/22 144 lb 3.2 oz (65.4 kg)  04/20/22 137 lb (62.1 kg)    Physical Exam Vitals and nursing note reviewed.  Constitutional:      General: She is awake. She is not in acute distress.    Appearance: She is well-developed and well-groomed. She is not ill-appearing or toxic-appearing.  HENT:     Head: Normocephalic.     Right Ear: Hearing normal.     Left Ear: Hearing normal.  Eyes:     General: Lids are normal.        Right eye: No discharge.        Left eye: No discharge.     Conjunctiva/sclera: Conjunctivae normal.     Pupils: Pupils are equal, round, and reactive to light.  Neck:     Thyroid: No thyromegaly.     Vascular: No carotid bruit.  Cardiovascular:     Rate and Rhythm: Regular rhythm. Bradycardia present.     Heart sounds: Normal heart sounds. No murmur heard.    No gallop.  Pulmonary:     Effort: Pulmonary effort is normal. No accessory muscle usage or respiratory distress.     Breath sounds: Normal breath sounds.  Abdominal:     General: Bowel sounds are normal.     Palpations: Abdomen is soft.  Musculoskeletal:     Cervical back: Normal range of motion  and neck supple.     Right lower leg: No edema.     Left lower leg: No edema.  Skin:    General: Skin is warm and dry.  Neurological:     Mental Status: She is alert and oriented to person, place, and time.  Psychiatric:        Attention and Perception: Attention normal.        Mood and Affect: Mood normal.        Speech: Speech normal.        Behavior: Behavior normal. Behavior is cooperative.  Thought Content: Thought content normal.    Results for orders placed or performed in visit on 03/22/22  TSH  Result Value Ref Range   TSH 12.500 (H) 0.450 - 4.500 uIU/mL  T4, free  Result Value Ref Range   Free T4 0.62 (L) 0.82 - 1.77 ng/dL  T3, free  Result Value Ref Range   T3, Free 1.5 (L) 2.0 - 4.4 pg/mL  Thyroid peroxidase antibody  Result Value Ref Range   Thyroperoxidase Ab SerPl-aCnc 162 (H) 0 - 34 IU/mL  Thyroglobulin antibody  Result Value Ref Range   Thyroglobulin Antibody 3.9 (H) 0.0 - 0.9 IU/mL      Assessment & Plan:   Problem List Items Addressed This Visit       Cardiovascular and Mediastinum   Bradycardia, sinus    Followed by cardiology in past, will have her return as needed.  HR apical today 60 and overall no symptoms presenting.        Endocrine   Hashimoto's thyroiditis    Chronic, stable.  Continue current medication regimen and adjust as needed.  She is gaining weight and overall improving.  Thyroid ultrasound completed and overall reassuring.  Continue collaboration with endo, will get repeat labs in office November 10th and send to endo.      Relevant Orders   T4, free   TSH     Nervous and Auditory   Neuropathy    Post hospitalization with septic shock, worse at night.  Will continue Gabapentin 300 MG morning and noon, then 900 MG at night for discomfort, this is offering her benefit -- adjust as needed based on symptoms and renal function. Recheck labs today. Saw neurology and no changes made.      Relevant Orders   Comprehensive  metabolic panel     Musculoskeletal and Integument   Osteopenia of femoral neck    Noted on DEXA April 2021, plan on repeat in 5 years and continue weekly Vitamin D supplement + adequate calcium intake.        Other   Anxiety    Chronic, improving with PTSD aspect present from lengthy hospitalization with septic shock.  Denies SI/HI.  Continue Celexa 20 MG daily and Buspar 15 MG TID + Trazodone.  Continue palliative collaboration + therapy.        Moderate protein-calorie malnutrition ([place]) - Primary    Is continuing to gain, has gained 4 pounds since recent visit and thyroid is stabilizing. Loss related to 4 month hospitalization and deconditioning.  Recommend continue lactose free supplements 3-4 times daily, as Boost caused nausea.  Ensure eating frequent, small, healthy meals.  Labs today + obtaining labs her [facility] has requested.      Relevant Orders   Comprehensive metabolic panel   Lipid Panel w/o Chol/HDL Ratio   CBC with Differential/Platelet   Magnesium   Vitamin B1   Folate   Ferritin   Vitamin A   Vitamin E   Vitamin K1, Serum   PTH, intact and calcium   Iron Binding Cap (TIBC)([facility])   Tremor    Ongoing, intermittent to body, upper more then lower since sepsis.  Anxiety has overall improved, tremor mildly remains intermittently.  Saw neurology with no changes made.      Relevant Orders   Comprehensive metabolic panel   Other Visit Diagnoses     Flu vaccine need       Flu vaccine in office today.   Relevant Orders   Flu Vaccine  QUAD High Dose(Fluad) (Completed)       Follow up plan: Return in about 3 months (around 08/29/2022) for Needs lab visit November 10th + 3 month follow-up with me.

## 2022-05-29 NOTE — Assessment & Plan Note (Signed)
Chronic, improving with PTSD aspect present from lengthy hospitalization with septic shock.  Denies SI/HI.  Continue Celexa 20 MG daily and Buspar 15 MG TID + Trazodone.  Continue palliative collaboration + therapy.

## 2022-05-29 NOTE — Assessment & Plan Note (Signed)
Noted on DEXA April 2021, plan on repeat in 5 years and continue weekly Vitamin D supplement + adequate calcium intake.

## 2022-05-29 NOTE — Assessment & Plan Note (Signed)
Ongoing, intermittent to body, upper more then lower since sepsis.  Anxiety has overall improved, tremor mildly remains intermittently.  Saw neurology with no changes made.

## 2022-05-30 NOTE — Progress Notes (Signed)
Contacted via MyChart   Good evening [name] Robinson, not all labs that surgeon wanted are back quite yet, but current labs show: - Kidney function, creatinine and eGFR, remains normal, as is liver function, AST and ALT.   - Hemoglobin and hematocrit a little low again, may benefit returning to hematology in future if this trends down.  Ferritin a little elevated too. Are you taking supplements? - Remainder of current labs stable.  Will let you know when rest return so you can alert surgeon:) Any questions? Keep being awesome!!  Thank you for allowing me to participate in your care.  I appreciate you. Kindest regards, Sundus Pete

## 2022-06-02 LAB — PTH, INTACT AND CALCIUM: PTH: 23 pg/mL (ref 15–65)

## 2022-06-02 LAB — FOLATE: Folate: 16.9 ng/mL (ref >3.0)

## 2022-06-02 LAB — CBC WITH DIFFERENTIAL/PLATELET
Basophils Absolute: 0 10*3/uL (ref 0.0–0.2)
Basos: 1 %
EOS (ABSOLUTE): 0 10*3/uL (ref 0.0–0.4)
Eos: 0 %
Hematocrit: 33.5 % — ABNORMAL LOW (ref 34.0–46.6)
Hemoglobin: 11 g/dL — ABNORMAL LOW (ref 11.1–15.9)
Immature Grans (Abs): 0 10*3/uL (ref 0.0–0.1)
Immature Granulocytes: 0 %
Lymphocytes Absolute: 1.2 10*3/uL (ref 0.7–3.1)
Lymphs: 37 %
MCH: 30.9 pg (ref 26.6–33.0)
MCHC: 32.8 g/dL (ref 31.5–35.7)
MCV: 94 fL (ref 79–97)
Monocytes Absolute: 0.2 10*3/uL (ref 0.1–0.9)
Monocytes: 6 %
Neutrophils Absolute: 1.8 10*3/uL (ref 1.4–7.0)
Neutrophils: 56 %
Platelets: 149 10*3/uL — ABNORMAL LOW (ref 150–450)
RBC: 3.56 x10E6/uL — ABNORMAL LOW (ref 3.77–5.28)
RDW: 12.7 % (ref 11.7–15.4)
WBC: 3.2 10*3/uL — ABNORMAL LOW (ref 3.4–10.8)

## 2022-06-02 LAB — IRON AND TIBC
Iron Saturation: 35 % (ref 15–55)
Iron: 88 ug/dL (ref 27–139)
Total Iron Binding Capacity: 251 ug/dL (ref 250–450)
UIBC: 163 ug/dL (ref 118–369)

## 2022-06-02 LAB — LIPID PANEL W/O CHOL/HDL RATIO
Cholesterol, Total: 177 mg/dL (ref 100–199)
HDL: 80 mg/dL (ref >39)
LDL Chol Calc (NIH): 74 mg/dL (ref 0–99)
Triglycerides: 134 mg/dL (ref 0–149)
VLDL Cholesterol Cal: 23 mg/dL (ref 5–40)

## 2022-06-02 LAB — COMPREHENSIVE METABOLIC PANEL
ALT: 32 IU/L (ref 0–32)
AST: 26 IU/L (ref 0–40)
Albumin/Globulin Ratio: 1.9 (ref 1.2–2.2)
Albumin: 4.3 g/dL (ref 3.9–4.9)
Alkaline Phosphatase: 89 IU/L (ref 44–121)
BUN/Creatinine Ratio: 25 (ref 12–28)
BUN: 15 mg/dL (ref 8–27)
Bilirubin Total: 0.4 mg/dL (ref 0.0–1.2)
CO2: 28 mmol/L (ref 20–29)
Calcium: 9.4 mg/dL (ref 8.7–10.3)
Chloride: 103 mmol/L (ref 96–106)
Creatinine, Ser: 0.6 mg/dL (ref 0.57–1.00)
Globulin, Total: 2.3 g/dL (ref 1.5–4.5)
Glucose: 55 mg/dL — ABNORMAL LOW (ref 70–99)
Potassium: 3.8 mmol/L (ref 3.5–5.2)
Sodium: 142 mmol/L (ref 134–144)
Total Protein: 6.6 g/dL (ref 6.0–8.5)
eGFR: 97 mL/min/{1.73_m2} (ref >59)

## 2022-06-02 LAB — VITAMIN E
Vitamin E (Alpha Tocopherol): 11.4 mg/L (ref 9.0–29.0)
Vitamin E(Gamma Tocopherol): 1.4 mg/L (ref 0.5–4.9)

## 2022-06-02 LAB — VITAMIN B1: Thiamine: 95.1 nmol/L (ref 66.5–200.0)

## 2022-06-02 LAB — VITAMIN K1, SERUM: VITAMIN K1: 0.42 ng/mL (ref 0.10–2.20)

## 2022-06-02 LAB — VITAMIN A: Vitamin A: 40.9 ug/dL (ref 22.0–69.5)

## 2022-06-02 LAB — FERRITIN: Ferritin: 789 ng/mL — ABNORMAL HIGH (ref 15–150)

## 2022-06-02 LAB — MAGNESIUM: Magnesium: 2.1 mg/dL (ref 1.6–2.3)

## 2022-06-02 NOTE — Progress Notes (Signed)
Contacted via [place]  Let surgeon know all labs have returned and he should be able to view them under Care Everywhere.  Everything looks great!!

## 2022-06-09 ENCOUNTER — Ambulatory Visit (INDEPENDENT_AMBULATORY_CARE_PROVIDER_SITE_OTHER): Payer: Medicare Other | Admitting: Physician Assistant

## 2022-06-09 ENCOUNTER — Encounter: Payer: Self-pay | Admitting: Physician Assistant

## 2022-06-09 VITALS — BP 116/65 | HR 58 | Temp 98.4°F | Wt 147.0 lb

## 2022-06-09 DIAGNOSIS — M545 Low back pain, unspecified: Secondary | ICD-10-CM | POA: Diagnosis not present

## 2022-06-09 NOTE — Patient Instructions (Addendum)
Based on your symptoms and physical exam I believe the following is the cause of your concern today Back pain likely secondary to a strain of your back muscles  I recommend the following at this time to help relieve that discomfort:  Rest Warm compresses to the area (20 minutes on, minimum of 30 minutes off) You can alternate Tylenol and Ibuprofen for pain management but Ibuprofen is typically preferred to reduce inflammation.   You can take up to 3500 mg of Tylenol (acetaminophen) per day in divided doses - like 800 mg three times per day You can take up to 3200 mg of Ibuprofen (Motrin) per day in divided doses - like 600 mg three times per day  Make sure you take these with plenty of water and a bit of food to prevent stomach upset   Gentle stretches and exercises that I have included in your paperwork Try to reduce excess strain to the area and rest as much as possible  Wear supportive shoes and, if you must lift anything, use proper lifting techniques that spare your back.   If these measures do not lead to improvement in your symptoms over the next 2-4 weeks please let us know    It was nice to meet you and I appreciate the opportunity to be involved in your care If you were satisfied with the care you received from me, I would greatly appreciate you saying so in the after-visit survey that is sent out following our visit.

## 2022-06-09 NOTE — Progress Notes (Unsigned)
Acute Office Visit   Patient: [name] Robinson   DOB: 1951/03/23   71 y.o. Female  MRN: [id number] Visit Date: 06/09/2022  Today's healthcare provider: Dani Gobble Burnell Matlin, PA-C  Introduced myself to the patient as a Journalist, newspaper and provided education on APPs in clinical practice.    Chief Complaint  Patient presents with   Back Pain    Pt states he has been having bilateral low back pain for the past 2 weeks. States she tried to pick her husband up off of the floor and felt her back pull.    Subjective    Back Pain Pertinent negatives include no numbness or weakness.   HPI     Back Pain    Additional comments: Pt states he has been having bilateral low back pain for the past 2 weeks. States she tried to pick her husband up off of the floor and felt her back pull.       Last edited by Georgina Peer, CMA on 06/09/2022  9:47 AM.       Acute low back pain Reports she hurt her lower back  Tried to lift her husband off the floor 2 weeks ago Reports it is a throbbing pain  Pain level: 8/10 throbbing, constant Alleviating: some relief with OTC pain reliever  Aggravating: Bending  Interventions: OTC pain relief- unsure if it is Tylenol or Ibuprofen, heating pad  Reports she has been laying in bed a lot to try to relieve it  Denies saddle anesthesia, bowel or bladder incontinence, sciatic radiation, numbness or tingling in legs or feet     Medications: Outpatient Medications Prior to Visit  Medication Sig   busPIRone (BUSPAR) 15 MG tablet Take by mouth.   citalopram (CELEXA) 20 MG tablet Take 20 mg by mouth daily.   ergocalciferol (VITAMIN D2) 1.25 MG (50000 UT) capsule Take 1 capsule (50,000 Units total) by mouth once a week.   gabapentin (NEURONTIN) 300 MG capsule Take one tablet (300 MG) by mouth in the morning and at noon, then 900 MG (three tablets) by mouth at night.   levothyroxine (SYNTHROID) 50 MCG tablet Take 1 tablet (50 mcg total) by mouth daily before  breakfast.   Multiple Vitamin (MULTIVITAMIN) capsule Take 1 capsule by mouth daily.   traZODone (DESYREL) 50 MG tablet Take 2 tablets (100 mg total) by mouth at bedtime as needed.   vitamin B-12 (CYANOCOBALAMIN) 1000 MCG tablet Take 1,000 mcg by mouth daily.   No facility-administered medications prior to visit.    Review of Systems  Musculoskeletal:  Positive for back pain and myalgias. Negative for gait problem.  Neurological:  Negative for weakness and numbness.    {Labs  Heme  Chem  Endocrine  Serology  Results Review (optional):23779}   Objective    BP 116/65   Pulse (!) 58   Temp 98.4 F (36.9 C) (Oral)   Wt 147 lb (66.7 kg)   SpO2 98%   BMI 23.02 kg/m  {Show previous vital signs (optional):23777}  Physical Exam Vitals reviewed.  Constitutional:      General: She is awake.     Appearance: Normal appearance. She is well-developed and well-groomed.  HENT:     Head: Normocephalic and atraumatic.  Musculoskeletal:     Cervical back: No swelling, edema, deformity, rigidity, tenderness or bony tenderness. No pain with movement. Normal range of motion.     Thoracic back: No swelling, edema, spasms, tenderness or bony  tenderness. Normal range of motion.     Lumbar back: Tenderness present. No swelling, edema, deformity, spasms or bony tenderness. Decreased range of motion. Negative right straight leg raise test and negative left straight leg raise test.       Back:     Comments: Left Lateral flexion mildly reduced compared to right Extension reduced  Forward flexion reduced but unsure if this is due to injury vs age related decreased flexibility Lateral rotation intact   Neurological:     Mental Status: She is alert.  Psychiatric:        Behavior: Behavior is cooperative.       No results found for any visits on 06/09/22.  Assessment & Plan      No follow-ups on file.

## 2022-06-13 ENCOUNTER — Other Ambulatory Visit: Payer: Self-pay | Admitting: Nurse Practitioner

## 2022-06-13 ENCOUNTER — Encounter: Payer: Self-pay | Admitting: Nurse Practitioner

## 2022-06-13 MED ORDER — ERGOCALCIFEROL 1.25 MG (50000 UT) PO CAPS: 50000.0000 [IU] | ORAL_CAPSULE | ORAL | 5 refills | Status: DC

## 2022-06-20 ENCOUNTER — Ambulatory Visit
Admission: RE | Admit: 2022-06-20 | Discharge: 2022-06-20 | Disposition: A | Discharge status: Home / Self Care | Payer: Medicare Other | Source: Ambulatory Visit | Attending: Nurse Practitioner | Admitting: Nurse Practitioner

## 2022-06-20 DIAGNOSIS — Z1231 Encounter for screening mammogram for malignant neoplasm of breast: Secondary | ICD-10-CM | POA: Insufficient documentation

## 2022-06-21 NOTE — Progress Notes (Signed)
Contacted via MyChart   Normal mammogram, may repeat in one year:)

## 2022-06-23 ENCOUNTER — Other Ambulatory Visit: Payer: Medicare Other

## 2022-06-23 DIAGNOSIS — E063 Autoimmune thyroiditis: Secondary | ICD-10-CM

## 2022-06-24 LAB — T4, FREE: Free T4: 0.94 ng/dL (ref 0.82–1.77)

## 2022-06-24 LAB — TSH: TSH: 14.5 u[IU]/mL — ABNORMAL HIGH (ref 0.450–4.500)

## 2022-06-26 ENCOUNTER — Other Ambulatory Visit: Payer: Self-pay | Admitting: Nurse Practitioner

## 2022-06-26 ENCOUNTER — Other Ambulatory Visit: Payer: Medicare Other | Admitting: Primary Care

## 2022-06-26 DIAGNOSIS — G629 Polyneuropathy, unspecified: Secondary | ICD-10-CM

## 2022-06-26 DIAGNOSIS — Z515 Encounter for palliative care: Secondary | ICD-10-CM

## 2022-06-26 DIAGNOSIS — R29898 Other symptoms and signs involving the musculoskeletal system: Secondary | ICD-10-CM

## 2022-06-26 DIAGNOSIS — E038 Other specified hypothyroidism: Secondary | ICD-10-CM

## 2022-06-26 MED ORDER — LEVOTHYROXINE SODIUM 75 MCG PO TABS: 75.0000 ug | ORAL_TABLET | Freq: Every day | ORAL | 1 refills | Status: DC

## 2022-06-26 NOTE — Progress Notes (Signed)
Patient was called and given the results per Whitney. Patient states that she had already read the results earlier this morning. She will get her lab work in January, about 1 week prior to her appointment.

## 2022-06-26 NOTE — Progress Notes (Signed)
Please call patient and let her know her thyroid labs still show under-replacement.  Need to increase her Levothyroxine to 75 mcg po daily before breakfast.  I will send new script to pharmacy on file.  We will also need to repeat her thyroid labs again prior to the visit with me in January to see if the increase in the dose has gotten her to goal.

## 2022-06-26 NOTE — Progress Notes (Signed)
Thank you!  Ill reach out to her with further instructions.

## 2022-06-26 NOTE — Progress Notes (Signed)
[facility] Consult Note Telephone: [phone]  Fax: [phone]    Date of encounter: 06/26/22 11:08 AM PATIENT NAME: [name] Robinson [address] [place] [facility] [address]   [phone] (home)  DOB: 1951/07/14 MRN: [id number] PRIMARY CARE PROVIDER:    Venita Lick, NP,  [place] [address] [phone]  REFERRING PROVIDER:   Venita Lick, NP [address] [place],  [place] [address] [phone]  RESPONSIBLE PARTY:    Contact Information     Name Relation Home Work Mobile   Brandelyn, Henne Spouse   [phone]   Jakia Kennebrew,Jennifer Daughter   [phone]       I met face to face with patient and family in  home. Palliative Care was asked to follow this patient by consultation request of  Venita Lick, NP to address advance care planning and complex medical decision making. This is a follow up visit.                                   ASSESSMENT AND PLAN / RECOMMENDATIONS:   Advance Care Planning/Goals of Care: Goals include to maximize quality of life and symptom management. Patient/health care surrogate gave his/her permission to discuss. Our advance care planning conversation included a discussion about:    Exploration of personal, cultural or spiritual beliefs that might influence medical decisions  Exploration of goals of care in the event of a sudden injury or illness  Identification of a healthcare agent- husband CODE STATUS: FULL  Symptom Management/Plan:  Mood; Improving but still has crying spells. I would recommend she being changed from celexa  to duloxetine. Recommend she change to address improving mood as well as neuropathic pain. Getting counseling and feels it's helping.   Mobility:  able to ambulate without device. Is driving long distance now.   Hypothyroid: Following up with endocrine now. Recent TSH increasing. Newly increased to 75 mcg every am.  Endorses gaining weight, has less  tremors.  Nutrition: Improved and gaining weight now.   Follow up Palliative Care Visit: Palliative care to d/c due to no longer having home NP visit program. Goals met, increased function and recovery.   I spent 60 minutes providing this consultation. More than 50% of the time in this consultation was spent in counseling and care coordination.  PPS: 60%  HOSPICE ELIGIBILITY/DIAGNOSIS: TBD  Chief Complaint: depression  HISTORY OF PRESENT ILLNESS:  [name] Robinson is a 71 y.o. 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   History obtained from review of EMR, discussion with primary team, and interview with family, facility staff/caregiver and/or Ms. Hoe.  I reviewed available labs, medications, imaging, studies and related documents from the EMR.  Records reviewed and summarized above.   ROS  General: NAD EYES: denies vision changes, has glasses ENMT: denies dysphagia Cardiovascular: denies chest pain, denies DOE Pulmonary: denies cough, denies increased SOB Abdomen: endorses good appetite, denies constipation, endorses continence of bowel GU: denies dysuria, endorses continence of urine MSK:  denies increased weakness, no falls reported Skin: denies rashes or wounds Neurological: endorses occ pain, denies insomnia Psych: Endorses positive mood, however has ongoing trauma from hospital course Heme/lymph/immuno: denies bruises, abnormal bleeding  Physical Exam: Current and past weights: 148 lbs,  Constitutional: NAD General: frail appearing, WNWD EYES: anicteric sclera, lids intact, no discharge  ENMT: intact hearing, oral mucous membranes moist,  dentition intact CV: no LE edema Pulmonary:no increased work of breathing, no cough, room air Abdomen: intake 100%, soft and non tender, no ascites GU: deferred MSK: mild  sarcopenia, moves all extremities, ambulatory with occ  use of cane. Skin: warm and dry, no rashes or  wounds on visible skin Neuro:  no  new generalized weakness,  no cognitive impairment Psych: slight anxious affect, A and O x 3 Hem/lymph/immuno: no widespread bruising   Thank you for the opportunity to participate in the care of Ms. Amero. Please call our office at [phone] if we can be of additional assistance.   Jason Coop, NP   COVID-19 PATIENT SCREENING TOOL Asked and negative response unless otherwise noted:   Have you had symptoms of covid, tested positive or been in contact with someone with symptoms/positive test in the past 5-10 days?

## 2022-07-21 ENCOUNTER — Encounter: Payer: Self-pay | Admitting: Nurse Practitioner

## 2022-07-21 DIAGNOSIS — E063 Autoimmune thyroiditis: Secondary | ICD-10-CM

## 2022-08-08 ENCOUNTER — Other Ambulatory Visit: Payer: Medicare Other

## 2022-08-08 DIAGNOSIS — E063 Autoimmune thyroiditis: Secondary | ICD-10-CM

## 2022-08-09 LAB — T4, FREE: Free T4: 1.28 ng/dL (ref 0.82–1.77)

## 2022-08-09 LAB — TSH: TSH: 3.77 u[IU]/mL (ref 0.450–4.500)

## 2022-08-09 NOTE — Progress Notes (Signed)
Thank you :)

## 2022-08-09 NOTE — Progress Notes (Signed)
Good morning, thyroid labs looking great -- will send these to [facility]:)

## 2022-08-10 NOTE — Patient Instructions (Signed)

## 2022-08-15 ENCOUNTER — Encounter: Payer: Self-pay | Admitting: Nurse Practitioner

## 2022-08-15 ENCOUNTER — Ambulatory Visit (INDEPENDENT_AMBULATORY_CARE_PROVIDER_SITE_OTHER): Payer: Medicare HMO | Admitting: Nurse Practitioner

## 2022-08-15 VITALS — BP 138/82 | HR 58 | Ht 67.0 in | Wt 154.5 lb

## 2022-08-15 DIAGNOSIS — E063 Autoimmune thyroiditis: Secondary | ICD-10-CM | POA: Diagnosis not present

## 2022-08-15 DIAGNOSIS — E038 Other specified hypothyroidism: Secondary | ICD-10-CM | POA: Diagnosis not present

## 2022-08-15 MED ORDER — LEVOTHYROXINE SODIUM 88 MCG PO TABS: 88.0000 ug | ORAL_TABLET | Freq: Every day | ORAL | 1 refills | Status: DC

## 2022-08-15 NOTE — Progress Notes (Signed)
Endocrinology Follow Up Note                                         08/15/2022, 1:16 PM  Subjective:   Subjective    [name] Robinson is a 72 y.o.-year-old female patient being seen in follow up after being seen in consultation for hypothyroidism referred by Venita Lick, NP.   Past Medical History:  Diagnosis Date   Hypothyroidism    Morbid obesity ([place]) 11/2019   s/p Barriatric Sgx ([facility])   Sleep apnea    doesn't use CPAP, lost weight, not needed    Past Surgical History:  Procedure Laterality Date   ABDOMINAL HYSTERECTOMY     BARIATRIC SURGERY N/A 01/02/2020   duodenal switch   CERVICAL DISC SURGERY N/A    discectomy   COLONOSCOPY     ESOPHAGOGASTRODUODENOSCOPY  11/06/2020   EYE SURGERY Bilateral    laser procedure   LAPAROSCOPIC COLON RESECTION     MASS EXCISION Left 04/13/2021   Procedure: EXCISION MASS, left lateral thigh;  Surgeon: Ronny Bacon, MD;  Location: [facility] ORS;  Service: General;  Laterality: Left;   SHOULDER SURGERY Left    torn cartilege   TONSILLECTOMY N/A    TOTAL HIP ARTHROPLASTY Right 11/02/2020   TRANSTHORACIC ECHOCARDIOGRAM  07/14/2019   a) [facility]) Technically difficult.-Used Definity.  Nl LV Fxn -EF 55 - 60%.  No obvious WMA.  Mild MR.  No effusion;; b) Echo 04/16/2021-[facility]: (Fair quality) normal LV size and function.  EF 55-60%.  No R WMA.  Normal diastolic parameters.  Mild MR.  Unable to assess RVSP.    Social History   Socioeconomic History   Marital status: Married    Spouse name: [name]   Number of children: 2   Years of education: Not on file   Highest education level: Some college, no degree  Occupational History   Not on file  Tobacco Use   Smoking status: Never   Smokeless tobacco: Never  Vaping Use   Vaping Use: Never used  Substance and Sexual Activity   Alcohol use: Never   Drug use: Never   Sexual activity: Not Currently  Other  Topics Concern   Not on file  Social History Narrative   04/20/22 She is retired, lives with husband.    Otherwise, she is usually on the go, not sedentary.   Social Determinants of Health   Financial Resource Strain: Low Risk  (05/03/2022)   Overall Financial Resource Strain (CARDIA)    Difficulty of Paying Living Expenses: Not hard at all  Food Insecurity: No Food Insecurity (05/03/2022)   Hunger Vital Sign    Worried About Running Out of Food in the Last Year: Never true    Ran Out of Food in the Last Year: Never true  Transportation Needs: No Transportation Needs (05/03/2022)   PRAPARE - Hydrologist (Medical): No    Lack of Transportation (Non-Medical): No  Physical Activity: Insufficiently Active (05/03/2022)  Exercise Vital Sign    Days of Exercise per Week: 4 days    Minutes of Exercise per Session: 30 min  Stress: No Stress Concern Present (05/03/2022)   [place]    Feeling of Stress : Not at all  Social Connections: [place] (05/03/2022)   Social Connection and Isolation Panel [NHANES]    Frequency of Communication with Friends and Family: More than three times a week    Frequency of Social Gatherings with Friends and Family: Twice a week    Attends Religious Services: More than 4 times per year    Active Member of [employer] or Organizations: Yes    Attends Music therapist: More than 4 times per year    Marital Status: Married    Family History  Problem Relation Age of Onset   Thyroid disease Mother    Cancer - Lung Father    Cancer - Lung Brother    Cancer Brother     Outpatient Encounter Medications as of 08/15/2022  Medication Sig   busPIRone (BUSPAR) 15 MG tablet Take by mouth 2 (two) times daily.   citalopram (CELEXA) 20 MG tablet Take 20 mg by mouth daily.   ergocalciferol (VITAMIN D2) 1.25 MG (50000 UT) capsule Take 1 capsule (50,000 Units total)  by mouth once a week.   gabapentin (NEURONTIN) 300 MG capsule Take one tablet (300 MG) by mouth in the morning and at noon, then 900 MG (three tablets) by mouth at night. (Patient taking differently: Take one tablet (300 MG) by mouth in the morning and at noon, then 600 MG (two tablets) by mouth at night.)   Multiple Vitamin (MULTIVITAMIN) capsule Take 1 capsule by mouth daily.   traZODone (DESYREL) 50 MG tablet Take 2 tablets (100 mg total) by mouth at bedtime as needed.   vitamin B-12 (CYANOCOBALAMIN) 1000 MCG tablet Take 1,000 mcg by mouth daily.   [DISCONTINUED] levothyroxine (SYNTHROID) 75 MCG tablet Take 1 tablet (75 mcg total) by mouth daily before breakfast.   levothyroxine (SYNTHROID) 88 MCG tablet Take 1 tablet (88 mcg total) by mouth daily before breakfast.   No facility-administered encounter medications on file as of 08/15/2022.    ALLERGIES: Allergies  Allergen Reactions   Solifenacin Rash    Patient had bad rash with this med.   VACCINATION STATUS: Immunization History  Administered Date(s) Administered   Fluad Quad(high Dose 65+) 05/20/2021, 05/29/2022   Influenza Inj Mdck Quad Pf 05/29/2017   Influenza, High Dose Seasonal PF 05/17/2018, 05/07/2019   Influenza,inj,Quad PF,6+ Mos 06/18/2015, 05/19/2016, 05/07/2019   Influenza-Unspecified 05/29/2017, 06/16/2020   PFIZER(Purple Top)SARS-COV-2 Vaccination 10/11/2019, 11/05/2019   PPD Test 01/04/2021   Pneumococcal Conjugate-13 05/07/2019   Td 02/28/2021   Tdap 06/21/2003     HPI   [name] Robinson  is a patient with the above medical history. she was diagnosed with hypothyroidism years ago, which required subsequent initiation of thyroid hormone replacement. she was given various doses of Levothyroxine over the years, currently on 25 micrograms. she reports compliance to this medication:  Taking it daily on empty stomach  with water, separated by >30 minutes before breakfast and other medications , and by at least 4 hours  from calcium, iron, PPIs, multivitamins.  She notes she was hospitalized for sepsis last year and has since developed a constant tremor in which she has been trying to solve.  She weaned herself off Fentanyl and that did not help her tremors.  I reviewed patient's thyroid tests:  Lab Results  Component Value Date   TSH 3.770 08/08/2022   TSH 14.500 (H) 06/23/2022   TSH 12.500 (H) 04/07/2022   TSH 3.590 02/15/2022   TSH 0.056 (L) 12/14/2021   TSH 0.237 (L) 11/28/2021   TSH 4.220 11/09/2021   TSH 1.250 02/28/2021   FREET4 1.28 08/08/2022   FREET4 0.94 06/23/2022   FREET4 0.62 (L) 04/07/2022   FREET4 1.00 02/15/2022   FREET4 1.84 (H) 12/14/2021   FREET4 1.24 (H) 11/28/2021   FREET4 1.08 02/28/2021     Pt describes: - weight loss - tremors  Pt denies feeling nodules in neck, hoarseness, dysphagia/odynophagia, SOB with lying down.  she denies family history of thyroid disorders.  No family history of thyroid cancer.  No history of radiation therapy to head or neck.  No recent use of iodine supplements.  Denies use of Biotin containing supplements.  I reviewed her chart and she also has a history of neuropathy, vitamin d deficiency, cardiomyopathy, pernicious anemia, bariatric surgery.   ROS:  Constitutional: + weight gain (regained some previously lost unintentionally), + fatigue, no subjective hyperthermia, + subjective hypothermia Eyes: no blurry vision, no xerophthalmia ENT: no sore throat, no nodules palpated in throat, no dysphagia/odynophagia, no hoarseness Cardiovascular: no chest pain, no SOB, no palpitations, no leg swelling Respiratory: no cough, no SOB Gastrointestinal: no nausea/vomiting/diarrhea Musculoskeletal: no muscle/joint aches, walks with cane Skin: no rashes Neurological: + full body tremors-improving under neurology care, no numbness, no tingling, no dizziness Psychiatric: no depression, no anxiety   Objective:   Objective     BP 138/82 (BP  Location: Left Arm, Patient Position: Sitting, Cuff Size: Large)   Pulse (!) 58   Ht '5\' 7"'$  (1.702 m)   Wt 154 lb 8 oz (70.1 kg)   BMI 24.20 kg/m  Wt Readings from Last 3 Encounters:  08/15/22 154 lb 8 oz (70.1 kg)  06/09/22 147 lb (66.7 kg)  05/29/22 148 lb 8 oz (67.4 kg)    BP Readings from Last 3 Encounters:  08/15/22 138/82  06/09/22 116/65  05/29/22 115/70      Physical Exam- Limited  Constitutional:  Body mass index is 24.2 kg/m. , not in acute distress, normal state of mind Eyes:  EOMI, no exophthalmos Musculoskeletal: no gross deformities, strength intact in all four extremities, no gross restriction of joint movements Skin:  no rashes, no hyperemia Neurological: no tremor with outstretched hands   CMP ( most recent) CMP     Component Value Date/Time   NA 142 05/29/2022 1352   K 3.8 05/29/2022 1352   CL 103 05/29/2022 1352   CO2 28 05/29/2022 1352   GLUCOSE 55 (L) 05/29/2022 1352   GLUCOSE 156 (H) 06/10/2021 0615   BUN 15 05/29/2022 1352   CREATININE 0.60 05/29/2022 1352   CALCIUM 9.4 05/29/2022 1352   PROT 6.6 05/29/2022 1352   ALBUMIN 4.3 05/29/2022 1352   AST 26 05/29/2022 1352   ALT 32 05/29/2022 1352   ALKPHOS 89 05/29/2022 1352   BILITOT 0.4 05/29/2022 1352   GFRNONAA >60 06/10/2021 0615     Diabetic Labs (most recent): No results found for: "HGBA1C", "MICROALBUR"   Lipid Panel ( most recent) Lipid Panel     Component Value Date/Time   CHOL 177 05/29/2022 1352   TRIG 134 05/29/2022 1352   HDL 80 05/29/2022 1352   LDLCALC 74 05/29/2022 1352   LABVLDL 23 05/29/2022 1352       Lab  Results  Component Value Date   TSH 3.770 08/08/2022   TSH 14.500 (H) 06/23/2022   TSH 12.500 (H) 04/07/2022   TSH 3.590 02/15/2022   TSH 0.056 (L) 12/14/2021   TSH 0.237 (L) 11/28/2021   TSH 4.220 11/09/2021   TSH 1.250 02/28/2021   FREET4 1.28 08/08/2022   FREET4 0.94 06/23/2022   FREET4 0.62 (L) 04/07/2022   FREET4 1.00 02/15/2022   FREET4 1.84  (H) 12/14/2021   FREET4 1.24 (H) 11/28/2021   FREET4 1.08 02/28/2021     Thyroid US from 02/23/22 CLINICAL DATA:  Other.  Hashimoto's thyroiditis   EXAM: THYROID ULTRASOUND   TECHNIQUE: Ultrasound examination of the thyroid gland and adjacent soft tissues was performed.   COMPARISON:  None Available.   FINDINGS: Parenchymal Echotexture: Markedly heterogenous   Isthmus: 0.2 cm   Right lobe: 5.4 x 3.1 x 2.6 cm   Left lobe: 5.8 x 3.2 x 2.4 cm   _________________________________________________________   Estimated total number of nodules >/= 1 cm: 0   Number of spongiform nodules >/=  2 cm not described below (TR1): 0   Number of mixed cystic and solid nodules >/= 1.5 cm not described below (TR2): 0   _________________________________________________________   No discrete nodules are seen within the thyroid gland.   IMPRESSION: 1. Diffusely enlarged and heterogeneous thyroid gland. The imaging appearance is consistent with the clinical history of Hashimoto's thyroiditis. 2. No discrete thyroid nodules are visualized.     Electronically Signed   By: Jacqulynn Cadet M.D.   On: 02/24/2022 16:12   Latest Reference Range & Units 11/28/21 16:19 12/14/21 11:13 02/15/22 13:56 04/07/22 11:58 06/23/22 09:29 08/08/22 09:06  TSH 0.450 - 4.500 uIU/mL 0.237 (L) 0.056 (L) 3.590 12.500 (H) 14.500 (H) 3.770  Triiodothyronine,Free,Serum 2.0 - 4.4 pg/mL    1.5 (L)    T4,Free(Direct) 0.82 - 1.77 ng/dL 1.24 (H) 1.84 (H) 1.00 0.62 (L) 0.94 1.28  Thyroperoxidase Ab SerPl-aCnc 0 - 34 IU/mL    162 (H)    Thyroglobulin Antibody 0.0 - 0.9 IU/mL    3.9 (H)    (L): Data is abnormally low (H): Data is abnormally high  Assessment & Plan:   ASSESSMENT / PLAN:  1. Hypothyroidism-r/t Hashimotos thyroiditis.   Patient with long-standing hypothyroidism, on levothyroxine therapy. On physical exam , patient  does not  have  gross goiter, thyroid nodules, or neck compression symptoms.   Positive antibody testing confirms suspicion of autoimmune thyroid disease.  Her previsit thyroid function tests are consistent with slight under-replacement.  She is advised to increase her dose of Levothyroxine to 88 mcg po daily before breakfast.  - We discussed about correct intake of levothyroxine, at fasting, with water, separated by at least 30 minutes from breakfast, and separated by more than 4 hours from calcium, iron, multivitamins, acid reflux medications (PPIs). -Patient is made aware of the fact that thyroid hormone replacement is needed for life, dose to be adjusted by periodic monitoring of thyroid function tests.  -She recently had thyroid US on 7/13 which showed enlarged thyroid with heterogeneous tissue and no nodularity.    I spent 20 minutes in the care of the patient today including review of labs from Thyroid Function, CMP, and other relevant labs ; imaging/biopsy records (current and previous including abstractions from other facilities); face-to-face time discussing  her lab results and symptoms, medications doses, her options of short and long term treatment based on the latest standards of care / guidelines;   and  documenting the encounter.  [name]  participated in the discussions, expressed understanding, and voiced agreement with the above plans.  All questions were answered to her satisfaction. she is encouraged to contact clinic should she have any questions or concerns prior to her return visit.   FOLLOW UP PLAN:  Return in about 3 months (around 11/14/2022) for Thyroid follow up, Previsit labs.  Rayetta Pigg, [facility] [address] [place], [place] [address] Phone: [phone] Fax: [phone]  08/15/2022, 1:16 PM

## 2022-08-27 NOTE — Patient Instructions (Signed)

## 2022-08-29 ENCOUNTER — Ambulatory Visit (INDEPENDENT_AMBULATORY_CARE_PROVIDER_SITE_OTHER): Payer: Medicare HMO | Admitting: Nurse Practitioner

## 2022-08-29 ENCOUNTER — Encounter: Payer: Self-pay | Admitting: Nurse Practitioner

## 2022-08-29 VITALS — BP 110/69 | HR 62 | Temp 97.9°F | Ht 67.01 in | Wt 155.6 lb

## 2022-08-29 DIAGNOSIS — E440 Moderate protein-calorie malnutrition: Secondary | ICD-10-CM | POA: Diagnosis not present

## 2022-08-29 DIAGNOSIS — D508 Other iron deficiency anemias: Secondary | ICD-10-CM

## 2022-08-29 DIAGNOSIS — E063 Autoimmune thyroiditis: Secondary | ICD-10-CM

## 2022-08-29 DIAGNOSIS — R5383 Other fatigue: Secondary | ICD-10-CM | POA: Diagnosis not present

## 2022-08-29 DIAGNOSIS — D510 Vitamin B12 deficiency anemia due to intrinsic factor deficiency: Secondary | ICD-10-CM | POA: Diagnosis not present

## 2022-08-29 DIAGNOSIS — F4310 Post-traumatic stress disorder, unspecified: Secondary | ICD-10-CM | POA: Diagnosis not present

## 2022-08-29 DIAGNOSIS — Z8619 Personal history of other infectious and parasitic diseases: Secondary | ICD-10-CM | POA: Diagnosis not present

## 2022-08-29 NOTE — Progress Notes (Signed)
BP 110/69   Pulse 62 Comment: apical  Temp 97.9 F (36.6 C) (Oral)   Ht 5' 7.01" (1.702 m)   Wt 155 lb 9.6 oz (70.6 kg)   SpO2 99%   BMI 24.36 kg/m    Subjective:    Patient ID: [name] Robinson, female    DOB: [birth year], 72 y.o.   MRN: [id number]  HPI: [name] Robinson is a 72 y.o. female  Chief Complaint  Patient presents with   Weight Check   Stiff    B/L calves   Fatigue   FATIGUE Presents today for fatigue -- wakes up, gets dressed/ready, and then wants to lay down.  History of anemia + has underlying hypothyroid.  Saw endo on 08/15/22, they increased Levothyroxine to 88 MCG.   Continues on Trazodone 100 MG at night and Gabapentin 300 MG morning & noon + 600 MG at night -- taking for neuropathy after septic shock one year ago. Duration:  chronic Severity: 6/10  Onset: sudden Context when symptoms started:  unknown Symptoms improve with rest: yes  Depressive symptoms:  occasional, continues with therapy Stress/anxiety: no Insomnia: no Snoring: no Observed apnea by bed partner: no Daytime hypersomnolence:no Wakes feeling refreshed: yes History of sleep study: yes -- does not use CPAP, has not since weight loss surgery Dysnea on exertion:  no Orthopnea/PND: no Chest pain: no Chronic cough: no Lower extremity edema: no Arthralgias:yes -- stiffness in the lower legs Myalgias: no Weakness: no Rash: no   Relevant past medical, surgical, family and social history reviewed and updated as indicated. Interim medical history since our last visit reviewed. Allergies and medications reviewed and updated.  Review of Systems  Constitutional:  Positive for fatigue. Negative for activity change, appetite change, diaphoresis and fever.  Respiratory:  Negative for cough, chest tightness, shortness of breath and wheezing.   Cardiovascular:  Negative for chest pain, palpitations and leg swelling.  Gastrointestinal: Negative.   Endocrine: Negative for cold intolerance and heat  intolerance.  Neurological: Negative.   Psychiatric/Behavioral: Negative.     Per HPI unless specifically indicated above     Objective:    BP 110/69   Pulse 62 Comment: apical  Temp 97.9 F (36.6 C) (Oral)   Ht 5' 7.01" (1.702 m)   Wt 155 lb 9.6 oz (70.6 kg)   SpO2 99%   BMI 24.36 kg/m   Wt Readings from Last 3 Encounters:  08/29/22 155 lb 9.6 oz (70.6 kg)  08/15/22 154 lb 8 oz (70.1 kg)  06/09/22 147 lb (66.7 kg)    Physical Exam Vitals and nursing note reviewed.  Constitutional:      General: She is awake. She is not in acute distress.    Appearance: She is well-developed and well-groomed. She is not ill-appearing or toxic-appearing.  HENT:     Head: Normocephalic.     Right Ear: Hearing normal.     Left Ear: Hearing normal.  Eyes:     General: Lids are normal.        Right eye: No discharge.        Left eye: No discharge.     Conjunctiva/sclera: Conjunctivae normal.     Pupils: Pupils are equal, round, and reactive to light.  Neck:     Thyroid: No thyromegaly.     Vascular: No carotid bruit.  Cardiovascular:     Rate and Rhythm: Regular rhythm. Bradycardia present.     Heart sounds: Normal heart sounds. No murmur heard.    No  gallop.  Pulmonary:     Effort: Pulmonary effort is normal. No accessory muscle usage or respiratory distress.     Breath sounds: Normal breath sounds.  Abdominal:     General: Bowel sounds are normal.     Palpations: Abdomen is soft.  Musculoskeletal:     Cervical back: Normal range of motion and neck supple.     Right lower leg: No edema.     Left lower leg: No edema.  Skin:    General: Skin is warm and dry.  Neurological:     Mental Status: She is alert and oriented to person, place, and time.  Psychiatric:        Attention and Perception: Attention normal.        Mood and Affect: Mood normal.        Speech: Speech normal.        Behavior: Behavior normal. Behavior is cooperative.        Thought Content: Thought content  normal.    Results for orders placed or performed in visit on 08/08/22  TSH  Result Value Ref Range   TSH 3.770 0.450 - 4.500 uIU/mL  T4, free  Result Value Ref Range   Free T4 1.28 0.82 - 1.77 ng/dL      Assessment & Plan:   Problem List Items Addressed This Visit       Endocrine   Hashimoto's thyroiditis    Chronic, stable.  Continue current medication regimen and adjust as needed.  She is gaining weight and overall improving.  Thyroid ultrasound completed and overall reassuring.  Continue collaboration with endo.        Other   Fatigue    Will check labs today and determine next steps after these return.  Cut back on Trazodone to one tablet at night and cut back on Gabapentin now that overall health improving.        Relevant Orders   Comprehensive metabolic panel   Magnesium   History of septic shock    Stabilized at this time and improving.  Have recommended continue supplements at home 3-4 times a day.  Continue to collaborate with psychiatry + therapy.       Iron deficiency anemia    Present since bariatric surgery and followed by hematology in past.  Continue supplements at home and collaboration with hematology.  Recheck labs today.      Relevant Orders   Iron Binding Cap (TIBC)([facility])   Ferritin   Moderate protein-calorie malnutrition ([place]) - Primary    Is continuing to gain and thyroid is stabilizing. Loss related to 4 month hospitalization and deconditioning in early 2023.  Recommend continue lactose free supplements 3-4 times daily, as Boost caused nausea.  Ensure eating frequent, small, healthy meals.  Labs today.      Pernicious anemia    Chronic, ongoing.  Continue current supplement and collaboration with hematology as needed.  Recheck labs due to new onset fatigue.      Relevant Orders   CBC with Differential/Platelet   Vitamin B12     Follow up plan: Return for as scheduled.

## 2022-08-29 NOTE — Assessment & Plan Note (Signed)
Is continuing to gain and thyroid is stabilizing. Loss related to 4 month hospitalization and deconditioning in early 2023.  Recommend continue lactose free supplements 3-4 times daily, as Boost caused nausea.  Ensure eating frequent, small, healthy meals.  Labs today.

## 2022-08-29 NOTE — Assessment & Plan Note (Signed)
Will check labs today and determine next steps after these return.  Cut back on Trazodone to one tablet at night and cut back on Gabapentin now that overall health improving.

## 2022-08-29 NOTE — Assessment & Plan Note (Signed)
Stabilized at this time and improving.  Have recommended continue supplements at home 3-4 times a day.  Continue to collaborate with psychiatry + therapy.

## 2022-08-29 NOTE — Assessment & Plan Note (Signed)
Present since bariatric surgery and followed by hematology in past.  Continue supplements at home and collaboration with hematology.  Recheck labs today.

## 2022-08-29 NOTE — Assessment & Plan Note (Signed)
Chronic, ongoing.  Continue current supplement and collaboration with hematology as needed.  Recheck labs due to new onset fatigue.

## 2022-08-29 NOTE — Assessment & Plan Note (Signed)
Chronic, stable.  Continue current medication regimen and adjust as needed.  She is gaining weight and overall improving.  Thyroid ultrasound completed and overall reassuring.  Continue collaboration with endo.

## 2022-08-30 LAB — COMPREHENSIVE METABOLIC PANEL
ALT: 62 IU/L — ABNORMAL HIGH (ref 0–32)
AST: 40 IU/L (ref 0–40)
Albumin/Globulin Ratio: 2 (ref 1.2–2.2)
Albumin: 4.2 g/dL (ref 3.9–4.9)
Alkaline Phosphatase: 103 IU/L (ref 44–121)
BUN/Creatinine Ratio: 27 (ref 12–28)
BUN: 18 mg/dL (ref 8–27)
Bilirubin Total: 0.3 mg/dL (ref 0.0–1.2)
CO2: 25 mmol/L (ref 20–29)
Calcium: 9.4 mg/dL (ref 8.7–10.3)
Chloride: 106 mmol/L (ref 96–106)
Creatinine, Ser: 0.67 mg/dL (ref 0.57–1.00)
Globulin, Total: 2.1 g/dL (ref 1.5–4.5)
Glucose: 66 mg/dL — ABNORMAL LOW (ref 70–99)
Potassium: 4.4 mmol/L (ref 3.5–5.2)
Sodium: 144 mmol/L (ref 134–144)
Total Protein: 6.3 g/dL (ref 6.0–8.5)
eGFR: 95 mL/min/{1.73_m2} (ref >59)

## 2022-08-30 LAB — MAGNESIUM: Magnesium: 2.4 mg/dL — ABNORMAL HIGH (ref 1.6–2.3)

## 2022-08-30 LAB — CBC WITH DIFFERENTIAL/PLATELET
Basophils Absolute: 0 10*3/uL (ref 0.0–0.2)
Basos: 1 %
EOS (ABSOLUTE): 0 10*3/uL (ref 0.0–0.4)
Eos: 1 %
Hematocrit: 34.3 % (ref 34.0–46.6)
Hemoglobin: 11.5 g/dL (ref 11.1–15.9)
Immature Grans (Abs): 0 10*3/uL (ref 0.0–0.1)
Immature Granulocytes: 0 %
Lymphocytes Absolute: 1.3 10*3/uL (ref 0.7–3.1)
Lymphs: 31 %
MCH: 30.9 pg (ref 26.6–33.0)
MCHC: 33.5 g/dL (ref 31.5–35.7)
MCV: 92 fL (ref 79–97)
Monocytes Absolute: 0.2 10*3/uL (ref 0.1–0.9)
Monocytes: 5 %
Neutrophils Absolute: 2.7 10*3/uL (ref 1.4–7.0)
Neutrophils: 62 %
Platelets: 165 10*3/uL (ref 150–450)
RBC: 3.72 x10E6/uL — ABNORMAL LOW (ref 3.77–5.28)
RDW: 11.4 % — ABNORMAL LOW (ref 11.7–15.4)
WBC: 4.3 10*3/uL (ref 3.4–10.8)

## 2022-08-30 LAB — IRON AND TIBC
Iron Saturation: 31 % (ref 15–55)
Iron: 78 ug/dL (ref 27–139)
Total Iron Binding Capacity: 255 ug/dL (ref 250–450)
UIBC: 177 ug/dL (ref 118–369)

## 2022-08-30 LAB — VITAMIN B12: Vitamin B-12: >2000 pg/mL — ABNORMAL HIGH (ref 232–1245)

## 2022-08-30 LAB — FERRITIN: Ferritin: 765 ng/mL — ABNORMAL HIGH (ref 15–150)

## 2022-08-30 NOTE — Progress Notes (Signed)
Contacted via [place] afternoon [name] Robinson, your labs have returned: - CBC shows stable hemoglobin and hematocrit + iron level normal.  Goods news!! - Sugar level is a little low at 66 (glucose), this could cause some fatigue.  Make sure you are taking in small, healthy snacks frequently throughout day to ensure sugar levels stay stable. - Liver function shows mild elevation in ALT and normal AST. If any recent alcohol use or increase Tylenol use, try to cut back on this and we will recheck next visit. - Ferritin level remains a  high, but trending down still -- if remains high may be good to return to hematology for assessment. - Magnesium a little high, if taking supplement cut back to every other day on this. Same with B12, cut back to every other day dosing.  Any questions? Keep being wonderful!!  Thank you for allowing me to participate in your care.  I appreciate you. Kindest regards, Sabreen Kitchen

## 2022-09-01 ENCOUNTER — Encounter: Payer: Self-pay | Admitting: Nurse Practitioner

## 2022-09-20 ENCOUNTER — Encounter: Payer: Self-pay | Admitting: Nurse Practitioner

## 2022-09-20 DIAGNOSIS — Z1283 Encounter for screening for malignant neoplasm of skin: Secondary | ICD-10-CM

## 2022-09-21 MED ORDER — BUSPIRONE HCL 15 MG PO TABS: 15.0000 mg | ORAL_TABLET | Freq: Two times a day (BID) | ORAL | 4 refills | Status: DC

## 2022-09-22 NOTE — Addendum Note (Signed)
Addended by: Donzetta Kohut A on: 09/22/2022 07:53 AM   Modules accepted: Orders

## 2022-09-22 NOTE — Addendum Note (Signed)
Addended by: Marnee Guarneri T on: 09/22/2022 08:08 AM   Modules accepted: Orders

## 2022-10-03 DIAGNOSIS — F4310 Post-traumatic stress disorder, unspecified: Secondary | ICD-10-CM | POA: Diagnosis not present

## 2022-10-19 ENCOUNTER — Encounter: Payer: Self-pay | Admitting: Nurse Practitioner

## 2022-10-20 ENCOUNTER — Other Ambulatory Visit: Payer: Self-pay | Admitting: Nurse Practitioner

## 2022-10-20 DIAGNOSIS — E063 Autoimmune thyroiditis: Secondary | ICD-10-CM

## 2022-10-20 NOTE — Telephone Encounter (Signed)
Pt was called and scheduled 11/08/2022 @ 10:20 for labs only

## 2022-10-31 DIAGNOSIS — F4310 Post-traumatic stress disorder, unspecified: Secondary | ICD-10-CM | POA: Diagnosis not present

## 2022-11-08 ENCOUNTER — Other Ambulatory Visit: Payer: Medicare HMO

## 2022-11-08 DIAGNOSIS — E063 Autoimmune thyroiditis: Secondary | ICD-10-CM

## 2022-11-09 LAB — T4, FREE: Free T4: 1.3 ng/dL (ref 0.82–1.77)

## 2022-11-09 LAB — TSH: TSH: 1.79 u[IU]/mL (ref 0.450–4.500)

## 2022-11-09 NOTE — Progress Notes (Signed)
Wonderful.  Thanks for the update. 

## 2022-11-09 NOTE — Progress Notes (Signed)
Contacted via MyChart   Good morning, thyroid labs are normal I will forward to [place].:)

## 2022-11-14 ENCOUNTER — Encounter: Payer: Self-pay | Admitting: Nurse Practitioner

## 2022-11-14 ENCOUNTER — Ambulatory Visit: Payer: Medicare HMO | Admitting: Nurse Practitioner

## 2022-11-14 VITALS — BP 138/82 | HR 56 | Ht 67.0 in | Wt 164.8 lb

## 2022-11-14 DIAGNOSIS — E063 Autoimmune thyroiditis: Secondary | ICD-10-CM | POA: Diagnosis not present

## 2022-11-14 DIAGNOSIS — E038 Other specified hypothyroidism: Secondary | ICD-10-CM | POA: Diagnosis not present

## 2022-11-14 MED ORDER — LEVOTHYROXINE SODIUM 88 MCG PO TABS: 88.0000 ug | ORAL_TABLET | Freq: Every day | ORAL | 1 refills | Status: DC

## 2022-11-14 NOTE — Progress Notes (Signed)
Endocrinology Follow Up Note                                         11/14/2022, 2:57 PM  Subjective:   Subjective    [name] Robinson is a 72 y.o.-year-old female patient being seen in follow up after being seen in consultation for hypothyroidism referred by [name] Lick, NP.   Past Medical History:  Diagnosis Date   Hypothyroidism    Morbid obesity 11/2019   s/p Barriatric Sgx ([facility])   Sleep apnea    doesn't use CPAP, lost weight, not needed    Past Surgical History:  Procedure Laterality Date   ABDOMINAL HYSTERECTOMY     BARIATRIC SURGERY N/A 01/02/2020   duodenal switch   CERVICAL DISC SURGERY N/A    discectomy   COLONOSCOPY     ESOPHAGOGASTRODUODENOSCOPY  11/06/2020   EYE SURGERY Bilateral    laser procedure   LAPAROSCOPIC COLON RESECTION     MASS EXCISION Left 04/13/2021   Procedure: EXCISION MASS, left lateral thigh;  Surgeon: Ronny Bacon, MD;  Location: [facility] ORS;  Service: General;  Laterality: Left;   SHOULDER SURGERY Left    torn cartilege   TONSILLECTOMY N/A    TOTAL HIP ARTHROPLASTY Right 11/02/2020   TRANSTHORACIC ECHOCARDIOGRAM  07/14/2019   a) [facility]) Technically difficult.-Used Definity.  Nl LV Fxn -EF 55 - 60%.  No obvious WMA.  Mild MR.  No effusion;; b) Echo 04/16/2021-[facility]: (Fair quality) normal LV size and function.  EF 55-60%.  No R WMA.  Normal diastolic parameters.  Mild MR.  Unable to assess RVSP.    Social History   Socioeconomic History   Marital status: Married    Spouse name: [name] Robinson   Number of children: 2   Years of education: Not on file   Highest education level: Some college, no degree  Occupational History   Not on file  Tobacco Use   Smoking status: Never   Smokeless tobacco: Never  Vaping Use   Vaping Use: Never used  Substance and Sexual Activity   Alcohol use: Never   Drug use: Never   Sexual activity: Not Currently  Other Topics  Concern   Not on file  Social History Narrative   04/20/22 She is retired, lives with husband.    Otherwise, she is usually on the go, not sedentary.   Social Determinants of Health   Financial Resource Strain: Low Risk  (05/03/2022)   Overall Financial Resource Strain (CARDIA)    Difficulty of Paying Living Expenses: Not hard at all  Food Insecurity: No Food Insecurity (05/03/2022)   Hunger Vital Sign    Worried About Running Out of Food in the Last Year: Never true    Ran Out of Food in the Last Year: Never true  Transportation Needs: No Transportation Needs (05/03/2022)   PRAPARE - Hydrologist (Medical): No    Lack of Transportation (Non-Medical): No  Physical Activity: Insufficiently Active (05/03/2022)   Exercise  Vital Sign    Days of Exercise per Week: 4 days    Minutes of Exercise per Session: 30 min  Stress: No Stress Concern Present (05/03/2022)   [place]    Feeling of Stress : Not at all  Social Connections: [place] (05/03/2022)   Social Connection and Isolation Panel [NHANES]    Frequency of Communication with Friends and Family: More than three times a week    Frequency of Social Gatherings with Friends and Family: Twice a week    Attends Religious Services: More than 4 times per year    Active Member of [employer] or Organizations: Yes    Attends Music therapist: More than 4 times per year    Marital Status: Married    Family History  Problem Relation Age of Onset   Thyroid disease Mother    Cancer - Lung Father    Cancer - Lung Brother    Cancer Brother     Outpatient Encounter Medications as of 11/14/2022  Medication Sig   busPIRone (BUSPAR) 15 MG tablet Take 1 tablet (15 mg total) by mouth 2 (two) times daily.   citalopram (CELEXA) 20 MG tablet Take 20 mg by mouth daily.   ergocalciferol (VITAMIN D2) 1.25 MG (50000 UT) capsule Take 1 capsule  (50,000 Units total) by mouth once a week.   gabapentin (NEURONTIN) 300 MG capsule Take one tablet (300 MG) by mouth in the morning and at noon, then 900 MG (three tablets) by mouth at night. (Patient taking differently: Take one tablet (300 MG) by mouth in the morning and at noon, then 600 MG (two tablets) by mouth at night.)   Multiple Vitamin (MULTIVITAMIN) capsule Take 1 capsule by mouth daily.   traZODone (DESYREL) 50 MG tablet Take 2 tablets (100 mg total) by mouth at bedtime as needed.   vitamin B-12 (CYANOCOBALAMIN) 1000 MCG tablet Take 1,000 mcg by mouth daily.   [DISCONTINUED] levothyroxine (SYNTHROID) 88 MCG tablet Take 1 tablet (88 mcg total) by mouth daily before breakfast.   levothyroxine (SYNTHROID) 88 MCG tablet Take 1 tablet (88 mcg total) by mouth daily before breakfast.   No facility-administered encounter medications on file as of 11/14/2022.    ALLERGIES: Allergies  Allergen Reactions   Solifenacin Rash    Patient had bad rash with this med.   VACCINATION STATUS: Immunization History  Administered Date(s) Administered   Fluad Quad(high Dose 65+) 05/20/2021, 05/29/2022   Influenza Inj Mdck Quad Pf 05/29/2017   Influenza, High Dose Seasonal PF 05/17/2018, 05/07/2019   Influenza,inj,Quad PF,6+ Mos 06/18/2015, 05/19/2016, 05/07/2019   Influenza-Unspecified 05/29/2017, 06/16/2020   PFIZER(Purple Top)SARS-COV-2 Vaccination 10/11/2019, 11/05/2019   PPD Test 01/04/2021   Pneumococcal Conjugate-13 05/07/2019   Td 02/28/2021   Tdap 06/21/2003     HPI   [name] Robinson  is a patient with the above medical history. she was diagnosed with hypothyroidism years ago, which required subsequent initiation of thyroid hormone replacement. she was given various doses of Levothyroxine over the years, currently on 25 micrograms. she reports compliance to this medication:  Taking it daily on empty stomach  with water, separated by >30 minutes before breakfast and other medications , and  by at least 4 hours from calcium, iron, PPIs, multivitamins.  She notes she was hospitalized for sepsis last year and has since developed a constant tremor in which she has been trying to solve.  She weaned herself off Fentanyl and that did not  help her tremors.  I reviewed patient's thyroid tests:  Lab Results  Component Value Date   TSH 1.790 11/08/2022   TSH 3.770 08/08/2022   TSH 14.500 (H) 06/23/2022   TSH 12.500 (H) 04/07/2022   TSH 3.590 02/15/2022   TSH 0.056 (L) 12/14/2021   TSH 0.237 (L) 11/28/2021   TSH 4.220 11/09/2021   TSH 1.250 02/28/2021   FREET4 1.30 11/08/2022   FREET4 1.28 08/08/2022   FREET4 0.94 06/23/2022   FREET4 0.62 (L) 04/07/2022   FREET4 1.00 02/15/2022   FREET4 1.84 (H) 12/14/2021   FREET4 1.24 (H) 11/28/2021   FREET4 1.08 02/28/2021    Pt denies feeling nodules in neck, hoarseness, dysphagia/odynophagia, SOB with lying down.  she denies family history of thyroid disorders.  No family history of thyroid cancer.  No history of radiation therapy to head or neck.  No recent use of iodine supplements.  Denies use of Biotin containing supplements.  I reviewed her chart and she also has a history of neuropathy, vitamin d deficiency, cardiomyopathy, pernicious anemia, bariatric surgery.   ROS:  Constitutional: + weight gain (regained some previously lost unintentionally), + fatigue-improved, no subjective hyperthermia Eyes: no blurry vision, no xerophthalmia ENT: no sore throat, no nodules palpated in throat, no dysphagia/odynophagia, no hoarseness Cardiovascular: no chest pain, no SOB, no palpitations, no leg swelling Respiratory: no cough, no SOB Gastrointestinal: no nausea/vomiting/diarrhea Musculoskeletal: no muscle/joint aches Skin: no rashes Neurological: + full body tremors-improving under neurology care, no numbness, no tingling, no dizziness Psychiatric: no depression, no anxiety   Objective:   Objective     BP 138/82 (BP Location:  Right Arm, Patient Position: Sitting, Cuff Size: Large) Comment: Recheck - manuel cuff  Pulse (!) 56 Comment: Retake Radial  Ht 5\' 7"  (1.702 m)   Wt 164 lb 12.8 oz (74.8 kg)   BMI 25.81 kg/m  Wt Readings from Last 3 Encounters:  11/14/22 164 lb 12.8 oz (74.8 kg)  08/29/22 155 lb 9.6 oz (70.6 kg)  08/15/22 154 lb 8 oz (70.1 kg)    BP Readings from Last 3 Encounters:  11/14/22 138/82  08/29/22 110/69  08/15/22 138/82      Physical Exam- Limited  Constitutional:  Body mass index is 25.81 kg/m. , not in acute distress, normal state of mind Eyes:  EOMI, no exophthalmos Musculoskeletal: no gross deformities, strength intact in all four extremities, no gross restriction of joint movements Skin:  no rashes, no hyperemia Neurological: no tremor with outstretched hands   CMP ( most recent) CMP     Component Value Date/Time   NA 144 08/29/2022 1410   K 4.4 08/29/2022 1410   CL 106 08/29/2022 1410   CO2 25 08/29/2022 1410   GLUCOSE 66 (L) 08/29/2022 1410   GLUCOSE 156 (H) 06/10/2021 0615   BUN 18 08/29/2022 1410   CREATININE 0.67 08/29/2022 1410   CALCIUM 9.4 08/29/2022 1410   PROT 6.3 08/29/2022 1410   ALBUMIN 4.2 08/29/2022 1410   AST 40 08/29/2022 1410   ALT 62 (H) 08/29/2022 1410   ALKPHOS 103 08/29/2022 1410   BILITOT 0.3 08/29/2022 1410   GFRNONAA >60 06/10/2021 0615     Diabetic Labs (most recent): No results found for: "HGBA1C", "MICROALBUR"   Lipid Panel ( most recent) Lipid Panel     Component Value Date/Time   CHOL 177 05/29/2022 1352   TRIG 134 05/29/2022 1352   HDL 80 05/29/2022 1352   LDLCALC 74 05/29/2022 1352   LABVLDL 23 05/29/2022  1352       Lab Results  Component Value Date   TSH 1.790 11/08/2022   TSH 3.770 08/08/2022   TSH 14.500 (H) 06/23/2022   TSH 12.500 (H) 04/07/2022   TSH 3.590 02/15/2022   TSH 0.056 (L) 12/14/2021   TSH 0.237 (L) 11/28/2021   TSH 4.220 11/09/2021   TSH 1.250 02/28/2021   FREET4 1.30 11/08/2022   FREET4  1.28 08/08/2022   FREET4 0.94 06/23/2022   FREET4 0.62 (L) 04/07/2022   FREET4 1.00 02/15/2022   FREET4 1.84 (H) 12/14/2021   FREET4 1.24 (H) 11/28/2021   FREET4 1.08 02/28/2021     Thyroid US from 02/23/22 CLINICAL DATA:  Other.  Hashimoto's thyroiditis   EXAM: THYROID ULTRASOUND   TECHNIQUE: Ultrasound examination of the thyroid gland and adjacent soft tissues was performed.   COMPARISON:  None Available.   FINDINGS: Parenchymal Echotexture: Markedly heterogenous   Isthmus: 0.2 cm   Right lobe: 5.4 x 3.1 x 2.6 cm   Left lobe: 5.8 x 3.2 x 2.4 cm   _________________________________________________________   Estimated total number of nodules >/= 1 cm: 0   Number of spongiform nodules >/=  2 cm not described below (TR1): 0   Number of mixed cystic and solid nodules >/= 1.5 cm not described below (TR2): 0   _________________________________________________________   No discrete nodules are seen within the thyroid gland.   IMPRESSION: 1. Diffusely enlarged and heterogeneous thyroid gland. The imaging appearance is consistent with the clinical history of Hashimoto's thyroiditis. 2. No discrete thyroid nodules are visualized.     Electronically Signed   By: Jacqulynn Cadet M.D.   On: 02/24/2022 16:12   Latest Reference Range & Units 06/23/22 09:29 08/08/22 09:06 11/08/22 10:20  TSH 0.450 - 4.500 uIU/mL 14.500 (H) 3.770 1.790  T4,Free(Direct) 0.82 - 1.77 ng/dL 0.94 1.28 1.30  (H): Data is abnormally high  Assessment & Plan:   ASSESSMENT / PLAN:  1. Hypothyroidism-r/t Hashimotos thyroiditis.   Patient with long-standing hypothyroidism, on levothyroxine therapy. On physical exam , patient  does not  have  gross goiter, thyroid nodules, or neck compression symptoms.  Positive antibody testing confirms suspicion of autoimmune thyroid disease.  Her previsit thyroid function tests are consistent with appropriate hormone replacement.  She is advised to continue  her dose of Levothyroxine 88 mcg po daily before breakfast.  - We discussed about correct intake of levothyroxine, at fasting, with water, separated by at least 30 minutes from breakfast, and separated by more than 4 hours from calcium, iron, multivitamins, acid reflux medications (PPIs). -Patient is made aware of the fact that thyroid hormone replacement is needed for life, dose to be adjusted by periodic monitoring of thyroid function tests.  -She recently had thyroid US on 7/13 which showed enlarged thyroid with heterogeneous tissue and no nodularity.    I spent  24  minutes in the care of the patient today including review of labs from Thyroid Function, CMP, and other relevant labs ; imaging/biopsy records (current and previous including abstractions from other facilities); face-to-face time discussing  her lab results and symptoms, medications doses, her options of short and long term treatment based on the latest standards of care / guidelines;   and documenting the encounter.  [name]  participated in the discussions, expressed understanding, and voiced agreement with the above plans.  All questions were answered to her satisfaction. she is encouraged to contact clinic should she have any questions or concerns prior to her return  visit.   FOLLOW UP PLAN:  Return in about 4 months (around 03/16/2023) for Thyroid follow up, Previsit labs.  Rayetta Pigg, [facility] [address] [place], [place] [address] Phone: [phone] Fax: [phone]  11/14/2022, 2:57 PM

## 2022-11-14 NOTE — Patient Instructions (Signed)

## 2022-11-22 ENCOUNTER — Telehealth: Payer: Self-pay | Admitting: Nurse Practitioner

## 2022-11-22 NOTE — Telephone Encounter (Signed)
Reached out to patient she stated that she didn't think that the law firm had sent anything over for a Release of Information however she had filled out the proper paperwork.  Patient verbalized understanding.

## 2022-11-22 NOTE — Telephone Encounter (Signed)
Copied from CRM [id number]. Topic: General - Other >> Nov 22, 2022 11:39 AM Patsy Lager T wrote: Reason for CRM: Patient stated she has an active lawsuit and needs to know if attorney from [employer] has requested her medical records

## 2022-12-05 DIAGNOSIS — F4310 Post-traumatic stress disorder, unspecified: Secondary | ICD-10-CM | POA: Diagnosis not present

## 2022-12-11 DIAGNOSIS — F4310 Post-traumatic stress disorder, unspecified: Secondary | ICD-10-CM | POA: Diagnosis not present

## 2022-12-19 IMAGING — MG MM DIGITAL SCREENING BILAT W/ TOMO AND CAD
8 series · 9 of 24 positions shown · non-contrast
Comparison: Previous exam(s).

CLINICAL DATA: Screening.

EXAM:
DIGITAL SCREENING BILATERAL MAMMOGRAM WITH TOMOSYNTHESIS AND CAD
TECHNIQUE: Bilateral screening digital craniocaudal and mediolateral oblique
mammograms were obtained. Bilateral screening digital breast
tomosynthesis was performed. The images were evaluated with
computer-aided detection.

[R CC synth-2D]
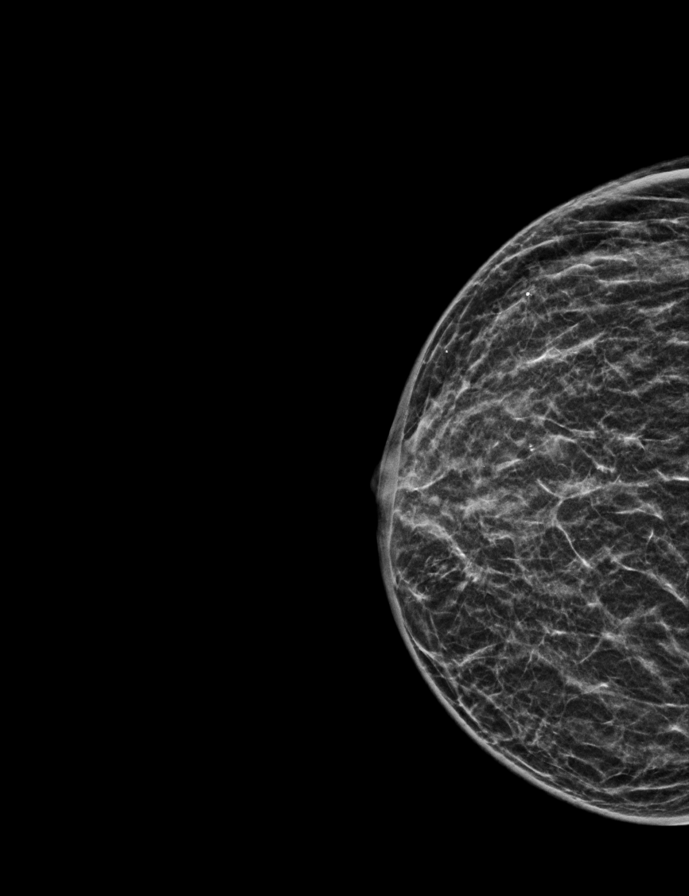

[L CC synth-2D]
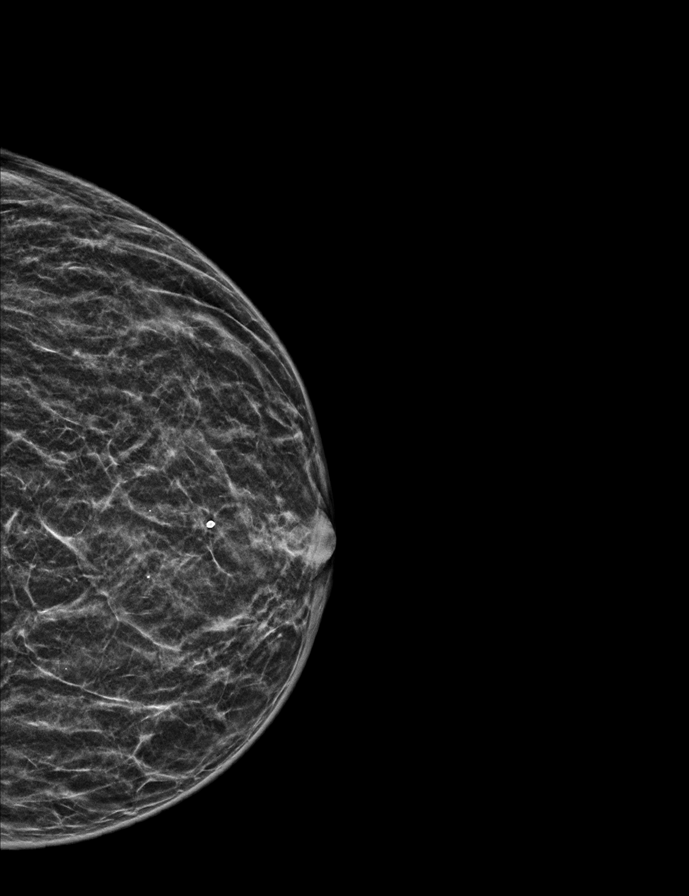

[R MLO synth-2D]
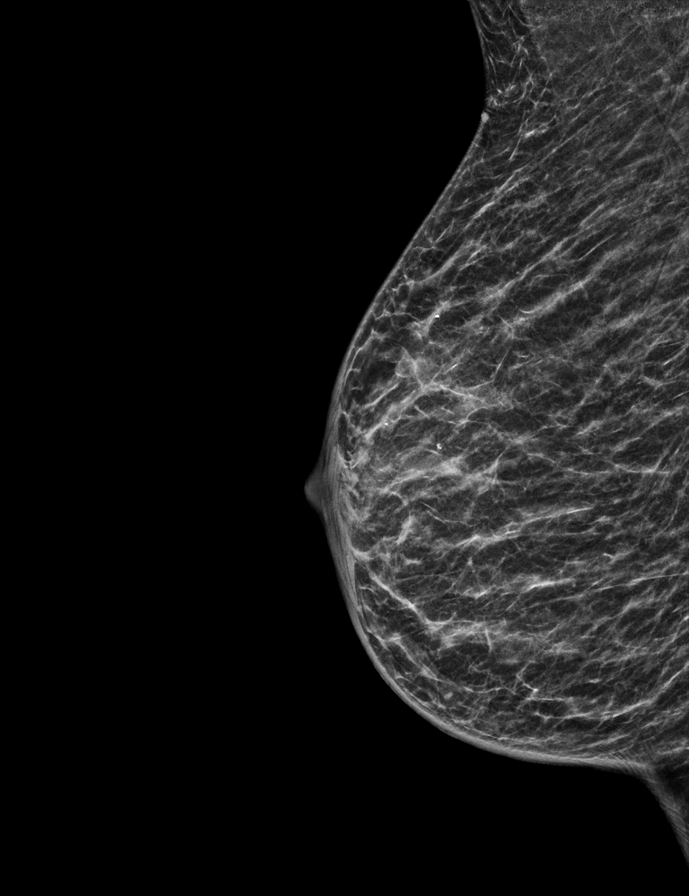

[L MLO synth-2D]
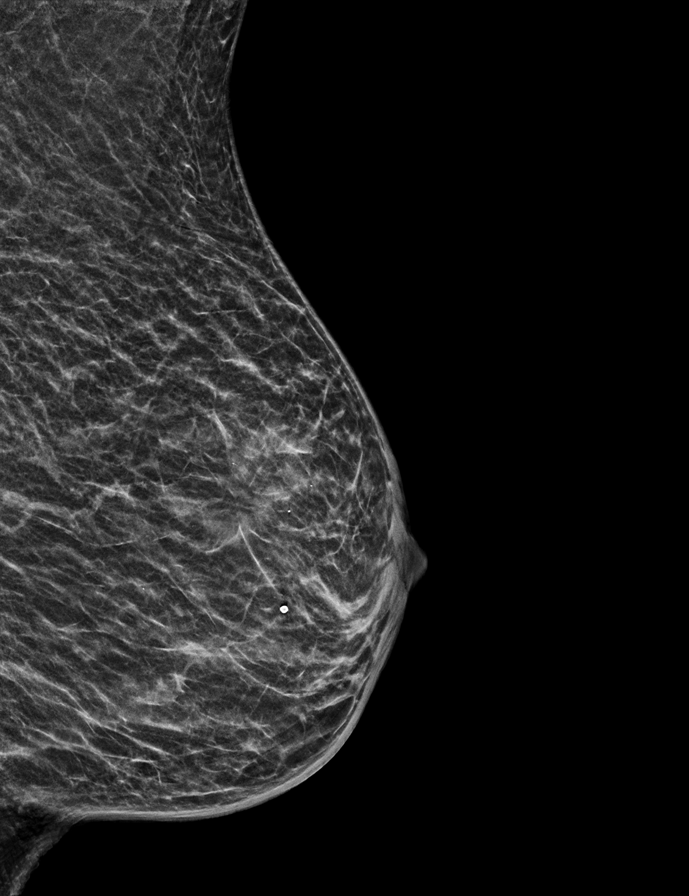

[L CC tomo · 2 of 26 frames shown]
[frame 9/26]
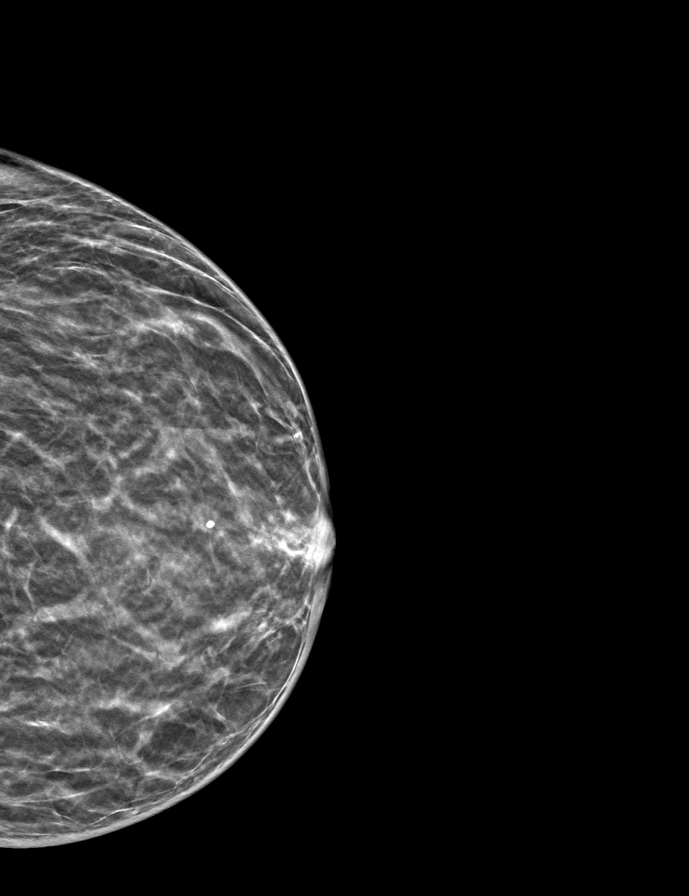
[frame 13/26]
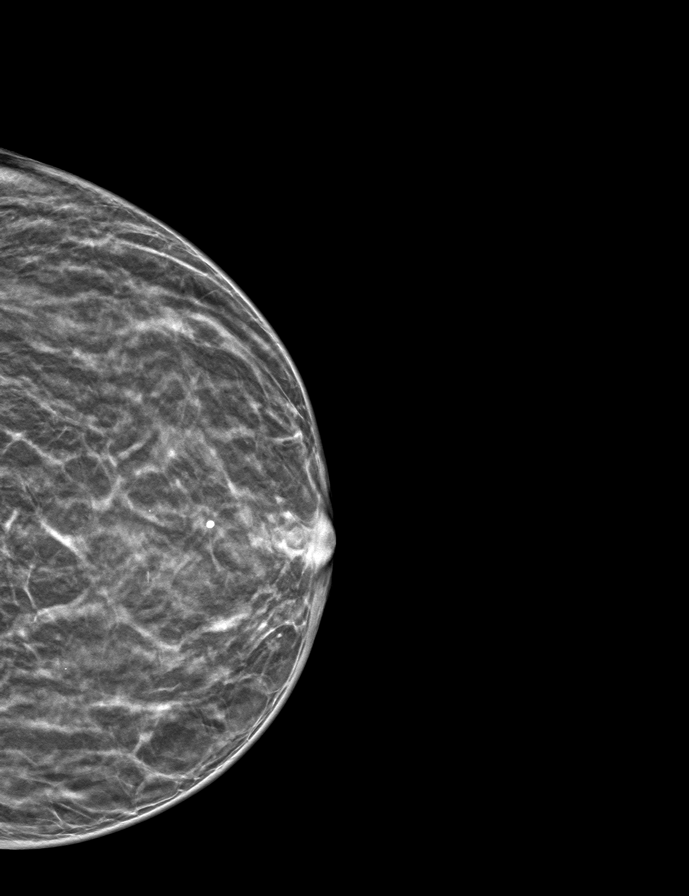

[R CC tomo · tomo slice 15/28.0]
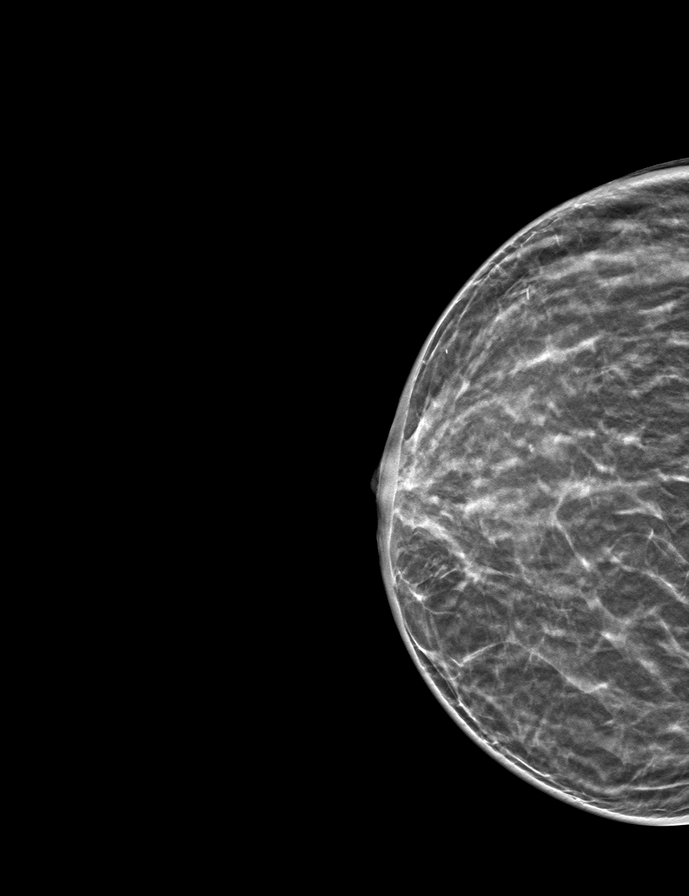

[R MLO tomo · tomo slice 18/35.0]
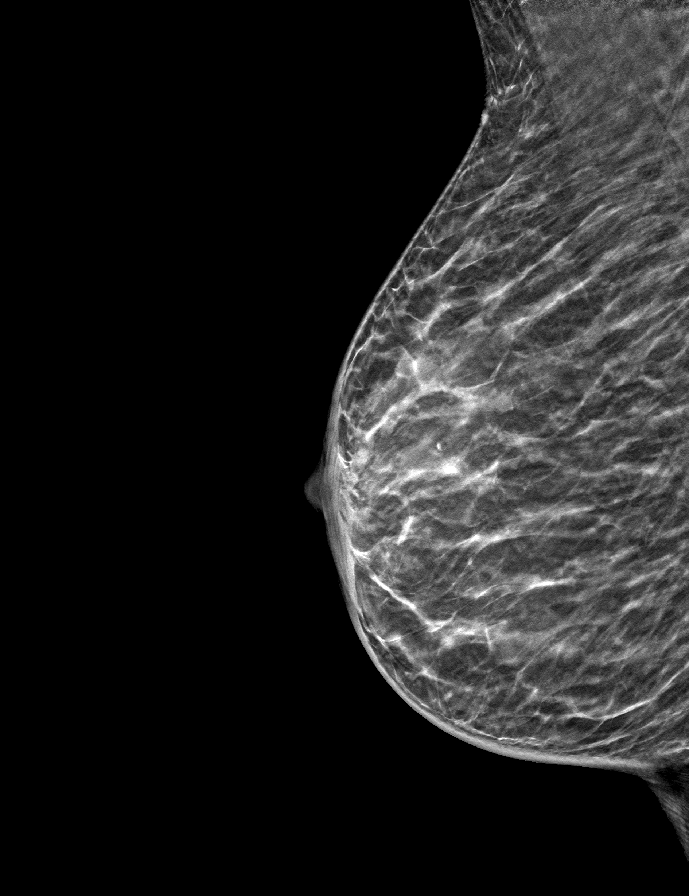

[L MLO tomo · tomo slice 15/30.0]
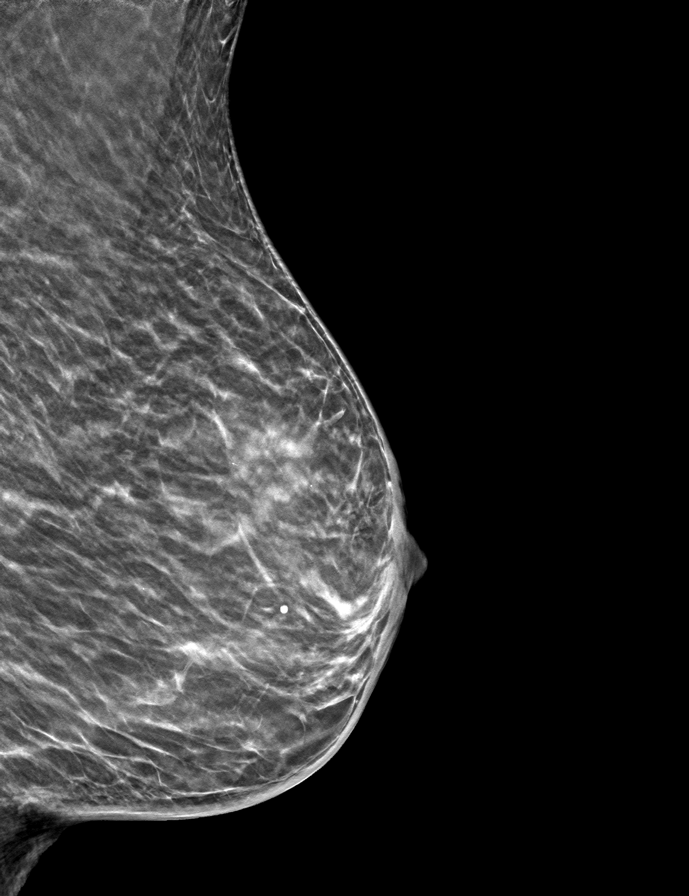

[9 of 24 positions shown; findings below may reference images not displayed]

ACR Breast Density Category b: There are scattered areas of
fibroglandular density.
FINDINGS: In the left breast, possible distortion warrants further evaluation.
This possible distortion is seen within the upper LEFT breast, MLO
slice 10.

In the right breast, no findings suspicious for malignancy.
IMPRESSION: Further evaluation is suggested for possible distortion in the left
breast.

RECOMMENDATION:
Diagnostic mammogram and possibly ultrasound of the left breast.
(Code:[id number])

The patient will be contacted regarding the findings, and additional
imaging will be scheduled.

BI-RADS CATEGORY  0: Incomplete. Need additional imaging evaluation
and/or prior mammograms for comparison.

## 2023-01-04 DIAGNOSIS — F4310 Post-traumatic stress disorder, unspecified: Secondary | ICD-10-CM | POA: Diagnosis not present

## 2023-01-05 ENCOUNTER — Telehealth: Payer: Self-pay | Admitting: Nurse Practitioner

## 2023-01-05 NOTE — Telephone Encounter (Unsigned)
Copied from CRM #[id number]. Topic: Medical Record Request - Other >> Jan 05, 2023  1:43 PM Victoria B wrote: Reason for CRM: pt called in wants to know if any requests from her lawyer were received for her records. She was told the request was done in February 

## 2023-01-05 NOTE — Telephone Encounter (Signed)
Called to inform patient that a medical request has not been received or processed from our office.

## 2023-01-05 NOTE — Telephone Encounter (Unsigned)
Copied from CRM [id number]. Topic: Medical Record Request - Other >> Jan 05, 2023  1:43 PM [place] B wrote: Reason for CRM: pt called in wants to know if any requests from her lawyer were received for her records. She was told the request was done in February

## 2023-01-14 ENCOUNTER — Encounter: Payer: Self-pay | Admitting: Nurse Practitioner

## 2023-01-14 DIAGNOSIS — E038 Other specified hypothyroidism: Secondary | ICD-10-CM

## 2023-01-14 DIAGNOSIS — D508 Other iron deficiency anemias: Secondary | ICD-10-CM

## 2023-01-16 ENCOUNTER — Other Ambulatory Visit: Payer: Medicare HMO

## 2023-01-16 DIAGNOSIS — D508 Other iron deficiency anemias: Secondary | ICD-10-CM

## 2023-01-16 DIAGNOSIS — E063 Autoimmune thyroiditis: Secondary | ICD-10-CM

## 2023-01-16 DIAGNOSIS — E038 Other specified hypothyroidism: Secondary | ICD-10-CM | POA: Diagnosis not present

## 2023-01-17 ENCOUNTER — Encounter: Payer: Self-pay | Admitting: Nurse Practitioner

## 2023-01-17 LAB — COMPREHENSIVE METABOLIC PANEL
ALT: 17 IU/L (ref 0–32)
AST: 20 IU/L (ref 0–40)
Albumin/Globulin Ratio: 1.8 (ref 1.2–2.2)
Albumin: 4.2 g/dL (ref 3.9–4.9)
Alkaline Phosphatase: 97 IU/L (ref 44–121)
BUN/Creatinine Ratio: 29 — ABNORMAL HIGH (ref 12–28)
BUN: 19 mg/dL (ref 8–27)
Bilirubin Total: 0.3 mg/dL (ref 0.0–1.2)
CO2: 26 mmol/L (ref 20–29)
Calcium: 9.2 mg/dL (ref 8.7–10.3)
Chloride: 105 mmol/L (ref 96–106)
Creatinine, Ser: 0.65 mg/dL (ref 0.57–1.00)
Globulin, Total: 2.3 g/dL (ref 1.5–4.5)
Glucose: 79 mg/dL (ref 70–99)
Potassium: 4.3 mmol/L (ref 3.5–5.2)
Sodium: 142 mmol/L (ref 134–144)
Total Protein: 6.5 g/dL (ref 6.0–8.5)
eGFR: 95 mL/min/{1.73_m2} (ref >59)

## 2023-01-17 LAB — CBC WITH DIFFERENTIAL/PLATELET
Basophils Absolute: 0 10*3/uL (ref 0.0–0.2)
Basos: 1 %
EOS (ABSOLUTE): 0 10*3/uL (ref 0.0–0.4)
Eos: 0 %
Hematocrit: 35.4 % (ref 34.0–46.6)
Hemoglobin: 11.7 g/dL (ref 11.1–15.9)
Immature Grans (Abs): 0 10*3/uL (ref 0.0–0.1)
Immature Granulocytes: 0 %
Lymphocytes Absolute: 1.3 10*3/uL (ref 0.7–3.1)
Lymphs: 28 %
MCH: 30.5 pg (ref 26.6–33.0)
MCHC: 33.1 g/dL (ref 31.5–35.7)
MCV: 92 fL (ref 79–97)
Monocytes Absolute: 0.2 10*3/uL (ref 0.1–0.9)
Monocytes: 5 %
Neutrophils Absolute: 3 10*3/uL (ref 1.4–7.0)
Neutrophils: 66 %
Platelets: 166 10*3/uL (ref 150–450)
RBC: 3.83 x10E6/uL (ref 3.77–5.28)
RDW: 11.9 % (ref 11.7–15.4)
WBC: 4.6 10*3/uL (ref 3.4–10.8)

## 2023-01-17 LAB — IRON: Iron: 83 ug/dL (ref 27–139)

## 2023-01-17 LAB — T4, FREE: Free T4: 1.23 ng/dL (ref 0.82–1.77)

## 2023-01-17 LAB — FERRITIN: Ferritin: 857 ng/mL — ABNORMAL HIGH (ref 15–150)

## 2023-01-17 LAB — TSH: TSH: 0.823 u[IU]/mL (ref 0.450–4.500)

## 2023-01-17 NOTE — Progress Notes (Signed)
Contacted via MyChart   Good morning French Ana, overall your labs look good with exception of ferritin level which is a bit elevated.  Are you taking supplements at home?  You may benefit a return to hematology to assess, you should be able to call and schedule since it has been less then 3 years since you saw them:)

## 2023-01-22 ENCOUNTER — Encounter: Payer: Self-pay | Admitting: Oncology

## 2023-01-22 ENCOUNTER — Inpatient Hospital Stay: Payer: Medicare HMO

## 2023-01-22 ENCOUNTER — Inpatient Hospital Stay: Payer: Medicare HMO | Attending: Oncology | Admitting: Oncology

## 2023-01-22 VITALS — BP 120/69 | HR 56 | Temp 97.9°F | Resp 18 | Wt 169.8 lb

## 2023-01-22 DIAGNOSIS — Z801 Family history of malignant neoplasm of trachea, bronchus and lung: Secondary | ICD-10-CM | POA: Insufficient documentation

## 2023-01-22 DIAGNOSIS — D649 Anemia, unspecified: Secondary | ICD-10-CM

## 2023-01-22 DIAGNOSIS — R5383 Other fatigue: Secondary | ICD-10-CM | POA: Diagnosis not present

## 2023-01-22 DIAGNOSIS — R7989 Other specified abnormal findings of blood chemistry: Secondary | ICD-10-CM | POA: Diagnosis not present

## 2023-01-22 LAB — CBC (CANCER CENTER ONLY)
HCT: 35.3 % — ABNORMAL LOW (ref 36.0–46.0)
Hemoglobin: 11 g/dL — ABNORMAL LOW (ref 12.0–15.0)
MCH: 30.1 pg (ref 26.0–34.0)
MCHC: 31.2 g/dL (ref 30.0–36.0)
MCV: 96.4 fL (ref 80.0–100.0)
Platelet Count: 161 10*3/uL (ref 150–400)
RBC: 3.66 MIL/uL — ABNORMAL LOW (ref 3.87–5.11)
RDW: 12.5 % (ref 11.5–15.5)
WBC Count: 4.1 10*3/uL (ref 4.0–10.5)
nRBC: 0 % (ref 0.0–0.2)

## 2023-01-22 LAB — HEPATIC FUNCTION PANEL
ALT: 16 U/L (ref 0–44)
AST: 19 U/L (ref 15–41)
Albumin: 4.2 g/dL (ref 3.5–5.0)
Alkaline Phosphatase: 74 U/L (ref 38–126)
Bilirubin, Direct: 0.1 mg/dL (ref 0.0–0.2)
Indirect Bilirubin: 0.6 mg/dL (ref 0.3–0.9)
Total Bilirubin: 0.7 mg/dL (ref 0.3–1.2)
Total Protein: 6.8 g/dL (ref 6.5–8.1)

## 2023-01-22 LAB — HEPATITIS PANEL, ACUTE
HCV Ab: NONREACTIVE
Hep A IgM: NONREACTIVE
Hep B C IgM: NONREACTIVE
Hepatitis B Surface Ag: NONREACTIVE

## 2023-01-22 LAB — VITAMIN B12: Vitamin B-12: 1155 pg/mL — ABNORMAL HIGH (ref 180–914)

## 2023-01-22 LAB — FERRITIN: Ferritin: 655 ng/mL — ABNORMAL HIGH (ref 11–307)

## 2023-01-22 LAB — IRON AND TIBC
Iron: 97 ug/dL (ref 28–170)
Saturation Ratios: 33 % — ABNORMAL HIGH (ref 10.4–31.8)
TIBC: 293 ug/dL (ref 250–450)
UIBC: 196 ug/dL

## 2023-01-22 LAB — FOLATE: Folate: 22 ng/mL (ref >5.9)

## 2023-01-22 LAB — HIV ANTIBODY (ROUTINE TESTING W REFLEX): HIV Screen 4th Generation wRfx: NONREACTIVE

## 2023-01-22 NOTE — Assessment & Plan Note (Signed)
Previous workup showed normal LDH, no M protein on SPEP, normal light chain ratio. folate and B12. Repeat CBC today showed stable hemoglobin 11.

## 2023-01-22 NOTE — Assessment & Plan Note (Signed)
Discussed with patient that elevated ferritin could be secondary to acute/chronic inflammation, liver disease, abnormally high absorption of iron, etc.  She denies any previous history of frequent blood transfusion. Check hepatitis panel, HIV, check ultrasound right upper quadrant.

## 2023-01-22 NOTE — Progress Notes (Addendum)
Hematology/Oncology Progress note [phone] [phone]            Patient Care Team: Marjie Skiff, NP as PCP - General (Nurse Practitioner) Lysle Rubens, PA-C as Consulting Physician (Physician Assistant) Marcille Blanco, MD as Referring Physician (Surgery) Jerald Kief, PA-C as Physician Assistant (Physician Assistant)  CHIEF COMPLAINTS/REASON FOR VISIT:  Anemia and elevated ferritin.   ASSESSMENT & PLAN:   Elevated ferritin Discussed with patient that elevated ferritin could be secondary to acute/chronic inflammation, liver disease, abnormally high absorption of iron, etc.  She denies any previous history of frequent blood transfusion. Check hepatitis panel, HIV, check ultrasound right upper quadrant.  Normocytic anemia Previous workup showed normal LDH, no M protein on SPEP, normal light chain ratio. folate and B12. Repeat CBC today showed stable hemoglobin 11.  Orders Placed This Encounter  Procedures   US ABDOMEN LIMITED RUQ (LIVER/GB)    Standing Status:   Future    Number of Occurrences:   1    Standing Expiration Date:   01/22/2024    Order Specific Question:   Reason for Exam (SYMPTOM  OR DIAGNOSIS REQUIRED)    Answer:   Elevated ferritin    Order Specific Question:   Preferred imaging location?    Answer:   [facility]   Ferritin    Standing Status:   Future    Number of Occurrences:   1    Standing Expiration Date:   07/24/2023   Iron and TIBC    Standing Status:   Future    Number of Occurrences:   1    Standing Expiration Date:   01/22/2024   Hepatic function panel    Standing Status:   Future    Number of Occurrences:   1    Standing Expiration Date:   01/22/2024   Hepatitis panel, acute    Standing Status:   Future    Number of Occurrences:   1    Standing Expiration Date:   01/22/2024   HIV Antibody (routine testing w rflx)    Standing Status:   Future    Number of Occurrences:   1    Standing Expiration  Date:   01/22/2024   Hemochromatosis DNA-PCR(c282y,h63d)    Standing Status:   Future    Number of Occurrences:   1    Standing Expiration Date:   01/22/2024   Vitamin B12    Standing Status:   Future    Number of Occurrences:   1    Standing Expiration Date:   01/22/2024   Folate    Standing Status:   Future    Number of Occurrences:   1    Standing Expiration Date:   01/22/2024   CBC (Cancer Center Only)    Standing Status:   Future    Number of Occurrences:   1    Standing Expiration Date:   01/22/2024    All questions were answered. The patient knows to call the clinic with any problems, questions or concerns.  Rickard Patience, MD, PhD [facility] Hematology Oncology 01/22/2023   HISTORY OF PRESENTING ILLNESS:   [name] Robinson is a  72 y.o.  female with PMH listed below was seen in consultation at the request of  Aura Dials T, NP  for evaluation of anemia  11/09/2021, CBC showed Hemoglobin of 10, hematocrit 30.3, MCV 90, WBC 4.6, normal differential.  Normal platelet count. Ferritin 2223, TIBC 159, iron saturation 19, vitamin B12 888  Patient reports feeling fatigued  Patient is recovering from a prolonged hospitalization at [facility].  Extensive medical records review was performed by me.  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 04/15/2021,  patient had laparoscopic with small bowel resection.  06/10/2021, due to abdominal pain, nausea, vomiting, 06/27/2021, patient was admitted for exploratory laparoscopy, she was found to have a severe adhesion, patient underwent small bowel resection x3 with gastric bypass.  Patient had very complicated post surgery complications, including GJ anastomosis leak, bile peritonitis, intra-abdominal abscess status post multiple procedures for drainage, sepsis, acute respiratory failure, pleural effusion, diarrhea, vitamin deficiencies, left subcutaneous abscess status post drainage, cardiomyopathy,  A-fib, iron deficiency anemia due to melena, status post multiple units of blood transfusion, status post previous IV Venofer treatments. Patient was on tube feeding until mid late March 2023.  She tolerates regular diet none. Patient has been sacral ulcer with wound VAC in place.  + fatigue.  She denies any blood in the stool/melena.   INTERVAL HISTORY [name] Robinson is a 72 y.o. female who has above history reviewed by me today presents to reestablish care for evaluation of elevated ferritin. Patient denies any recent acute infection.  She takes Centrum multivitamin.  Denies any routine alcohol use. Denies any history of frequent blood transfusion.  She had 1 blood transfusion in September 2021.  Review of Systems  Constitutional:  Positive for fatigue. Negative for appetite change, chills and fever.  HENT:   Negative for hearing loss and voice change.   Eyes:  Negative for eye problems.  Respiratory:  Negative for chest tightness and cough.   Cardiovascular:  Negative for chest pain.  Gastrointestinal:  Negative for abdominal distention, abdominal pain and blood in stool.  Endocrine: Negative for hot flashes.  Genitourinary:  Negative for difficulty urinating and frequency.   Musculoskeletal:  Negative for arthralgias.  Skin:  Negative for itching and rash.  Neurological:  Negative for extremity weakness.  Hematological:  Negative for adenopathy.  Psychiatric/Behavioral:  Negative for confusion. The patient is not nervous/anxious.     MEDICAL HISTORY:  Past Medical History:  Diagnosis Date   Hypothyroidism    Morbid obesity (HCC) 11/2019   s/p Barriatric Sgx ([facility])   Sleep apnea    doesn't use CPAP, lost weight, not needed    SURGICAL HISTORY: Past Surgical History:  Procedure Laterality Date   ABDOMINAL HYSTERECTOMY     BARIATRIC SURGERY N/A 01/02/2020   duodenal switch   CERVICAL DISC SURGERY N/A    discectomy   COLONOSCOPY     ESOPHAGOGASTRODUODENOSCOPY   11/06/2020   EYE SURGERY Bilateral    laser procedure   LAPAROSCOPIC COLON RESECTION     MASS EXCISION Left 04/13/2021   Procedure: EXCISION MASS, left lateral thigh;  Surgeon: Campbell Lerner, MD;  Location: [facility] ORS;  Service: General;  Laterality: Left;   SHOULDER SURGERY Left    torn cartilege   TONSILLECTOMY N/A    TOTAL HIP ARTHROPLASTY Right 11/02/2020   TRANSTHORACIC ECHOCARDIOGRAM  07/14/2019   a) [facility]) Technically difficult.-Used Definity.  Nl LV Fxn -EF 55 - 60%.  No obvious WMA.  Mild MR.  No effusion;; b) Echo 04/16/2021-[facility]: (Fair quality) normal LV size and function.  EF 55-60%.  No R WMA.  Normal diastolic parameters.  Mild MR.  Unable to assess RVSP.    SOCIAL HISTORY: Social History   Socioeconomic History   Marital status: Married    Spouse name: Maurine Minister   Number of  children: 2   Years of education: Not on file   Highest education level: Some college, no degree  Occupational History   Not on file  Tobacco Use   Smoking status: Never   Smokeless tobacco: Never  Vaping Use   Vaping Use: Never used  Substance and Sexual Activity   Alcohol use: Never   Drug use: Never   Sexual activity: Not Currently  Other Topics Concern   Not on file  Social History Narrative   04/20/22 She is retired, lives with husband.    Otherwise, she is usually on the go, not sedentary.   Social Determinants of Health   Financial Resource Strain: Low Risk  (05/03/2022)   Overall Financial Resource Strain (CARDIA)    Difficulty of Paying Living Expenses: Not hard at all  Food Insecurity: No Food Insecurity (05/03/2022)   Hunger Vital Sign    Worried About Running Out of Food in the Last Year: Never true    Ran Out of Food in the Last Year: Never true  Transportation Needs: No Transportation Needs (05/03/2022)   PRAPARE - Administrator, Civil Service (Medical): No    Lack of Transportation (Non-Medical): No  Physical Activity: Insufficiently Active  (05/03/2022)   Exercise Vital Sign    Days of Exercise per Week: 4 days    Minutes of Exercise per Session: 30 min  Stress: No Stress Concern Present (05/03/2022)   [employer] of Occupational Health - Occupational Stress Questionnaire    Feeling of Stress : Not at all  Social Connections: Socially Integrated (05/03/2022)   Social Connection and Isolation Panel [NHANES]    Frequency of Communication with Friends and Family: More than three times a week    Frequency of Social Gatherings with Friends and Family: Twice a week    Attends Religious Services: More than 4 times per year    Active Member of [employer] or Organizations: Yes    Attends Engineer, structural: More than 4 times per year    Marital Status: Married  Catering manager Violence: Not At Risk (05/03/2022)   Humiliation, Afraid, Rape, and Kick questionnaire    Fear of Current or Ex-Partner: No    Emotionally Abused: No    Physically Abused: No    Sexually Abused: No    FAMILY HISTORY: Family History  Problem Relation Age of Onset   Thyroid disease Mother    Cancer - Lung Father    Cancer - Lung Brother    Cancer Brother     ALLERGIES:  is allergic to solifenacin.  MEDICATIONS:  Current Outpatient Medications  Medication Sig Dispense Refill   busPIRone (BUSPAR) 15 MG tablet Take 1 tablet (15 mg total) by mouth 2 (two) times daily. 180 tablet 4   citalopram (CELEXA) 20 MG tablet Take 20 mg by mouth daily.     ergocalciferol (VITAMIN D2) 1.25 MG (50000 UT) capsule Take 1 capsule (50,000 Units total) by mouth once a week. 12 capsule 5   gabapentin (NEURONTIN) 300 MG capsule Take one tablet (300 MG) by mouth in the morning and at noon, then 900 MG (three tablets) by mouth at night. (Patient taking differently: Take 300 mg by mouth 3 (three) times daily. Take one tablet (300 MG) by mouth in the morning and at noon, then 600 MG (two tablets) by mouth at night.) 450 capsule 4   levothyroxine (SYNTHROID) 88 MCG  tablet Take 1 tablet (88 mcg total) by mouth daily before breakfast. 90  tablet 1   Multiple Vitamin (MULTIVITAMIN) capsule Take 1 capsule by mouth daily.     traZODone (DESYREL) 50 MG tablet Take 2 tablets (100 mg total) by mouth at bedtime as needed. 180 tablet 2   vitamin B-12 (CYANOCOBALAMIN) 1000 MCG tablet Take 1,000 mcg by mouth daily.     No current facility-administered medications for this visit.     PHYSICAL EXAMINATION: ECOG PERFORMANCE STATUS: 1 - Symptomatic but completely ambulatory Vitals:   01/22/23 1105  BP: 120/69  Pulse: (!) 56  Resp: 18  Temp: 97.9 F (36.6 C)   Filed Weights   01/22/23 1105  Weight: 169 lb 12.8 oz (77 kg)    Physical Exam Constitutional:      General: She is not in acute distress.    Comments: Patient ambulates independently  HENT:     Head: Normocephalic and atraumatic.  Eyes:     General: No scleral icterus. Cardiovascular:     Rate and Rhythm: Normal rate and regular rhythm.     Heart sounds: Normal heart sounds.  Pulmonary:     Effort: Pulmonary effort is normal. No respiratory distress.     Breath sounds: No wheezing.  Abdominal:     General: Bowel sounds are normal. There is no distension.     Palpations: Abdomen is soft.  Musculoskeletal:        General: No deformity. Normal range of motion.     Cervical back: Normal range of motion and neck supple.  Skin:    General: Skin is warm and dry.     Findings: No erythema or rash.     Comments: + Wound VAC  Neurological:     Mental Status: She is alert and oriented to person, place, and time. Mental status is at baseline.     Cranial Nerves: No cranial nerve deficit.     Coordination: Coordination normal.  Psychiatric:        Mood and Affect: Mood normal.     LABORATORY DATA:  I have reviewed the data as listed     Latest Ref Rng & Units 01/22/2023   11:43 AM 01/16/2023    1:24 PM 08/29/2022    2:10 PM  CBC  WBC 4.0 - 10.5 K/uL 4.1  4.6  4.3   Hemoglobin 12.0 - 15.0  g/dL 16.1  09.6  04.5   Hematocrit 36.0 - 46.0 % 35.3  35.4  34.3   Platelets 150 - 400 K/uL 161  166  165       Latest Ref Rng & Units 01/22/2023   11:43 AM 01/16/2023    1:24 PM 08/29/2022    2:10 PM  CMP  Glucose 70 - 99 mg/dL  79  66   BUN 8 - 27 mg/dL  19  18   Creatinine 4.09 - 1.00 mg/dL  8.11  9.14   Sodium 782 - 144 mmol/L  142  144   Potassium 3.5 - 5.2 mmol/L  4.3  4.4   Chloride 96 - 106 mmol/L  105  106   CO2 20 - 29 mmol/L  26  25   Calcium 8.7 - 10.3 mg/dL  9.2  9.4   Total Protein 6.5 - 8.1 g/dL 6.8  6.5  6.3   Total Bilirubin 0.3 - 1.2 mg/dL 0.7  0.3  0.3   Alkaline Phos 38 - 126 U/L 74  97  103   AST 15 - 41 U/L 19  20  40   ALT 0 -  44 U/L 16  17  62     Iron/TIBC/Ferritin/ %Sat    Component Value Date/Time   IRON 97 01/22/2023 1143   IRON 83 01/16/2023 1324   TIBC 293 01/22/2023 1143   TIBC 255 08/29/2022 1410   FERRITIN 655 (H) 01/22/2023 1143   FERRITIN 857 (H) 01/16/2023 1324   IRONPCTSAT 33 (H) 01/22/2023 1143   IRONPCTSAT 31 08/29/2022 1410      RADIOGRAPHIC STUDIES: I have personally reviewed the radiological images as listed and agreed with the findings in the report. No results found.

## 2023-01-25 ENCOUNTER — Ambulatory Visit: Payer: Medicare HMO

## 2023-01-25 ENCOUNTER — Encounter: Payer: Self-pay | Admitting: Nurse Practitioner

## 2023-01-26 ENCOUNTER — Ambulatory Visit: Payer: Medicare HMO | Admitting: Nurse Practitioner

## 2023-01-30 ENCOUNTER — Ambulatory Visit
Admission: RE | Admit: 2023-01-30 | Discharge: 2023-01-30 | Disposition: A | Discharge status: Home / Self Care | Payer: Medicare HMO | Source: Ambulatory Visit | Attending: Oncology | Admitting: Oncology

## 2023-01-30 ENCOUNTER — Ambulatory Visit: Payer: Medicare HMO | Admitting: Nurse Practitioner

## 2023-01-30 DIAGNOSIS — R7989 Other specified abnormal findings of blood chemistry: Secondary | ICD-10-CM | POA: Diagnosis not present

## 2023-01-30 DIAGNOSIS — D649 Anemia, unspecified: Secondary | ICD-10-CM | POA: Diagnosis not present

## 2023-01-30 DIAGNOSIS — K838 Other specified diseases of biliary tract: Secondary | ICD-10-CM | POA: Diagnosis not present

## 2023-02-01 DIAGNOSIS — M25552 Pain in left hip: Secondary | ICD-10-CM | POA: Diagnosis not present

## 2023-02-01 DIAGNOSIS — M1612 Unilateral primary osteoarthritis, left hip: Secondary | ICD-10-CM | POA: Diagnosis not present

## 2023-02-02 ENCOUNTER — Other Ambulatory Visit: Payer: Self-pay | Admitting: Nurse Practitioner

## 2023-02-02 DIAGNOSIS — E063 Autoimmune thyroiditis: Secondary | ICD-10-CM

## 2023-02-02 MED ORDER — TRAZODONE HCL 50 MG PO TABS: 100.0000 mg | ORAL_TABLET | Freq: Every evening | ORAL | 2 refills | Status: DC | PRN

## 2023-02-02 MED ORDER — LEVOTHYROXINE SODIUM 88 MCG PO TABS: 88.0000 ug | ORAL_TABLET | Freq: Every day | ORAL | 4 refills | Status: DC

## 2023-02-02 NOTE — Telephone Encounter (Signed)
Patient call the office stating that she needs a medication refill for Levothyroxine 88  mg tablet and Trazodone 50 tablet. Please advise

## 2023-02-09 LAB — HEMOCHROMATOSIS DNA-PCR(C282Y,H63D)

## 2023-02-12 ENCOUNTER — Encounter: Payer: Self-pay | Admitting: Oncology

## 2023-02-12 ENCOUNTER — Inpatient Hospital Stay: Payer: Medicare HMO | Attending: Oncology | Admitting: Oncology

## 2023-02-12 VITALS — BP 113/63 | HR 60 | Temp 95.7°F | Resp 18 | Ht 67.0 in | Wt 169.0 lb

## 2023-02-12 DIAGNOSIS — R7989 Other specified abnormal findings of blood chemistry: Secondary | ICD-10-CM | POA: Insufficient documentation

## 2023-02-12 DIAGNOSIS — K838 Other specified diseases of biliary tract: Secondary | ICD-10-CM

## 2023-02-12 DIAGNOSIS — D649 Anemia, unspecified: Secondary | ICD-10-CM | POA: Insufficient documentation

## 2023-02-12 NOTE — Assessment & Plan Note (Addendum)
Discussed with patient that elevated ferritin could be secondary to acute/chronic inflammation, liver disease, abnormally high absorption of iron, etc.  She denies any previous history of frequent blood transfusion. Labs are reviewed and discussed with patient. Negative hepatitis panel, negative hemochromatosis gene mutations. Ferritin is trending down, could be due to chronic inflammation.   Recommend patient to avoid iron supplementation, decrease iron rich food and vitamin C rich food intake. She has anemia, not a good candidate for phlebotomy. She is not interested in chelator treatments I recommend expectant management. Repeat level is 6 months.

## 2023-02-12 NOTE — Assessment & Plan Note (Signed)
Check MRI MRCP of abdomen

## 2023-02-12 NOTE — Progress Notes (Signed)
Patient states that she is really tired.

## 2023-02-12 NOTE — Assessment & Plan Note (Signed)
Previous workup showed normal LDH, no M protein on SPEP, normal light chain ratio. folate and B12. Repeat CBC today showed stable hemoglobin 11. 

## 2023-02-12 NOTE — Progress Notes (Signed)
Hematology/Oncology Progress note [phone] [phone]            Patient Care Team: Marjie Skiff, NP as PCP - General (Nurse Practitioner) Lysle Rubens, PA-C as Consulting Physician (Physician Assistant) Marcille Blanco, MD as Referring Physician (Surgery) Jerald Kief, PA-C as Physician Assistant (Physician Assistant)  CHIEF COMPLAINTS/REASON FOR VISIT:  Anemia and elevated ferritin.   ASSESSMENT & PLAN:   Elevated ferritin Discussed with patient that elevated ferritin could be secondary to acute/chronic inflammation, liver disease, abnormally high absorption of iron, etc.  She denies any previous history of frequent blood transfusion. Labs are reviewed and discussed with patient. Negative hepatitis panel, negative hemochromatosis gene mutations. Ferritin is trending down, could be due to chronic inflammation.   Recommend patient to avoid iron supplementation, decrease iron rich food and vitamin C rich food intake. She has anemia, not a good candidate for phlebotomy. She is not interested in chelator treatments I recommend expectant management. Repeat level is 6 months.   Normocytic anemia Previous workup showed normal LDH, no M protein on SPEP, normal light chain ratio. folate and B12. Repeat CBC today showed stable hemoglobin 11.  Common bile duct dilation Check MRI MRCP of abdomen  Orders Placed This Encounter  Procedures   MR ABDOMEN MRCP W WO CONTAST    Standing Status:   Future    Standing Expiration Date:   02/12/2024    Order Specific Question:   If indicated for the ordered procedure, I authorize the administration of contrast media per Radiology protocol    Answer:   Yes    Order Specific Question:   What is the patient's sedation requirement?    Answer:   No Sedation    Order Specific Question:   Does the patient have a pacemaker or implanted devices?    Answer:   No    Order Specific Question:   Radiology Contrast Protocol  - do NOT remove file path    Answer:   [email or web address]    Order Specific Question:   Preferred imaging location?    Answer:   [facility] (table limit - 550lbs)   CBC with Differential (Cancer Center Only)    Standing Status:   Future    Standing Expiration Date:   02/12/2024   CMP (Cancer Center only)    Standing Status:   Future    Standing Expiration Date:   02/12/2024   Iron and TIBC    Standing Status:   Future    Standing Expiration Date:   02/12/2024   Ferritin    Standing Status:   Future    Standing Expiration Date:   02/12/2024   Folate    Standing Status:   Future    Standing Expiration Date:   02/12/2024   Vitamin B12    Standing Status:   Future    Standing Expiration Date:   02/12/2024   Sedimentation rate    Standing Status:   Future    Standing Expiration Date:   02/12/2024   C-reactive protein    Standing Status:   Future    Standing Expiration Date:   02/12/2024   Follow up in 6 months.  All questions were answered. The patient knows to call the clinic with any problems, questions or concerns.  Rickard Patience, MD, PhD [facility] Hematology Oncology 02/12/2023   HISTORY OF PRESENTING ILLNESS:   [name] Robinson is a  72 y.o.  female with PMH listed below was seen in consultation  at the request of  Aura Dials T, NP  for evaluation of anemia  11/09/2021, CBC showed Hemoglobin of 10, hematocrit 30.3, MCV 90, WBC 4.6, normal differential.  Normal platelet count. Ferritin 2223, TIBC 159, iron saturation 19, vitamin B12 888 Patient reports feeling fatigued  Patient is recovering from a prolonged hospitalization at [facility].  Extensive medical records review was performed by me.  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 04/15/2021,  patient had laparoscopic with small bowel resection.  06/10/2021, due to abdominal pain, nausea, vomiting, 06/27/2021, patient was admitted for  exploratory laparoscopy, she was found to have a severe adhesion, patient underwent small bowel resection x3 with gastric bypass.  Patient had very complicated post surgery complications, including GJ anastomosis leak, bile peritonitis, intra-abdominal abscess status post multiple procedures for drainage, sepsis, acute respiratory failure, pleural effusion, diarrhea, vitamin deficiencies, left subcutaneous abscess status post drainage, cardiomyopathy, A-fib, iron deficiency anemia due to melena, status post multiple units of blood transfusion, status post previous IV Venofer treatments. Patient was on tube feeding until mid late March 2023.  She tolerates regular diet none. Patient has been sacral ulcer with wound VAC in place.  + fatigue.  She denies any blood in the stool/melena.   INTERVAL HISTORY [name] Robinson is a 72 y.o. female who has above history reviewed by me today presents to reestablish care for evaluation of elevated ferritin. Patient denies any recent acute infection.  She takes Centrum multivitamin.  Denies any routine alcohol use. Denies any history of frequent blood transfusion.  She had 1 blood transfusion in September 2021.   INTERVAL HISTORY [name] Robinson is a 72 y.o. female who has above history reviewed by me today presents for follow up visit for elevated ferritin and anemia.  She has no new complaints.   Review of Systems  Constitutional:  Positive for fatigue. Negative for appetite change, chills and fever.  HENT:   Negative for hearing loss and voice change.   Eyes:  Negative for eye problems.  Respiratory:  Negative for chest tightness and cough.   Cardiovascular:  Negative for chest pain.  Gastrointestinal:  Negative for abdominal distention, abdominal pain and blood in stool.  Endocrine: Negative for hot flashes.  Genitourinary:  Negative for difficulty urinating and frequency.   Musculoskeletal:  Negative for arthralgias.  Skin:  Negative for itching and  rash.  Neurological:  Negative for extremity weakness.  Hematological:  Negative for adenopathy.  Psychiatric/Behavioral:  Negative for confusion. The patient is not nervous/anxious.     MEDICAL HISTORY:  Past Medical History:  Diagnosis Date   Hypothyroidism    Morbid obesity (HCC) 11/2019   s/p Barriatric Sgx ([facility])   Sleep apnea    doesn't use CPAP, lost weight, not needed    SURGICAL HISTORY: Past Surgical History:  Procedure Laterality Date   ABDOMINAL HYSTERECTOMY     BARIATRIC SURGERY N/A 01/02/2020   duodenal switch   CERVICAL DISC SURGERY N/A    discectomy   COLONOSCOPY     ESOPHAGOGASTRODUODENOSCOPY  11/06/2020   EYE SURGERY Bilateral    laser procedure   LAPAROSCOPIC COLON RESECTION     MASS EXCISION Left 04/13/2021   Procedure: EXCISION MASS, left lateral thigh;  Surgeon: Campbell Lerner, MD;  Location: [facility] ORS;  Service: General;  Laterality: Left;   SHOULDER SURGERY Left    torn cartilege   TONSILLECTOMY N/A    TOTAL HIP ARTHROPLASTY Right 11/02/2020   TRANSTHORACIC ECHOCARDIOGRAM  07/14/2019  a) [facility]) Technically difficult.-Used Definity.  Nl LV Fxn -EF 55 - 60%.  No obvious WMA.  Mild MR.  No effusion;; b) Echo 04/16/2021-[facility]: (Fair quality) normal LV size and function.  EF 55-60%.  No R WMA.  Normal diastolic parameters.  Mild MR.  Unable to assess RVSP.    SOCIAL HISTORY: Social History   Socioeconomic History   Marital status: Married    Spouse name: Maurine Minister   Number of children: 2   Years of education: Not on file   Highest education level: Some college, no degree  Occupational History   Not on file  Tobacco Use   Smoking status: Never   Smokeless tobacco: Never  Vaping Use   Vaping Use: Never used  Substance and Sexual Activity   Alcohol use: Never   Drug use: Never   Sexual activity: Not Currently  Other Topics Concern   Not on file  Social History Narrative   04/20/22 She is retired, lives with husband.     Otherwise, she is usually on the go, not sedentary.   Social Determinants of Health   Financial Resource Strain: Low Risk  (05/03/2022)   Overall Financial Resource Strain (CARDIA)    Difficulty of Paying Living Expenses: Not hard at all  Food Insecurity: No Food Insecurity (05/03/2022)   Hunger Vital Sign    Worried About Running Out of Food in the Last Year: Never true    Ran Out of Food in the Last Year: Never true  Transportation Needs: No Transportation Needs (05/03/2022)   PRAPARE - Administrator, Civil Service (Medical): No    Lack of Transportation (Non-Medical): No  Physical Activity: Insufficiently Active (05/03/2022)   Exercise Vital Sign    Days of Exercise per Week: 4 days    Minutes of Exercise per Session: 30 min  Stress: No Stress Concern Present (05/03/2022)   [employer] of Occupational Health - Occupational Stress Questionnaire    Feeling of Stress : Not at all  Social Connections: Socially Integrated (05/03/2022)   Social Connection and Isolation Panel [NHANES]    Frequency of Communication with Friends and Family: More than three times a week    Frequency of Social Gatherings with Friends and Family: Twice a week    Attends Religious Services: More than 4 times per year    Active Member of [employer] or Organizations: Yes    Attends Engineer, structural: More than 4 times per year    Marital Status: Married  Catering manager Violence: Not At Risk (05/03/2022)   Humiliation, Afraid, Rape, and Kick questionnaire    Fear of Current or Ex-Partner: No    Emotionally Abused: No    Physically Abused: No    Sexually Abused: No    FAMILY HISTORY: Family History  Problem Relation Age of Onset   Thyroid disease Mother    Cancer - Lung Father    Cancer - Lung Brother    Cancer Brother     ALLERGIES:  is allergic to solifenacin.  MEDICATIONS:  Current Outpatient Medications  Medication Sig Dispense Refill   busPIRone (BUSPAR) 15 MG tablet  Take 1 tablet (15 mg total) by mouth 2 (two) times daily. 180 tablet 4   citalopram (CELEXA) 20 MG tablet Take 20 mg by mouth daily.     ergocalciferol (VITAMIN D2) 1.25 MG (50000 UT) capsule Take 1 capsule (50,000 Units total) by mouth once a week. 12 capsule 5   gabapentin (NEURONTIN) 300 MG  capsule Take one tablet (300 MG) by mouth in the morning and at noon, then 900 MG (three tablets) by mouth at night. (Patient taking differently: Take 300 mg by mouth 3 (three) times daily. Take one tablet (300 MG) by mouth in the morning and at noon, then 600 MG (two tablets) by mouth at night.) 450 capsule 4   levothyroxine (SYNTHROID) 88 MCG tablet Take 1 tablet (88 mcg total) by mouth daily before breakfast. 90 tablet 4   Multiple Vitamin (MULTIVITAMIN) capsule Take 1 capsule by mouth daily.     traZODone (DESYREL) 50 MG tablet Take 2 tablets (100 mg total) by mouth at bedtime as needed. 180 tablet 2   vitamin B-12 (CYANOCOBALAMIN) 1000 MCG tablet Take 1,000 mcg by mouth daily.     No current facility-administered medications for this visit.     PHYSICAL EXAMINATION: ECOG PERFORMANCE STATUS: 1 - Symptomatic but completely ambulatory Vitals:   02/12/23 1039  BP: 113/63  Pulse: 60  Resp: 18  Temp: (!) 95.7 F (35.4 C)  SpO2: 99%   Filed Weights   02/12/23 1039  Weight: 169 lb (76.7 kg)    Physical Exam Constitutional:      General: She is not in acute distress.    Comments: Patient ambulates independently  HENT:     Head: Normocephalic and atraumatic.  Eyes:     General: No scleral icterus. Cardiovascular:     Rate and Rhythm: Normal rate and regular rhythm.  Pulmonary:     Effort: Pulmonary effort is normal. No respiratory distress.  Abdominal:     General: Bowel sounds are normal. There is no distension.     Palpations: Abdomen is soft.  Musculoskeletal:        General: No deformity. Normal range of motion.     Cervical back: Normal range of motion and neck supple.  Skin:     General: Skin is warm and dry.     Findings: No erythema or rash.  Neurological:     Mental Status: She is alert and oriented to person, place, and time. Mental status is at baseline.     Cranial Nerves: No cranial nerve deficit.  Psychiatric:        Mood and Affect: Mood normal.     LABORATORY DATA:  I have reviewed the data as listed     Latest Ref Rng & Units 01/22/2023   11:43 AM 01/16/2023    1:24 PM 08/29/2022    2:10 PM  CBC  WBC 4.0 - 10.5 K/uL 4.1  4.6  4.3   Hemoglobin 12.0 - 15.0 g/dL 16.1  09.6  04.5   Hematocrit 36.0 - 46.0 % 35.3  35.4  34.3   Platelets 150 - 400 K/uL 161  166  165       Latest Ref Rng & Units 01/22/2023   11:43 AM 01/16/2023    1:24 PM 08/29/2022    2:10 PM  CMP  Glucose 70 - 99 mg/dL  79  66   BUN 8 - 27 mg/dL  19  18   Creatinine 4.09 - 1.00 mg/dL  8.11  9.14   Sodium 782 - 144 mmol/L  142  144   Potassium 3.5 - 5.2 mmol/L  4.3  4.4   Chloride 96 - 106 mmol/L  105  106   CO2 20 - 29 mmol/L  26  25   Calcium 8.7 - 10.3 mg/dL  9.2  9.4   Total Protein 6.5 - 8.1  g/dL 6.8  6.5  6.3   Total Bilirubin 0.3 - 1.2 mg/dL 0.7  0.3  0.3   Alkaline Phos 38 - 126 U/L 74  97  103   AST 15 - 41 U/L 19  20  40   ALT 0 - 44 U/L 16  17  62     Iron/TIBC/Ferritin/ %Sat    Component Value Date/Time   IRON 97 01/22/2023 1143   IRON 83 01/16/2023 1324   TIBC 293 01/22/2023 1143   TIBC 255 08/29/2022 1410   FERRITIN 655 (H) 01/22/2023 1143   FERRITIN 857 (H) 01/16/2023 1324   IRONPCTSAT 33 (H) 01/22/2023 1143   IRONPCTSAT 31 08/29/2022 1410      RADIOGRAPHIC STUDIES: I have personally reviewed the radiological images as listed and agreed with the findings in the report. US ABDOMEN LIMITED RUQ (LIVER/GB)  Result Date: 01/30/2023 CLINICAL DATA:  Elevated ferritin. EXAM: ULTRASOUND ABDOMEN LIMITED RIGHT UPPER QUADRANT COMPARISON:  CT abdomen pelvis 05/21/2021 FINDINGS: Gallbladder: No gallstones or wall thickening visualized. No sonographic Murphy sign  noted by sonographer. Common bile duct: Diameter: 8.5 mm, dilated. Liver: No focal lesion identified. Within normal limits in parenchymal echogenicity. Portal vein is patent on color Doppler imaging with normal direction of blood flow towards the liver. Other: None. IMPRESSION: 1. No cholelithiasis or sonographic evidence for acute cholecystitis. 2. Common bile duct is dilated measuring up to 8.5 mm. Recommend further evaluation with LFTs. If suggestion of obstruction, recommend MRI/MRCP. Electronically Signed   By: Annia Belt M.D.   On: 01/30/2023 09:20

## 2023-02-13 ENCOUNTER — Telehealth: Payer: Self-pay | Admitting: Nurse Practitioner

## 2023-02-13 NOTE — Telephone Encounter (Signed)
Copied from CRM [id number]. Topic: General - Other >> Feb 13, 2023 12:11 PM Carrielelia G wrote: Pt. [name] Robinson stated she was calling back from a missed call  ( i did not see a msg in her chart, my apologies if I overlooked it)

## 2023-02-13 NOTE — Telephone Encounter (Signed)
Print production planner has returned call

## 2023-02-26 ENCOUNTER — Ambulatory Visit
Admission: RE | Admit: 2023-02-26 | Discharge: 2023-02-26 | Disposition: A | Discharge status: Home / Self Care | Payer: Medicare HMO | Source: Ambulatory Visit | Attending: Oncology | Admitting: Oncology

## 2023-02-26 DIAGNOSIS — K838 Other specified diseases of biliary tract: Secondary | ICD-10-CM | POA: Insufficient documentation

## 2023-02-26 DIAGNOSIS — N281 Cyst of kidney, acquired: Secondary | ICD-10-CM | POA: Diagnosis not present

## 2023-02-26 DIAGNOSIS — K8020 Calculus of gallbladder without cholecystitis without obstruction: Secondary | ICD-10-CM | POA: Diagnosis not present

## 2023-02-26 MED ORDER — GADOBUTROL 1 MMOL/ML IV SOLN
7.5000 mL | Freq: Once | INTRAVENOUS | Status: AC | PRN
  Administered 2023-02-26: 7.5 mL via INTRAVENOUS

## 2023-03-05 ENCOUNTER — Telehealth: Payer: Self-pay

## 2023-03-05 DIAGNOSIS — N281 Cyst of kidney, acquired: Secondary | ICD-10-CM

## 2023-03-05 NOTE — Telephone Encounter (Signed)
-----   Message from Rickard Patience sent at 03/03/2023  2:57 PM EDT ----- Please let patient know that MRI showed 10 mm complex cystic of right kidney Recommend urology evaluation. Please refer.

## 2023-03-05 NOTE — Telephone Encounter (Signed)
Called pt, no answer. VM full. Mychart message sent with MD recommendation.

## 2023-03-07 ENCOUNTER — Encounter: Payer: Self-pay | Admitting: Nurse Practitioner

## 2023-03-07 DIAGNOSIS — E063 Autoimmune thyroiditis: Secondary | ICD-10-CM

## 2023-03-09 NOTE — Telephone Encounter (Signed)
Lab appointment has been scheduled 

## 2023-03-14 ENCOUNTER — Other Ambulatory Visit: Payer: Medicare HMO

## 2023-03-14 DIAGNOSIS — E063 Autoimmune thyroiditis: Secondary | ICD-10-CM

## 2023-03-15 NOTE — Progress Notes (Signed)
Contacted via MyChart

## 2023-03-15 NOTE — Progress Notes (Signed)
Good morning, patient's thyroid labs have returned:)

## 2023-03-16 ENCOUNTER — Telehealth: Payer: Self-pay | Admitting: Nurse Practitioner

## 2023-03-16 NOTE — Telephone Encounter (Signed)
Yes, those will be fine.  Dont think she would like me too much if I made her repeat them.

## 2023-03-16 NOTE — Telephone Encounter (Signed)
Pt did labs yesterday and had to reschedule her appt form 8/7 to 9/14.  Will labs still be okay?

## 2023-03-16 NOTE — Telephone Encounter (Signed)
Called and let pt know labs would be fine for appt in sept

## 2023-03-18 ENCOUNTER — Encounter: Payer: Self-pay | Admitting: Nurse Practitioner

## 2023-03-19 ENCOUNTER — Other Ambulatory Visit: Payer: Medicare HMO

## 2023-03-19 DIAGNOSIS — F4310 Post-traumatic stress disorder, unspecified: Secondary | ICD-10-CM | POA: Diagnosis not present

## 2023-03-21 ENCOUNTER — Ambulatory Visit: Payer: Medicare HMO | Admitting: Nurse Practitioner

## 2023-03-28 ENCOUNTER — Ambulatory Visit: Payer: Medicare HMO | Admitting: Urology

## 2023-03-28 ENCOUNTER — Encounter: Payer: Self-pay | Admitting: Urology

## 2023-03-28 VITALS — BP 129/76 | HR 65 | Ht 67.0 in | Wt 165.0 lb

## 2023-03-28 DIAGNOSIS — R8281 Pyuria: Secondary | ICD-10-CM | POA: Diagnosis not present

## 2023-03-28 DIAGNOSIS — N281 Cyst of kidney, acquired: Secondary | ICD-10-CM | POA: Diagnosis not present

## 2023-03-28 LAB — URINALYSIS, COMPLETE
Bilirubin, UA: NEGATIVE
Glucose, UA: NEGATIVE
Ketones, UA: NEGATIVE
Leukocytes,UA: NEGATIVE
Nitrite, UA: NEGATIVE
Specific Gravity, UA: 1.025 (ref 1.005–1.030)
Urobilinogen, Ur: 0.2 mg/dL (ref 0.2–1.0)
pH, UA: 5.5 (ref 5.0–7.5)

## 2023-03-28 LAB — MICROSCOPIC EXAMINATION

## 2023-03-28 NOTE — Progress Notes (Signed)
I, Duke Salvia, acting as a Neurosurgeon for [name] Altes, MD., have documented all relevant documentation on the behalf of [name] Altes, MD, as directed by  [name] Altes, MD while in the presence of [name] Altes, MD.  03/28/2023 2:33 PM   [name] 1951-06-09 [id number]  Referring provider: Rickard Patience, MD [address] [place],  [place] [address]  Chief Complaint  Patient presents with   Other    Cyst    HPI: [name] Robinson is a 72 y.o. female referred for evaluation of complex renal cyst.  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 No prior history of urologic problems. Denies flank, abdominal, or pelvic pain.   PMH: Past Medical History:  Diagnosis Date   Hypothyroidism    Morbid obesity (HCC) 11/2019   s/p Barriatric Sgx ([facility])   Sleep apnea    doesn't use CPAP, lost weight, not needed    Surgical History: Past Surgical History:  Procedure Laterality Date   ABDOMINAL HYSTERECTOMY     BARIATRIC SURGERY N/A 01/02/2020   duodenal switch   CERVICAL DISC SURGERY N/A    discectomy   COLONOSCOPY     ESOPHAGOGASTRODUODENOSCOPY  11/06/2020   EYE SURGERY Bilateral    laser procedure   LAPAROSCOPIC COLON RESECTION     MASS EXCISION Left 04/13/2021   Procedure: EXCISION MASS, left lateral thigh;  Surgeon: Campbell Lerner, MD;  Location: [facility] ORS;  Service: General;  Laterality: Left;   SHOULDER SURGERY Left    torn cartilege   TONSILLECTOMY N/A    TOTAL HIP ARTHROPLASTY Right 11/02/2020   TRANSTHORACIC ECHOCARDIOGRAM  07/14/2019   a) [facility]) Technically difficult.-Used Definity.  Nl LV Fxn -EF 55 - 60%.  No obvious WMA.  Mild MR.  No effusion;; b) Echo 04/16/2021-[facility]: (Fair quality) normal LV size and function.  EF 55-60%.  No R WMA.  Normal diastolic parameters.  Mild MR.  Unable to assess RVSP.     Home Medications:  Allergies as of 03/28/2023       Reactions   Solifenacin Rash   Patient had bad rash with this med.        Medication List        Accurate as of March 28, 2023  2:33 PM. If you have any questions, ask your nurse or doctor.          busPIRone 15 MG tablet Commonly known as: BUSPAR Take 1 tablet (15 mg total) by mouth 2 (two) times daily.   citalopram 20 MG tablet Commonly known as: CELEXA Take 20 mg by mouth daily.   cyanocobalamin 1000 MCG tablet Commonly known as: VITAMIN B12 Take 1,000 mcg by mouth daily.   ergocalciferol 1.25 MG (50000 UT) capsule Commonly known as: VITAMIN D2 Take 1 capsule (50,000 Units total) by mouth once a week.   gabapentin 300 MG capsule Commonly known as: NEURONTIN Take one tablet (300 MG) by mouth in the morning and at noon, then 900 MG (three tablets) by mouth at night. What changed:  how much to take how to take this when to take this additional instructions   levothyroxine 88 MCG tablet Commonly known as: SYNTHROID Take 1 tablet (88 mcg total) by mouth daily before breakfast.   multivitamin capsule Take 1 capsule by mouth daily.   traZODone 50 MG tablet Commonly known as:  DESYREL Take 2 tablets (100 mg total) by mouth at bedtime as needed.        Allergies:  Allergies  Allergen Reactions   Solifenacin Rash    Patient had bad rash with this med.    Family History: Family History  Problem Relation Age of Onset   Thyroid disease Mother    Cancer - Lung Father    Cancer - Lung Brother    Cancer Brother     Social History:  reports that she has never smoked. She has never used smokeless tobacco. She reports that she does not drink alcohol and does not use drugs.   Physical Exam: BP 129/76   Pulse 65   Ht 5\' 7"  (1.702 m)   Wt 165 lb (74.8 kg)   BMI 25.84 kg/m   Constitutional:  Alert and oriented, No acute distress. HEENT: [place] AT, moist mucus membranes.  Trachea midline, no  masses. Cardiovascular: No clubbing, cyanosis, or edema. Respiratory: Normal respiratory effort, no increased work of breathing. GI: Abdomen is soft, nontender, nondistended, no abdominal masses Skin: No rashes, bruises or suspicious lesions. Neurologic: Grossly intact, no focal deficits, moving all 4 extremities. Psychiatric: Normal mood and affect.    Laboratory Data: Urinalysis Dipstick trace blood, microscopy 11-30 WBC/calcium oxalate crystals.    Pertinent Imaging: MRI abdomen 02/26/23 was personally reviewed and interpreted.    Assessment & Plan:    1. 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 Based on location, partial nephrectomy would be difficult and she is not interested in pursuing surgery. Active surveillance and percutaneous ablation was discussed and she is most interested in percutaneous ablation. We'll schedule interventional radiology appointment to discuss further.  2. Pyuria Urine culture ordered.  [facility] [address] [place], [place] [address] [phone]

## 2023-03-29 ENCOUNTER — Other Ambulatory Visit: Payer: Self-pay | Admitting: Urology

## 2023-03-29 DIAGNOSIS — N281 Cyst of kidney, acquired: Secondary | ICD-10-CM

## 2023-04-01 LAB — CULTURE, URINE COMPREHENSIVE

## 2023-04-02 ENCOUNTER — Other Ambulatory Visit: Payer: Self-pay | Admitting: *Deleted

## 2023-04-02 MED ORDER — CIPROFLOXACIN HCL 250 MG PO TABS: 250.0000 mg | ORAL_TABLET | Freq: Two times a day (BID) | ORAL | 0 refills | Status: AC

## 2023-04-05 ENCOUNTER — Ambulatory Visit
Admission: RE | Admit: 2023-04-05 | Discharge: 2023-04-05 | Disposition: A | Discharge status: Home / Self Care | Payer: Medicare HMO | Source: Ambulatory Visit | Attending: Urology | Admitting: Urology

## 2023-04-05 DIAGNOSIS — N281 Cyst of kidney, acquired: Secondary | ICD-10-CM

## 2023-04-05 DIAGNOSIS — N2889 Other specified disorders of kidney and ureter: Secondary | ICD-10-CM | POA: Diagnosis not present

## 2023-04-05 HISTORY — PX: IR RADIOLOGIST EVAL & MGMT: IMG5224

## 2023-04-05 NOTE — H&P (Signed)
Interventional Radiology - Clinic Visit, Initial H&P    Referring Provider: Riki Altes, MD  Reason for Visit: Right kidney lesion     History of Present Illness  [name] Robinson is a 72 y.o. female with a relevant past medical history of complicated bariatric surgery seen today in Interventional Radiology clinic for new right renal lesion seen on recent MRI.  The patient was seen today for further evaluation and management of a right renal lesion.  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  Patient denies any current urinary symptoms.  She otherwise reports feeling in her normal state of health.    Retrospective review of CT imaging from October 2022 demonstrates a similar size of a hypodense lesion in the right kidney.     Additional Past Medical History Past Medical History:  Diagnosis Date   Hypothyroidism    Morbid obesity (HCC) 11/2019   s/p Barriatric Sgx ([facility])   Sleep apnea    doesn't use CPAP, lost weight, not needed     Surgical History  Past Surgical History:  Procedure Laterality Date   ABDOMINAL HYSTERECTOMY     BARIATRIC SURGERY N/A 01/02/2020   duodenal switch   CERVICAL DISC SURGERY N/A    discectomy   COLONOSCOPY     ESOPHAGOGASTRODUODENOSCOPY  11/06/2020   EYE SURGERY Bilateral    laser procedure   LAPAROSCOPIC COLON RESECTION     MASS EXCISION Left 04/13/2021   Procedure: EXCISION MASS, left lateral thigh;  Surgeon: Campbell Lerner, MD;  Location: [facility] ORS;  Service: General;  Laterality: Left;   SHOULDER SURGERY Left    torn cartilege   TONSILLECTOMY N/A    TOTAL HIP ARTHROPLASTY Right 11/02/2020   TRANSTHORACIC ECHOCARDIOGRAM  07/14/2019   a) [facility]) Technically difficult.-Used Definity.  Nl LV Fxn -EF 55 - 60%.  No  obvious WMA.  Mild MR.  No effusion;; b) Echo 04/16/2021-[facility]: (Fair quality) normal LV size and function.  EF 55-60%.  No R WMA.  Normal diastolic parameters.  Mild MR.  Unable to assess RVSP.     Medications  I have reviewed the current medication list. Refer to chart for details. Current Outpatient Medications  Medication Instructions   busPIRone (BUSPAR) 15 mg, Oral, 2 times daily   ciprofloxacin (CIPRO) 250 mg, Oral, 2 times daily   citalopram (CELEXA) 20 mg, Oral, Daily   cyanocobalamin (VITAMIN B12) 1,000 mcg, Oral, Daily   ergocalciferol (VITAMIN D2) 50,000 Units, Oral, Weekly   gabapentin (NEURONTIN) 300 MG capsule Take one tablet (300 MG) by mouth in the morning and at noon, then 900 MG (three tablets) by mouth at night.   levothyroxine (SYNTHROID) 88 mcg, Oral, Daily before breakfast   Multiple Vitamin (MULTIVITAMIN) capsule 1 capsule, Oral, Daily   traZODone (DESYREL) 100 mg, Oral, At bedtime PRN      Allergies Allergies  Allergen Reactions   Solifenacin Rash    Patient had bad rash with this med.   Does patient have contrast allergy: No     Physical Exam Current Vitals Temp: 98.3 F (36.8 C) (Temp Source: Oral)  Pulse Rate: (!) 55  Resp: 14  BP: 134/75  SpO2: 96 %        There is no height or weight on file to calculate BMI.  General: Alert  and answers questions appropriately. No apparent distress. HEENT: Normocephalic, atraumatic. Conjunctivae normal without scleral icterus. Cardiac: Regular rate and rhythm. No dependent edema. Pulmonary: Normal work of breathing. On room air. Abdominal: Soft without distension. Extremities: Normally-formed, well perfused.    Pertinent Lab Results    Latest Ref Rng & Units 01/22/2023   11:43 AM 01/16/2023    1:24 PM 08/29/2022    2:10 PM  CBC  WBC 4.0 - 10.5 K/uL 4.1  4.6  4.3   Hemoglobin 12.0 - 15.0 g/dL 16.1  09.6  04.5   Hematocrit 36.0 - 46.0 % 35.3  35.4  34.3   Platelets 150 - 400 K/uL 161  166  165        Latest Ref Rng & Units 01/22/2023   11:43 AM 01/16/2023    1:24 PM 08/29/2022    2:10 PM  CMP  Glucose 70 - 99 mg/dL  79  66   BUN 8 - 27 mg/dL  19  18   Creatinine 4.09 - 1.00 mg/dL  8.11  9.14   Sodium 782 - 144 mmol/L  142  144   Potassium 3.5 - 5.2 mmol/L  4.3  4.4   Chloride 96 - 106 mmol/L  105  106   CO2 20 - 29 mmol/L  26  25   Calcium 8.7 - 10.3 mg/dL  9.2  9.4   Total Protein 6.5 - 8.1 g/dL 6.8  6.5  6.3   Total Bilirubin 0.3 - 1.2 mg/dL 0.7  0.3  0.3   Alkaline Phos 38 - 126 U/L 74  97  103   AST 15 - 41 U/L 19  20  40   ALT 0 - 44 U/L 16  17  62       Relevant and/or Recent Imaging: MRI February 26 2023  Personally reviewed per HPI    Assessment & Plan [name] Robinson is a 72 y.o. 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   Treatment options include surveillance vs CT-guided cryoablation. The risks/benefits for each of these were discussed with the patient and all questions answered.   Given its small size and apparent slow growth demonstrated on her available imaging, the patient was in agreement to pursue surveillance only at this time, which I think is reasonable.    Plan:  Follow up in IR clinic in 12 months with repeat MRI renal protocol    Total time spent on today's visit was over 40 Minutes, including both face-to-face time and non face-to-face time, personally spent on review of chart (including labs and relevant imaging), discussing further workup and treatment options, referral to specialist if needed, reviewing outside records if pertinent, answering patient questions, and coordinating care regarding right renal lesion as well as management strategy.      Olive Bass, MD  Vascular and Interventional Radiology 04/05/2023 10:43 AM

## 2023-05-02 ENCOUNTER — Other Ambulatory Visit: Payer: Self-pay | Admitting: Nurse Practitioner

## 2023-05-03 ENCOUNTER — Encounter: Payer: Self-pay | Admitting: Nurse Practitioner

## 2023-05-03 ENCOUNTER — Ambulatory Visit: Payer: Medicare HMO | Admitting: Nurse Practitioner

## 2023-05-03 VITALS — BP 108/64 | HR 54 | Ht 67.0 in | Wt 181.6 lb

## 2023-05-03 DIAGNOSIS — E038 Other specified hypothyroidism: Secondary | ICD-10-CM | POA: Diagnosis not present

## 2023-05-03 DIAGNOSIS — E063 Autoimmune thyroiditis: Secondary | ICD-10-CM

## 2023-05-03 MED ORDER — LEVOTHYROXINE SODIUM 88 MCG PO TABS: 88.0000 ug | ORAL_TABLET | Freq: Every day | ORAL | 3 refills | Status: DC

## 2023-05-03 NOTE — Progress Notes (Signed)
Endocrinology Follow Up Note                                         05/03/2023, 3:03 PM  Subjective:   Subjective    [name] Robinson is a 72 y.o.-year-old female patient being seen in follow up after being seen in consultation for hypothyroidism referred by [name] Skiff, NP.   Past Medical History:  Diagnosis Date   Hypothyroidism    Morbid obesity (HCC) 11/2019   s/p Barriatric Sgx ([facility])   Sleep apnea    doesn't use CPAP, lost weight, not needed    Past Surgical History:  Procedure Laterality Date   ABDOMINAL HYSTERECTOMY     BARIATRIC SURGERY N/A 01/02/2020   duodenal switch   CERVICAL DISC SURGERY N/A    discectomy   COLONOSCOPY     ESOPHAGOGASTRODUODENOSCOPY  11/06/2020   EYE SURGERY Bilateral    laser procedure   IR RADIOLOGIST EVAL & MGMT  04/05/2023   LAPAROSCOPIC COLON RESECTION     MASS EXCISION Left 04/13/2021   Procedure: EXCISION MASS, left lateral thigh;  Surgeon: Campbell Lerner, MD;  Location: [facility] ORS;  Service: General;  Laterality: Left;   SHOULDER SURGERY Left    torn cartilege   TONSILLECTOMY N/A    TOTAL HIP ARTHROPLASTY Right 11/02/2020   TRANSTHORACIC ECHOCARDIOGRAM  07/14/2019   a) [facility]) Technically difficult.-Used Definity.  Nl LV Fxn -EF 55 - 60%.  No obvious WMA.  Mild MR.  No effusion;; b) Echo 04/16/2021-[facility]: (Fair quality) normal LV size and function.  EF 55-60%.  No R WMA.  Normal diastolic parameters.  Mild MR.  Unable to assess RVSP.    Social History   Socioeconomic History   Marital status: Married    Spouse name: [name] Robinson   Number of children: 2   Years of education: Not on file   Highest education level: Some college, no degree  Occupational History   Not on file  Tobacco Use   Smoking status: Never   Smokeless tobacco: Never  Vaping Use   Vaping status: Never Used  Substance and Sexual Activity   Alcohol use: Never   Drug use: Never    Sexual activity: Not Currently  Other Topics Concern   Not on file  Social History Narrative   04/20/22 She is retired, lives with husband.    Otherwise, she is usually on the go, not sedentary.   Social Determinants of Health   Financial Resource Strain: Low Risk  (05/03/2022)   Overall Financial Resource Strain (CARDIA)    Difficulty of Paying Living Expenses: Not hard at all  Food Insecurity: No Food Insecurity (05/03/2022)   Hunger Vital Sign    Worried About Running Out of Food in the Last Year: Never true    Ran Out of Food in the Last Year: Never true  Transportation Needs: No Transportation Needs (05/03/2022)   PRAPARE - Administrator, Civil Service (Medical): No    Lack of Transportation (Non-Medical):  No  Physical Activity: Insufficiently Active (05/03/2022)   Exercise Vital Sign    Days of Exercise per Week: 4 days    Minutes of Exercise per Session: 30 min  Stress: No Stress Concern Present (05/03/2022)   [employer] of Occupational Health - Occupational Stress Questionnaire    Feeling of Stress : Not at all  Social Connections: Socially Integrated (05/03/2022)   Social Connection and Isolation Panel [NHANES]    Frequency of Communication with Friends and Family: More than three times a week    Frequency of Social Gatherings with Friends and Family: Twice a week    Attends Religious Services: More than 4 times per year    Active Member of [employer] or Organizations: Yes    Attends Engineer, structural: More than 4 times per year    Marital Status: Married    Family History  Problem Relation Age of Onset   Thyroid disease Mother    Cancer - Lung Father    Cancer - Lung Brother    Cancer Brother     Outpatient Encounter Medications as of 05/03/2023  Medication Sig   busPIRone (BUSPAR) 15 MG tablet Take 1 tablet (15 mg total) by mouth 2 (two) times daily.   citalopram (CELEXA) 20 MG tablet Take 20 mg by mouth daily.   ergocalciferol (VITAMIN  D2) 1.25 MG (50000 UT) capsule Take 1 capsule (50,000 Units total) by mouth once a week.   gabapentin (NEURONTIN) 300 MG capsule Take one tablet (300 MG) by mouth in the morning and at noon, then 900 MG (three tablets) by mouth at night. (Patient taking differently: Take 300 mg by mouth 3 (three) times daily. Take one tablet (300 MG) by mouth in the morning and at noon, then 600 MG (two tablets) by mouth at night.)   Multiple Vitamin (MULTIVITAMIN) capsule Take 1 capsule by mouth daily.   traZODone (DESYREL) 50 MG tablet Take 2 tablets (100 mg total) by mouth at bedtime as needed.   vitamin B-12 (CYANOCOBALAMIN) 1000 MCG tablet Take 1,000 mcg by mouth daily.   [DISCONTINUED] levothyroxine (SYNTHROID) 88 MCG tablet Take 1 tablet (88 mcg total) by mouth daily before breakfast.   levothyroxine (SYNTHROID) 88 MCG tablet Take 1 tablet (88 mcg total) by mouth daily before breakfast.   No facility-administered encounter medications on file as of 05/03/2023.    ALLERGIES: Allergies  Allergen Reactions   Solifenacin Rash    Patient had bad rash with this med.   VACCINATION STATUS: Immunization History  Administered Date(s) Administered   Fluad Quad(high Dose 65+) 05/20/2021, 05/29/2022   Influenza Inj Mdck Quad Pf 05/29/2017   Influenza, High Dose Seasonal PF 05/17/2018, 05/07/2019   Influenza,inj,Quad PF,6+ Mos 06/18/2015, 05/19/2016, 05/07/2019   Influenza-Unspecified 05/29/2017, 06/16/2020   PFIZER(Purple Top)SARS-COV-2 Vaccination 10/11/2019, 11/05/2019   PPD Test 01/04/2021   Pneumococcal Conjugate-13 05/07/2019   Td 02/28/2021   Tdap 06/21/2003     HPI   [name] Robinson  is a patient with the above medical history. she was diagnosed with hypothyroidism years ago, which required subsequent initiation of thyroid hormone replacement. she was given various doses of Levothyroxine over the years, currently on 25 micrograms. she reports compliance to this medication:  Taking it daily on empty  stomach  with water, separated by >30 minutes before breakfast and other medications , and by at least 4 hours from calcium, iron, PPIs, multivitamins.  She notes she was hospitalized for sepsis last year and has since developed a constant  tremor in which she has been trying to solve.  She weaned herself off Fentanyl and that did not help her tremors.  I reviewed patient's thyroid tests:  Lab Results  Component Value Date   TSH 1.300 03/14/2023   TSH 0.823 01/16/2023   TSH 1.790 11/08/2022   TSH 3.770 08/08/2022   TSH 14.500 (H) 06/23/2022   TSH 12.500 (H) 04/07/2022   TSH 3.590 02/15/2022   TSH 0.056 (L) 12/14/2021   TSH 0.237 (L) 11/28/2021   TSH 4.220 11/09/2021   FREET4 1.34 03/14/2023   FREET4 1.23 01/16/2023   FREET4 1.30 11/08/2022   FREET4 1.28 08/08/2022   FREET4 0.94 06/23/2022   FREET4 0.62 (L) 04/07/2022   FREET4 1.00 02/15/2022   FREET4 1.84 (H) 12/14/2021   FREET4 1.24 (H) 11/28/2021   FREET4 1.08 02/28/2021    Pt denies feeling nodules in neck, hoarseness, dysphagia/odynophagia, SOB with lying down.  she denies family history of thyroid disorders.  No family history of thyroid cancer.  No history of radiation therapy to head or neck.  No recent use of iodine supplements.  Denies use of Biotin containing supplements.  I reviewed her chart and she also has a history of neuropathy, vitamin d deficiency, cardiomyopathy, pernicious anemia, bariatric surgery.   ROS:  Constitutional: + weight gain, + fatigue, no subjective hyperthermia Eyes: no blurry vision, no xerophthalmia ENT: no sore throat, no nodules palpated in throat, no dysphagia/odynophagia, no hoarseness Cardiovascular: no chest pain, no SOB, no palpitations, no leg swelling Respiratory: no cough, no SOB Gastrointestinal: no nausea/vomiting/diarrhea Musculoskeletal: no muscle/joint aches Skin: no rashes Neurological: + full body tremors-improving under neurology care, no numbness, no tingling, no  dizziness Psychiatric: no depression, no anxiety   Objective:   Objective     BP 108/64 (BP Location: Left Arm, Patient Position: Sitting, Cuff Size: Large)   Pulse (!) 54   Ht 5\' 7"  (1.702 m)   Wt 181 lb 9.6 oz (82.4 kg)   BMI 28.44 kg/m  Wt Readings from Last 3 Encounters:  05/03/23 181 lb 9.6 oz (82.4 kg)  03/28/23 165 lb (74.8 kg)  02/12/23 169 lb (76.7 kg)    BP Readings from Last 3 Encounters:  05/03/23 108/64  04/05/23 134/75  03/28/23 129/76      Physical Exam- Limited  Constitutional:  Body mass index is 28.44 kg/m. , not in acute distress, normal state of mind Eyes:  EOMI, no exophthalmos Musculoskeletal: no gross deformities, strength intact in all four extremities, no gross restriction of joint movements Skin:  no rashes, no hyperemia Neurological: no tremor with outstretched hands   CMP ( most recent) CMP     Component Value Date/Time   NA 142 01/16/2023 1324   K 4.3 01/16/2023 1324   CL 105 01/16/2023 1324   CO2 26 01/16/2023 1324   GLUCOSE 79 01/16/2023 1324   GLUCOSE 156 (H) 06/10/2021 0615   BUN 19 01/16/2023 1324   CREATININE 0.65 01/16/2023 1324   CALCIUM 9.2 01/16/2023 1324   PROT 6.8 01/22/2023 1143   PROT 6.5 01/16/2023 1324   ALBUMIN 4.2 01/22/2023 1143   ALBUMIN 4.2 01/16/2023 1324   AST 19 01/22/2023 1143   ALT 16 01/22/2023 1143   ALKPHOS 74 01/22/2023 1143   BILITOT 0.7 01/22/2023 1143   BILITOT 0.3 01/16/2023 1324   GFRNONAA >60 06/10/2021 0615     Diabetic Labs (most recent): No results found for: "HGBA1C", "MICROALBUR"   Lipid Panel ( most recent) Lipid Panel  Component Value Date/Time   CHOL 177 05/29/2022 1352   TRIG 134 05/29/2022 1352   HDL 80 05/29/2022 1352   LDLCALC 74 05/29/2022 1352   LABVLDL 23 05/29/2022 1352       Lab Results  Component Value Date   TSH 1.300 03/14/2023   TSH 0.823 01/16/2023   TSH 1.790 11/08/2022   TSH 3.770 08/08/2022   TSH 14.500 (H) 06/23/2022   TSH 12.500 (H)  04/07/2022   TSH 3.590 02/15/2022   TSH 0.056 (L) 12/14/2021   TSH 0.237 (L) 11/28/2021   TSH 4.220 11/09/2021   FREET4 1.34 03/14/2023   FREET4 1.23 01/16/2023   FREET4 1.30 11/08/2022   FREET4 1.28 08/08/2022   FREET4 0.94 06/23/2022   FREET4 0.62 (L) 04/07/2022   FREET4 1.00 02/15/2022   FREET4 1.84 (H) 12/14/2021   FREET4 1.24 (H) 11/28/2021   FREET4 1.08 02/28/2021     Thyroid US from 02/23/22 CLINICAL DATA:  Other.  Hashimoto's thyroiditis   EXAM: THYROID ULTRASOUND   TECHNIQUE: Ultrasound examination of the thyroid gland and adjacent soft tissues was performed.   COMPARISON:  None Available.   FINDINGS: Parenchymal Echotexture: Markedly heterogenous   Isthmus: 0.2 cm   Right lobe: 5.4 x 3.1 x 2.6 cm   Left lobe: 5.8 x 3.2 x 2.4 cm   _________________________________________________________   Estimated total number of nodules >/= 1 cm: 0   Number of spongiform nodules >/=  2 cm not described below (TR1): 0   Number of mixed cystic and solid nodules >/= 1.5 cm not described below (TR2): 0   _________________________________________________________   No discrete nodules are seen within the thyroid gland.   IMPRESSION: 1. Diffusely enlarged and heterogeneous thyroid gland. The imaging appearance is consistent with the clinical history of Hashimoto's thyroiditis. 2. No discrete thyroid nodules are visualized.     Electronically Signed   By: Malachy Moan M.D.   On: 02/24/2022 16:12   Latest Reference Range & Units 04/07/22 11:58 06/23/22 09:29 08/08/22 09:06 11/08/22 10:20 01/16/23 13:24 03/14/23 10:30  TSH 0.450 - 4.500 uIU/mL 12.500 (H) 14.500 (H) 3.770 1.790 0.823 1.300  Triiodothyronine,Free,Serum 2.0 - 4.4 pg/mL 1.5 (L)     2.5  T4,Free(Direct) 0.82 - 1.77 ng/dL 0.98 (L) 1.19 1.47 8.29 1.23 1.34  Thyroperoxidase Ab SerPl-aCnc 0 - 34 IU/mL 162 (H)       Thyroglobulin Antibody 0.0 - 0.9 IU/mL 3.9 (H)       (H): Data is abnormally high (L):  Data is abnormally low  Assessment & Plan:   ASSESSMENT / PLAN:  1. Hypothyroidism-r/t Hashimotos thyroiditis.   Patient with long-standing hypothyroidism, on levothyroxine therapy. On physical exam , patient  does not  have  gross goiter, thyroid nodules, or neck compression symptoms.  Positive antibody testing confirms suspicion of autoimmune thyroid disease.  Her previsit thyroid function tests are consistent with appropriate hormone replacement.  She is advised to continue her dose of Levothyroxine 88 mcg po daily before breakfast.  - We discussed about correct intake of levothyroxine, at fasting, with water, separated by at least 30 minutes from breakfast, and separated by more than 4 hours from calcium, iron, multivitamins, acid reflux medications (PPIs). -Patient is made aware of the fact that thyroid hormone replacement is needed for life, dose to be adjusted by periodic monitoring of thyroid function tests.  -She recently had thyroid US on 02/23/22 which showed enlarged thyroid with heterogeneous tissue and no nodularity.     I spent  24  minutes in the care of the patient today including review of labs from Thyroid Function, CMP, and other relevant labs ; imaging/biopsy records (current and previous including abstractions from other facilities); face-to-face time discussing  her lab results and symptoms, medications doses, her options of short and long term treatment based on the latest standards of care / guidelines;   and documenting the encounter.  [name]  participated in the discussions, expressed understanding, and voiced agreement with the above plans.  All questions were answered to her satisfaction. she is encouraged to contact clinic should she have any questions or concerns prior to her return visit.   FOLLOW UP PLAN:  Return in about 6 months (around 10/31/2023) for Thyroid follow up, Previsit labs.  Ronny Bacon, [facility] [address] [place], [place] [address] Phone: [phone] Fax: [phone]  05/03/2023, 3:03 PM

## 2023-05-03 NOTE — Patient Instructions (Signed)

## 2023-05-03 NOTE — Telephone Encounter (Signed)
Requested medication (s) are due for refill today: yes  Requested medication (s) are on the active medication list: yes  Last refill:  11/25/21  Future visit scheduled: no  Notes to clinic:  Unable to refill per protocol, last refill by historical  provider.      Requested Prescriptions  Pending Prescriptions Disp Refills   citalopram (CELEXA) 20 MG tablet [Pharmacy Med Name: CITALOPRAM HBR 20 MG TABLET] 90 tablet 4    Sig: TAKE 1 TABLET BY MOUTH EVERY DAY     Psychiatry:  Antidepressants - SSRI Failed - 05/02/2023  9:56 AM      Failed - Valid encounter within last 6 months    Recent Outpatient Visits           8 months ago Moderate protein-calorie malnutrition (HCC)   [place] [facility] Coleta, Naturita T, NP   10 months ago Acute bilateral low back pain without sciatica   [place] [facility] Mecum, Erin E, PA-C   11 months ago Moderate protein-calorie malnutrition (HCC)   [place] [facility] [place], Corrie Dandy T, NP   1 year ago Moderate protein-calorie malnutrition (HCC)   [place] [facility] [place], Corrie Dandy T, NP   1 year ago Acute cough   [place] [facility] Marjie Skiff, NP       Future Appointments             In 4 months Deirdre Evener, MD [facility]

## 2023-05-27 NOTE — Patient Instructions (Signed)
Be Involved in Caring For Your Health:  Taking Medications When medications are taken as directed, they can greatly improve your health. But if they are not taken as prescribed, they may not work. In some cases, not taking them correctly can be harmful. To help ensure your treatment remains effective and safe, understand your medications and how to take them. Bring your medications to each visit for review by your provider.  Your lab results, notes, and after visit summary will be available on My Chart. We strongly encourage you to use this feature. If lab results are abnormal the clinic will contact you with the appropriate steps. If the clinic does not contact you assume the results are satisfactory. You can always view your results on My Chart. If you have questions regarding your health or results, please contact the clinic during office hours. You can also ask questions on My Chart.  We at [facility] are grateful that you chose [place] to provide your care. We strive to provide evidence-based and compassionate care and are always looking for feedback. If you get a survey from the clinic please complete this so we can hear your opinions.  Hypothyroidism  Hypothyroidism is when the thyroid gland does not make enough of certain hormones. This is called an underactive thyroid. The thyroid gland is a small gland located in the lower front part of the neck, just in front of the windpipe (trachea). This gland makes hormones that help control how the body uses food for energy (metabolism) as well as how the heart and brain function. These hormones also play a role in keeping your bones strong. When the thyroid is underactive, it produces too little of the hormones thyroxine (T4) and triiodothyronine (T3). What are the causes? This condition may be caused by: Hashimoto's disease. This is a disease in which the body's disease-fighting system (immune system) attacks the thyroid gland. This is the  most common cause. Viral infections. Pregnancy. Certain medicines. Birth defects. Problems with a gland in the center of the brain (pituitary gland). Lack of enough iodine in the diet. Other causes may include: Past radiation treatments to the head or neck for cancer. Past treatment with radioactive iodine. Past exposure to radiation in the environment. Past surgical removal of part or all of the thyroid. What increases the risk? You are more likely to develop this condition if: You are female. You have a family history of thyroid conditions. You use a medicine called lithium. You take medicines that affect the immune system (immunosuppressants). What are the signs or symptoms? Common symptoms of this condition include: Not being able to tolerate cold. Feeling as though you have no energy (lethargy). Lack of appetite. Constipation. Sadness or depression. Weight gain that is not explained by a change in diet or exercise habits. Menstrual irregularity. Dry skin, coarse hair, or brittle nails. Other symptoms may include: Muscle pain. Slowing of thought processes. Poor memory. How is this diagnosed? This condition may be diagnosed based on: Your symptoms, your medical history, and a physical exam. Blood tests. You may also have imaging tests, such as an ultrasound or MRI. How is this treated? This condition is treated with medicine that replaces the thyroid hormones that your body does not make. After you begin treatment, it may take several weeks for symptoms to go away. Follow these instructions at home: Take over-the-counter and prescription medicines only as told by your health care provider. If you start taking any new medicines, tell your health care  provider. Keep all follow-up visits as told by your health care provider. This is important. As your condition improves, your dosage of thyroid hormone medicine may change. You will need to have blood tests regularly so that  your health care provider can monitor your condition. Contact a health care provider if: Your symptoms do not get better with treatment. You are taking thyroid hormone replacement medicine and you: Sweat a lot. Have tremors. Feel anxious. Lose weight rapidly. Cannot tolerate heat. Have emotional swings. Have diarrhea. Feel weak. Get help right away if: You have chest pain. You have an irregular heartbeat. You have a rapid heartbeat. You have difficulty breathing. These symptoms may be an emergency. Get help right away. Call 911. Do not wait to see if the symptoms will go away. Do not drive yourself to the hospital. Summary Hypothyroidism is when the thyroid gland does not make enough of certain hormones (it is underactive). When the thyroid is underactive, it produces too little of the hormones thyroxine (T4) and triiodothyronine (T3). The most common cause is Hashimoto's disease, a disease in which the body's disease-fighting system (immune system) attacks the thyroid gland. The condition can also be caused by viral infections, medicine, pregnancy, or past radiation treatment to the head or neck. Symptoms may include weight gain, dry skin, constipation, feeling as though you do not have energy, and not being able to tolerate cold. This condition is treated with medicine to replace the thyroid hormones that your body does not make. This information is not intended to replace advice given to you by your health care provider. Make sure you discuss any questions you have with your health care provider. Document Revised: 08/02/2021 Document Reviewed: 08/02/2021 Elsevier Patient Education  2024 [employer].

## 2023-05-28 ENCOUNTER — Other Ambulatory Visit: Payer: Self-pay | Admitting: Nurse Practitioner

## 2023-05-28 DIAGNOSIS — Z1231 Encounter for screening mammogram for malignant neoplasm of breast: Secondary | ICD-10-CM

## 2023-05-29 ENCOUNTER — Encounter: Payer: Self-pay | Admitting: Nurse Practitioner

## 2023-05-29 ENCOUNTER — Ambulatory Visit (INDEPENDENT_AMBULATORY_CARE_PROVIDER_SITE_OTHER): Payer: Medicare HMO | Admitting: Nurse Practitioner

## 2023-05-29 VITALS — BP 136/75 | HR 51 | Temp 98.0°F | Ht 67.0 in | Wt 180.6 lb

## 2023-05-29 DIAGNOSIS — M85851 Other specified disorders of bone density and structure, right thigh: Secondary | ICD-10-CM

## 2023-05-29 DIAGNOSIS — I5181 Takotsubo syndrome: Secondary | ICD-10-CM

## 2023-05-29 DIAGNOSIS — Z0000 Encounter for general adult medical examination without abnormal findings: Secondary | ICD-10-CM | POA: Diagnosis not present

## 2023-05-29 DIAGNOSIS — D510 Vitamin B12 deficiency anemia due to intrinsic factor deficiency: Secondary | ICD-10-CM

## 2023-05-29 DIAGNOSIS — G629 Polyneuropathy, unspecified: Secondary | ICD-10-CM | POA: Diagnosis not present

## 2023-05-29 DIAGNOSIS — E063 Autoimmune thyroiditis: Secondary | ICD-10-CM

## 2023-05-29 DIAGNOSIS — R7989 Other specified abnormal findings of blood chemistry: Secondary | ICD-10-CM | POA: Diagnosis not present

## 2023-05-29 DIAGNOSIS — F419 Anxiety disorder, unspecified: Secondary | ICD-10-CM

## 2023-05-29 DIAGNOSIS — R001 Bradycardia, unspecified: Secondary | ICD-10-CM

## 2023-05-29 DIAGNOSIS — E440 Moderate protein-calorie malnutrition: Secondary | ICD-10-CM | POA: Diagnosis not present

## 2023-05-29 DIAGNOSIS — E559 Vitamin D deficiency, unspecified: Secondary | ICD-10-CM

## 2023-05-29 DIAGNOSIS — Z23 Encounter for immunization: Secondary | ICD-10-CM

## 2023-05-29 DIAGNOSIS — D508 Other iron deficiency anemias: Secondary | ICD-10-CM | POA: Diagnosis not present

## 2023-05-29 LAB — URINALYSIS, ROUTINE W REFLEX MICROSCOPIC
Bilirubin, UA: NEGATIVE
Glucose, UA: NEGATIVE
Ketones, UA: NEGATIVE
Leukocytes,UA: NEGATIVE
Nitrite, UA: NEGATIVE
Protein,UA: NEGATIVE
Specific Gravity, UA: 1.015 (ref 1.005–1.030)
Urobilinogen, Ur: 0.2 mg/dL (ref 0.2–1.0)
pH, UA: 5.5 (ref 5.0–7.5)

## 2023-05-29 LAB — MICROSCOPIC EXAMINATION: Bacteria, UA: NONE SEEN

## 2023-05-29 MED ORDER — GABAPENTIN 300 MG PO CAPS: 300.0000 mg | ORAL_CAPSULE | Freq: Three times a day (TID) | ORAL | 2 refills | Status: DC

## 2023-05-29 NOTE — Assessment & Plan Note (Signed)
Chronic, ongoing.  Continue current supplement and collaboration with hematology as needed.  Recheck labs.

## 2023-05-29 NOTE — Assessment & Plan Note (Signed)
Chronic, improving with PTSD aspect present from lengthy hospitalization with septic shock.  Denies SI/HI.  Continue Celexa 20 MG daily and Buspar 15 MG TID + Trazodone.  Return to therapy as needed.

## 2023-05-29 NOTE — Progress Notes (Signed)
Contacted via MyChart   Overall urine sample looks good, just a trace of blood which we will monitor.  This can be from many things, even some irritation down below:)

## 2023-05-29 NOTE — Assessment & Plan Note (Signed)
This is trending down, was >2000 at one point after septic shock.  Recheck today and return to hematology as needed.

## 2023-05-29 NOTE — Assessment & Plan Note (Signed)
Noted on DEXA April 2021, ordered today and will schedule. Continue weekly Vitamin D supplement + adequate calcium intake.  May reduce to daily dosing if stable levels.

## 2023-05-29 NOTE — Assessment & Plan Note (Signed)
Present since bariatric surgery and followed by hematology in past.  Continue supplements at home and collaboration with hematology as needed.  Recheck labs today.

## 2023-05-29 NOTE — Assessment & Plan Note (Signed)
Post hospitalization with septic shock and is ongoing issue.  Will continue Gabapentin 300 MG, but recommend cut back to BID and then trial cut back to just at night in one to two weeks due to side effects with this.  Start Nervive to see if benefit to discomfort.  Referral to neurology for further testing to determine level of neuropathy present and obtain recommendations. Recheck labs today.

## 2023-05-29 NOTE — Assessment & Plan Note (Signed)
Chronic, stable.  Continue current medication regimen and adjust as needed.  Thyroid ultrasound completed and overall reassuring.  Continue collaboration with endo.

## 2023-05-29 NOTE — Assessment & Plan Note (Signed)
Followed by cardiology in past, will have her return as needed.  HR apical ranges 50 to 60 and overall no symptoms presenting.

## 2023-05-29 NOTE — Assessment & Plan Note (Signed)
Resolved in hospital in past.  Followed by cardiology locally in past, will have her return in future as needed.

## 2023-05-29 NOTE — Progress Notes (Signed)
BP 136/75   Pulse (!) 51   Temp 98 F (36.7 C) (Oral)   Ht 5\' 7"  (1.702 m)   Wt 180 lb 9.6 oz (81.9 kg)   SpO2 99%   BMI 28.29 kg/m    Subjective:    Patient ID: [name] Robinson, female    DOB: Aug 26, 1950, 72 y.o.   MRN: [id number]  HPI: [name] Robinson is a 72 y.o. female presenting on 05/29/2023 for Medicare Wellness and annual exam. Current medical complaints include:none  She currently lives with:husband Menopausal Symptoms: no  NEUROPATHY Started after hospitalization with sepsis almost 2 years ago. Taking Gabapentin TID.  She reports her feet when lying down will hurt and toes are curling up, has to constantly move them.  Currently discomfort is more heaviness in calves and feet, less burning then in past.  Reports no pain more heaviness and restless. Neuropathy status: uncontrolled  Satisfied with current treatment?: yes Medication side effects: occasional speech issues and brain fog Medication compliance:  good compliance Location: both feet and calves Frequency: constant Bilateral: yes Symmetric: yes Numbness: no Decreased sensation: no Weakness: yes when gets up from chair, has to get stance before going -- feels off balance with feet Context: fluctuating Alleviating factors: Gabapentin at times Aggravating factors: lying down Treatments attempted: Gabapentin   HYPOTHYROIDISM Follows with endo and last saw on 05/03/23.  Continues on Levothyroxine 88 MCG. Thyroid control status:stable Satisfied with current treatment? yes Medication side effects: no Medication compliance: good compliance Etiology of hypothyroidism: Hashimoto's Recent dose adjustment:no Fatigue: occasional Cold intolerance: no Heat intolerance: no Weight gain: no Weight loss: no Constipation: no Diarrhea/loose stools: no Palpitations: no Lower extremity edema: no Anxiety/depressed mood: no   DEPRESSION Currently taking Celexa, Buspar, and Trazodone.  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 Mood status: stable Satisfied with current treatment?: yes Symptom severity: moderate  Duration of current treatment : chronic Side effects: no Medication compliance: good compliance Psychotherapy/counseling: yes in the past -- no longer Depressed mood: occasional Anxious mood: occasional Anhedonia: no Significant weight loss or gain: no Insomnia: no Fatigue: occasional Feelings of worthlessness or guilt: no Impaired concentration/indecisiveness: no Suicidal ideations: no Hopelessness: no Crying spells: no    05/29/2023    3:10 PM 08/29/2022    1:45 PM 05/29/2022    1:29 PM 05/03/2022   10:11 AM 04/03/2022    2:18 PM  Depression screen PHQ 2/9  Decreased Interest 1 0 1 0 1  Down, Depressed, Hopeless 1 1 1  0 1  PHQ - 2 Score 2 1 2  0 2  Altered sleeping 0 0 1 0 0  Tired, decreased energy 2 1 2  0 1  Change in appetite 0 0 0 0 2  Feeling bad or failure about yourself  1 0 0 1 0  Trouble concentrating 0 0 0 0 1  Moving slowly or fidgety/restless 2 0 3 1 1   Suicidal thoughts 0 0 0 0 0  PHQ-9 Score 7 2 8 2 7   Difficult doing work/chores Not difficult at all  Somewhat difficult Not difficult at all Somewhat difficult      05/29/2023    3:11 PM 08/29/2022    1:45 PM 05/29/2022    1:29 PM 04/03/2022    2:19 PM  GAD 7 : Generalized Anxiety Score  Nervous, Anxious, on Edge 1 0 1 1  Control/stop worrying 2 0 1 1  Worry too much - different things 2 1 1 1   Trouble relaxing 1 0  1  Restless 0 0 0 0  Easily annoyed or irritable 1 1 1 2   Afraid - awful might happen 1 1 1 2   Total GAD 7 Score 8 3  8   Anxiety Difficulty Not difficult at all  Somewhat difficult Somewhat difficult      04/03/2022    2:18 PM 05/03/2022   10:08 AM 05/29/2022    1:28 PM 08/29/2022    1:45 PM 05/29/2023    3:10 PM  Fall Risk  Falls in the past year? 0 0 0 0 0  Was there an injury with Fall? 0 0 0 0 0  Fall Risk Category Calculator 0 0 0 0 0  Fall Risk Category (Retired) Low  Low Low    (RETIRED) Patient Fall Risk Level Low fall risk Moderate fall risk Low fall risk    Patient at Risk for Falls Due to No Fall Risks Impaired balance/gait No Fall Risks No Fall Risks No Fall Risks  Fall risk Follow up Falls evaluation completed Falls evaluation completed;Education provided;Falls prevention discussed Falls evaluation completed Falls evaluation completed Falls evaluation completed      Past Medical History:  Past Medical History:  Diagnosis Date   Hypothyroidism    Morbid obesity (HCC) 11/2019   s/p Barriatric Sgx ([facility])   Sleep apnea    doesn't use CPAP, lost weight, not needed   Surgical History:  Past Surgical History:  Procedure Laterality Date   ABDOMINAL HYSTERECTOMY     BARIATRIC SURGERY N/A 01/02/2020   duodenal switch   CERVICAL DISC SURGERY N/A    discectomy   COLONOSCOPY     ESOPHAGOGASTRODUODENOSCOPY  11/06/2020   EYE SURGERY Bilateral    laser procedure   IR RADIOLOGIST EVAL & MGMT  04/05/2023   LAPAROSCOPIC COLON RESECTION     MASS EXCISION Left 04/13/2021   Procedure: EXCISION MASS, left lateral thigh;  Surgeon: Campbell Lerner, MD;  Location: [facility] ORS;  Service: General;  Laterality: Left;   SHOULDER SURGERY Left    torn cartilege   TONSILLECTOMY N/A    TOTAL HIP ARTHROPLASTY Right 11/02/2020   TRANSTHORACIC ECHOCARDIOGRAM  07/14/2019   a) [facility]) Technically difficult.-Used Definity.  Nl LV Fxn -EF 55 - 60%.  No obvious WMA.  Mild MR.  No effusion;; b) Echo 04/16/2021-[facility]: (Fair quality) normal LV size and function.  EF 55-60%.  No R WMA.  Normal diastolic parameters.  Mild MR.  Unable to assess RVSP.    Medications:  Current Outpatient Medications on File Prior to Visit  Medication Sig   busPIRone (BUSPAR) 15 MG tablet Take 1 tablet (15 mg total) by mouth 2 (two) times daily.   citalopram (CELEXA) 20 MG tablet TAKE 1 TABLET BY MOUTH EVERY DAY   ergocalciferol (VITAMIN D2) 1.25 MG (50000 UT) capsule Take 1 capsule  (50,000 Units total) by mouth once a week.   levothyroxine (SYNTHROID) 88 MCG tablet Take 1 tablet (88 mcg total) by mouth daily before breakfast.   traZODone (DESYREL) 50 MG tablet Take 2 tablets (100 mg total) by mouth at bedtime as needed.   vitamin B-12 (CYANOCOBALAMIN) 1000 MCG tablet Take 1,000 mcg by mouth once a week.   No current facility-administered medications on file prior to visit.    Allergies:  Allergies  Allergen Reactions   Solifenacin Rash    Patient had bad rash with this med.    Social History:  Social History   Socioeconomic History   Marital status: Married    Spouse  name: Maurine Minister   Number of children: 2   Years of education: Not on file   Highest education level: Some college, no degree  Occupational History   Not on file  Tobacco Use   Smoking status: Never   Smokeless tobacco: Never  Vaping Use   Vaping status: Never Used  Substance and Sexual Activity   Alcohol use: Never   Drug use: Never   Sexual activity: Not Currently  Other Topics Concern   Not on file  Social History Narrative   04/20/22 She is retired, lives with husband.    Otherwise, she is usually on the go, not sedentary.   Social Determinants of Health   Financial Resource Strain: Medium Risk (05/29/2023)   Overall Financial Resource Strain (CARDIA)    Difficulty of Paying Living Expenses: Somewhat hard  Food Insecurity: No Food Insecurity (05/29/2023)   Hunger Vital Sign    Worried About Running Out of Food in the Last Year: Never true    Ran Out of Food in the Last Year: Never true  Transportation Needs: No Transportation Needs (05/29/2023)   PRAPARE - Administrator, Civil Service (Medical): No    Lack of Transportation (Non-Medical): No  Physical Activity: Insufficiently Active (05/29/2023)   Exercise Vital Sign    Days of Exercise per Week: 3 days    Minutes of Exercise per Session: 30 min  Stress: Stress Concern Present (05/29/2023)   [employer] of  Occupational Health - Occupational Stress Questionnaire    Feeling of Stress : To some extent  Social Connections: Moderately Integrated (05/29/2023)   Social Connection and Isolation Panel [NHANES]    Frequency of Communication with Friends and Family: More than three times a week    Frequency of Social Gatherings with Friends and Family: More than three times a week    Attends Religious Services: More than 4 times per year    Active Member of [employer] or Organizations: No    Attends Banker Meetings: Never    Marital Status: Married  Catering manager Violence: Not At Risk (05/29/2023)   Humiliation, Afraid, Rape, and Kick questionnaire    Fear of Current or Ex-Partner: No    Emotionally Abused: No    Physically Abused: No    Sexually Abused: No   Social History   Tobacco Use  Smoking Status Never  Smokeless Tobacco Never   Social History   Substance and Sexual Activity  Alcohol Use Never    Family History:  Family History  Problem Relation Age of Onset   Thyroid disease Mother    Cancer - Lung Father    Cancer - Lung Brother    Cancer Brother    Past medical history, surgical history, medications, allergies, family history and social history reviewed with patient today and changes made to appropriate areas of the chart.   ROS All other ROS negative except what is listed above and in the HPI.      Objective:    BP 136/75   Pulse (!) 51   Temp 98 F (36.7 C) (Oral)   Ht 5\' 7"  (1.702 m)   Wt 180 lb 9.6 oz (81.9 kg)   SpO2 99%   BMI 28.29 kg/m   Wt Readings from Last 3 Encounters:  05/29/23 180 lb 9.6 oz (81.9 kg)  05/03/23 181 lb 9.6 oz (82.4 kg)  03/28/23 165 lb (74.8 kg)    Physical Exam Vitals and nursing note reviewed. Exam conducted with a  chaperone present.  Constitutional:      General: She is awake. She is not in acute distress.    Appearance: She is well-developed and well-groomed. She is not ill-appearing or toxic-appearing.  HENT:      Head: Normocephalic and atraumatic.     Right Ear: Hearing, tympanic membrane, ear canal and external ear normal. No drainage.     Left Ear: Hearing, tympanic membrane, ear canal and external ear normal. No drainage.     Nose: Nose normal.     Right Sinus: No maxillary sinus tenderness or frontal sinus tenderness.     Left Sinus: No maxillary sinus tenderness or frontal sinus tenderness.     Mouth/Throat:     Mouth: Mucous membranes are moist.     Pharynx: Oropharynx is clear. Uvula midline. No pharyngeal swelling, oropharyngeal exudate or posterior oropharyngeal erythema.  Eyes:     General: Lids are normal.        Right eye: No discharge.        Left eye: No discharge.     Extraocular Movements: Extraocular movements intact.     Conjunctiva/sclera: Conjunctivae normal.     Pupils: Pupils are equal, round, and reactive to light.     Visual Fields: Right eye visual fields normal and left eye visual fields normal.  Neck:     Thyroid: No thyromegaly.     Vascular: No carotid bruit.     Trachea: Trachea normal.  Cardiovascular:     Rate and Rhythm: Regular rhythm. Bradycardia present.     Pulses:          Dorsalis pedis pulses are 2+ on the right side and 2+ on the left side.       Posterior tibial pulses are 2+ on the right side and 2+ on the left side.     Heart sounds: Normal heart sounds. No murmur heard.    No gallop.  Pulmonary:     Effort: Pulmonary effort is normal. No accessory muscle usage or respiratory distress.     Breath sounds: Normal breath sounds.  Chest:  Breasts:    Right: Normal.     Left: Normal.  Abdominal:     General: Bowel sounds are normal.     Palpations: Abdomen is soft. There is no hepatomegaly or splenomegaly.     Tenderness: There is no abdominal tenderness.  Musculoskeletal:        General: Normal range of motion.     Cervical back: Normal range of motion and neck supple.     Right lower leg: No edema.     Left lower leg: No edema.     Right  foot: Normal range of motion.     Left foot: Normal range of motion.  Feet:     Right foot:     Protective Sensation: 10 sites tested.  5 sites sensed.     Skin integrity: Skin integrity normal.     Toenail Condition: Right toenails are normal.     Left foot:     Protective Sensation: 10 sites tested.  7 sites sensed.     Skin integrity: Skin integrity normal.     Toenail Condition: Left toenails are normal.  Lymphadenopathy:     Head:     Right side of head: No submental, submandibular, tonsillar, preauricular or posterior auricular adenopathy.     Left side of head: No submental, submandibular, tonsillar, preauricular or posterior auricular adenopathy.     Cervical: No cervical adenopathy.  Upper Body:     Right upper body: No supraclavicular, axillary or pectoral adenopathy.     Left upper body: No supraclavicular, axillary or pectoral adenopathy.  Skin:    General: Skin is warm and dry.     Capillary Refill: Capillary refill takes less than 2 seconds.     Findings: No rash.  Neurological:     Mental Status: She is alert and oriented to person, place, and time.     Gait: Gait is intact.     Deep Tendon Reflexes: Reflexes are normal and symmetric.     Reflex Scores:      Brachioradialis reflexes are 2+ on the right side and 2+ on the left side.      Patellar reflexes are 2+ on the right side and 2+ on the left side. Psychiatric:        Attention and Perception: Attention normal.        Mood and Affect: Mood normal.        Speech: Speech normal.        Behavior: Behavior normal. Behavior is cooperative.        Thought Content: Thought content normal.        Judgment: Judgment normal.       05/29/2023    8:03 PM 05/03/2022   10:06 AM 04/28/2021    4:37 PM  6CIT Screen  What Year? 0 points 0 points 0 points  What month? 0 points 0 points 0 points  What time? 0 points 0 points 0 points  Count back from 20 0 points 0 points 0 points  Months in reverse 0 points 0 points  4 points  Repeat phrase 0 points 0 points 0 points  Total Score 0 points 0 points 4 points   Results for orders placed or performed in visit on 05/29/23  Microscopic Examination   Urine  Result Value Ref Range   WBC, UA 0-5 0 - 5 /hpf   RBC, Urine 0-2 0 - 2 /hpf   Epithelial Cells (non renal) 0-10 0 - 10 /hpf   Bacteria, UA None seen None seen/Few  Urinalysis, Routine w reflex microscopic  Result Value Ref Range   Specific Gravity, UA 1.015 1.005 - 1.030   pH, UA 5.5 5.0 - 7.5   Color, UA Yellow Yellow   Appearance Ur Clear Clear   Leukocytes,UA Negative Negative   Protein,UA Negative Negative/Trace   Glucose, UA Negative Negative   Ketones, UA Negative Negative   RBC, UA Trace (A) Negative   Bilirubin, UA Negative Negative   Urobilinogen, Ur 0.2 0.2 - 1.0 mg/dL   Nitrite, UA Negative Negative   Microscopic Examination See below:       Assessment & Plan:   Problem List Items Addressed This Visit       Cardiovascular and Mediastinum   Bradycardia, sinus    Followed by cardiology in past, will have her return as needed.  HR apical ranges 50 to 60 and overall no symptoms presenting.      Takotsubo cardiomyopathy    Resolved in hospital in past.  Followed by cardiology locally in past, will have her return in future as needed.      Relevant Orders   Comprehensive metabolic panel   Lipid Panel w/o Chol/HDL Ratio     Endocrine   Hashimoto's thyroiditis    Chronic, stable.  Continue current medication regimen and adjust as needed.  Thyroid ultrasound completed and overall reassuring.  Continue collaboration with endo.  Relevant Orders   Urinalysis, Routine w reflex microscopic (Completed)   Microscopic Examination (Completed)     Nervous and Auditory   Neuropathy    Post hospitalization with septic shock and is ongoing issue.  Will continue Gabapentin 300 MG, but recommend cut back to BID and then trial cut back to just at night in one to two weeks due to side  effects with this.  Start Nervive to see if benefit to discomfort.  Referral to neurology for further testing to determine level of neuropathy present and obtain recommendations. Recheck labs today.       Relevant Orders   Magnesium   Ambulatory referral to Neurology     Musculoskeletal and Integument   Osteopenia of femoral neck    Noted on DEXA April 2021, ordered today and will schedule. Continue weekly Vitamin D supplement + adequate calcium intake.  May reduce to daily dosing if stable levels.      Relevant Orders   VITAMIN D 25 Hydroxy (Vit-D Deficiency, Fractures)     Other   Elevated ferritin (Chronic)    This is trending down, was >2000 at one point after septic shock.  Recheck today and return to hematology as needed.      Relevant Orders   CBC with Differential/Platelet   Ferritin   Anxiety    Chronic, improving with PTSD aspect present from lengthy hospitalization with septic shock.  Denies SI/HI.  Continue Celexa 20 MG daily and Buspar 15 MG TID + Trazodone.  Return to therapy as needed.      Iron deficiency anemia    Present since bariatric surgery and followed by hematology in past.  Continue supplements at home and collaboration with hematology as needed.  Recheck labs today.      Relevant Orders   CBC with Differential/Platelet   Iron Binding Cap (TIBC)([facility])   Ferritin   Pernicious anemia    Chronic, ongoing.  Continue current supplement and collaboration with hematology as needed.  Recheck labs.      Relevant Orders   CBC with Differential/Platelet   Vitamin B12   Other Visit Diagnoses     Medicare annual wellness visit, subsequent    -  Primary   Medicare wellness due and performed today.   Flu vaccine need       Flu vaccine today, educated on this.   Relevant Orders   Flu Vaccine Trivalent High Dose (Fluad) (Completed)   Pneumococcal vaccination given       PCV20 today, educated on this.   Relevant Orders   Pneumococcal conjugate  vaccine 20-valent (Completed)   Encounter for annual physical exam       Annual physical today with labs and health maintenance reviewed, discussed with patient.        Follow up plan: Return in about 3 months (around 08/29/2023) for Neuropathy .   LABORATORY TESTING:  - Pap smear: not applicable  IMMUNIZATIONS:   - Tdap: Tetanus vaccination status reviewed: last tetanus booster within 10 years. - Influenza: Up to date - Pneumovax: Not applicable - Prevnar: Administered today - COVID: Up to date - HPV: Not applicable - Shingrix vaccine: Refused  SCREENING: -Mammogram: Up to date , scheduled - Colonoscopy: Refused  - Bone Density: Ordered today , will schedule -Hearing Test: Not applicable  -Spirometry: Not applicable   PATIENT COUNSELING:   Advised to take 1 mg of folate supplement per day if capable of pregnancy.   Sexuality: Discussed sexually transmitted diseases, partner selection, use  of condoms, avoidance of unintended pregnancy  and contraceptive alternatives.   Advised to avoid cigarette smoking.  I discussed with the patient that most people either abstain from alcohol or drink within safe limits (<=14/week and <=4 drinks/occasion for males, <=7/weeks and <= 3 drinks/occasion for females) and that the risk for alcohol disorders and other health effects rises proportionally with the number of drinks per week and how often a drinker exceeds daily limits.  Discussed cessation/primary prevention of drug use and availability of treatment for abuse.   Diet: Encouraged to adjust caloric intake to maintain  or achieve ideal body weight, to reduce intake of dietary saturated fat and total fat, to limit sodium intake by avoiding high sodium foods and not adding table salt, and to maintain adequate dietary potassium and calcium preferably from fresh fruits, vegetables, and low-fat dairy products.    Stressed the importance of regular exercise  Injury prevention: Discussed  safety belts, safety helmets, smoke detector, smoking near bedding or upholstery.   Dental health: Discussed importance of regular tooth brushing, flossing, and dental visits.    NEXT PREVENTATIVE PHYSICAL DUE IN 1 YEAR. Return in about 3 months (around 08/29/2023) for Neuropathy .

## 2023-05-30 LAB — COMPREHENSIVE METABOLIC PANEL
ALT: 13 [IU]/L (ref 0–32)
AST: 18 [IU]/L (ref 0–40)
Albumin: 4.5 g/dL (ref 3.9–4.9)
Alkaline Phosphatase: 109 [IU]/L (ref 44–121)
BUN/Creatinine Ratio: 24 (ref 12–28)
BUN: 17 mg/dL (ref 8–27)
Bilirubin Total: 0.7 mg/dL (ref 0.0–1.2)
CO2: 27 mmol/L (ref 20–29)
Calcium: 9.4 mg/dL (ref 8.7–10.3)
Chloride: 102 mmol/L (ref 96–106)
Creatinine, Ser: 0.7 mg/dL (ref 0.57–1.00)
Globulin, Total: 2.1 g/dL (ref 1.5–4.5)
Glucose: 79 mg/dL (ref 70–99)
Potassium: 3.9 mmol/L (ref 3.5–5.2)
Sodium: 141 mmol/L (ref 134–144)
Total Protein: 6.6 g/dL (ref 6.0–8.5)
eGFR: 93 mL/min/{1.73_m2} (ref >59)

## 2023-05-30 LAB — VITAMIN D 25 HYDROXY (VIT D DEFICIENCY, FRACTURES): Vit D, 25-Hydroxy: 48.5 ng/mL (ref 30.0–100.0)

## 2023-05-30 LAB — CBC WITH DIFFERENTIAL/PLATELET
Basophils Absolute: 0 10*3/uL (ref 0.0–0.2)
Basos: 1 %
EOS (ABSOLUTE): 0 10*3/uL (ref 0.0–0.4)
Eos: 0 %
Hematocrit: 39.2 % (ref 34.0–46.6)
Hemoglobin: 12.9 g/dL (ref 11.1–15.9)
Immature Grans (Abs): 0 10*3/uL (ref 0.0–0.1)
Immature Granulocytes: 0 %
Lymphocytes Absolute: 1.6 10*3/uL (ref 0.7–3.1)
Lymphs: 32 %
MCH: 29.9 pg (ref 26.6–33.0)
MCHC: 32.9 g/dL (ref 31.5–35.7)
MCV: 91 fL (ref 79–97)
Monocytes Absolute: 0.3 10*3/uL (ref 0.1–0.9)
Monocytes: 6 %
Neutrophils Absolute: 3.1 10*3/uL (ref 1.4–7.0)
Neutrophils: 61 %
Platelets: 176 10*3/uL (ref 150–450)
RBC: 4.32 x10E6/uL (ref 3.77–5.28)
RDW: 11.9 % (ref 11.7–15.4)
WBC: 5 10*3/uL (ref 3.4–10.8)

## 2023-05-30 LAB — IRON AND TIBC
Iron Saturation: 27 % (ref 15–55)
Iron: 74 ug/dL (ref 27–139)
Total Iron Binding Capacity: 270 ug/dL (ref 250–450)
UIBC: 196 ug/dL (ref 118–369)

## 2023-05-30 LAB — VITAMIN B12: Vitamin B-12: 986 pg/mL (ref 232–1245)

## 2023-05-30 LAB — MAGNESIUM: Magnesium: 2.1 mg/dL (ref 1.6–2.3)

## 2023-05-30 LAB — LIPID PANEL W/O CHOL/HDL RATIO
Cholesterol, Total: 184 mg/dL (ref 100–199)
HDL: 86 mg/dL (ref >39)
LDL Chol Calc (NIH): 79 mg/dL (ref 0–99)
Triglycerides: 109 mg/dL (ref 0–149)
VLDL Cholesterol Cal: 19 mg/dL (ref 5–40)

## 2023-05-30 LAB — FERRITIN: Ferritin: 876 ng/mL — ABNORMAL HIGH (ref 15–150)

## 2023-05-30 NOTE — Progress Notes (Signed)
Contacted via MyChart   Good morning [name] Robinson, your labs have returned and look fabulous with exception of Ferritin remaining elevated, trend up a little this check.  Everything else looks fantastic.  I would recommend a return visit to hematology, Dr. Cathie Hoops, to continue assessment of elevated ferritin.  Any questions? Keep being amazing!!  Thank you for allowing me to participate in your care.  I appreciate you. Kindest regards, Loryn Haacke

## 2023-05-30 NOTE — Addendum Note (Signed)
Addended by: Aura Dials T on: 05/30/2023 10:49 AM   Modules accepted: Level of Service

## 2023-06-12 ENCOUNTER — Ambulatory Visit: Payer: Medicare HMO | Admitting: Emergency Medicine

## 2023-06-12 VITALS — Ht 67.0 in | Wt 180.0 lb

## 2023-06-12 DIAGNOSIS — Z0000 Encounter for general adult medical examination without abnormal findings: Secondary | ICD-10-CM | POA: Diagnosis not present

## 2023-06-12 NOTE — Patient Instructions (Addendum)
[name] Robinson , Thank you for taking time to come for your Medicare Wellness Visit. I appreciate your ongoing commitment to your health goals. Please review the following plan we discussed and let me know if I can assist you in the future.   Referrals/Orders/Follow-Ups/Clinician Recommendations: Call [facility] to schedule your bone density scan @ [phone] at your earliest convenience.  This is a list of the screening recommended for you and due dates:  Health Maintenance  Topic Date Due   COVID-19 Vaccine (3 - 2023-24 season) 06/14/2023*   Zoster (Shingles) Vaccine (1 of 2) 08/29/2023*   Colon Cancer Screening  05/28/2024*   Medicare Annual Wellness Visit  06/11/2024   Mammogram  06/20/2024   DEXA scan (bone density measurement)  11/23/2024   DTaP/Tdap/Td vaccine (3 - Td or Tdap) 03/01/2031   Pneumonia Vaccine  Completed   Flu Shot  Completed   Hepatitis C Screening  Completed   HPV Vaccine  Aged Out  *Topic was postponed. The date shown is not the original due date.    Advanced directives: (Copy Requested) Please bring a copy of your health care power of attorney and living will to the office to be added to your chart at your convenience.  Next Medicare Annual Wellness Visit scheduled for next year: Yes, 06/17/24 @ 10:40am

## 2023-06-12 NOTE — Progress Notes (Signed)
Subjective:   [name] Robinson is a 72 y.o. female who presents for Medicare Annual (Subsequent) preventive examination.  Visit Complete: Virtual I connected with  [name] Robinson on 06/12/23 by a audio enabled telemedicine application and verified that I am speaking with the correct person using two identifiers.  Patient Location: Home  Provider Location: Office/Clinic  I discussed the limitations of evaluation and management by telemedicine. The patient expressed understanding and agreed to proceed.  Vital Signs: Because this visit was a virtual/telehealth visit, some criteria may be missing or patient reported. Any vitals not documented were not able to be obtained and vitals that have been documented are patient reported.   Cardiac Risk Factors include: advanced age (>65men, >87 women);Other (see comment), Risk factor comments: OSA     Objective:    Today's Vitals   06/12/23 1117  Weight: 180 lb (81.6 kg)  Height: 5\' 7"  (1.702 m)   Body mass index is 28.19 kg/m.     06/12/2023   11:29 AM 02/12/2023   10:40 AM 01/22/2023   11:03 AM 01/22/2023   11:00 AM 05/03/2022   10:07 AM 11/25/2021    2:34 PM 06/10/2021    6:14 AM  Advanced Directives  Does Patient Have a Medical Advance Directive? Yes Yes Yes No No No No  Type of Estate agent of [place];Living will Healthcare Power of [place];Living will Living will;Healthcare Power of Attorney      Does patient want to make changes to medical advance directive? No - Patient declined        Copy of Healthcare Power of Attorney in Chart? No - copy requested  No - copy requested  No - copy requested    Would patient like information on creating a medical advance directive?       No - Patient declined    Current Medications (verified) Outpatient Encounter Medications as of 06/12/2023  Medication Sig   busPIRone (BUSPAR) 15 MG tablet Take 1 tablet (15 mg total) by mouth 2 (two) times daily.   citalopram (CELEXA)  20 MG tablet TAKE 1 TABLET BY MOUTH EVERY DAY   ergocalciferol (VITAMIN D2) 1.25 MG (50000 UT) capsule Take 1 capsule (50,000 Units total) by mouth once a week.   gabapentin (NEURONTIN) 300 MG capsule Take 1 capsule (300 mg total) by mouth 3 (three) times daily. Take one tablet (300 MG) by mouth in the morning and at noon, then 900 MG (three tablets) by mouth at night.   levothyroxine (SYNTHROID) 88 MCG tablet Take 1 tablet (88 mcg total) by mouth daily before breakfast.   traZODone (DESYREL) 50 MG tablet Take 2 tablets (100 mg total) by mouth at bedtime as needed.   UNABLE TO FIND Nervive 1 tablet every morning   vitamin B-12 (CYANOCOBALAMIN) 1000 MCG tablet Take 1,000 mcg by mouth once a week.   No facility-administered encounter medications on file as of 06/12/2023.    Allergies (verified) Solifenacin   History: Past Medical History:  Diagnosis Date   Hypothyroidism    Morbid obesity (HCC) 11/2019   s/p Barriatric Sgx ([facility])   Sleep apnea    doesn't use CPAP, lost weight, not needed   Past Surgical History:  Procedure Laterality Date   ABDOMINAL HYSTERECTOMY     BARIATRIC SURGERY N/A 01/02/2020   duodenal switch   CERVICAL DISC SURGERY N/A    discectomy   COLONOSCOPY     ESOPHAGOGASTRODUODENOSCOPY  11/06/2020   EYE SURGERY Bilateral    laser procedure  IR RADIOLOGIST EVAL & MGMT  04/05/2023   LAPAROSCOPIC COLON RESECTION     MASS EXCISION Left 04/13/2021   Procedure: EXCISION MASS, left lateral thigh;  Surgeon: Campbell Lerner, MD;  Location: [facility] ORS;  Service: General;  Laterality: Left;   SHOULDER SURGERY Left    torn cartilege   TONSILLECTOMY N/A    TOTAL HIP ARTHROPLASTY Right 11/02/2020   TRANSTHORACIC ECHOCARDIOGRAM  07/14/2019   a) [facility]) Technically difficult.-Used Definity.  Nl LV Fxn -EF 55 - 60%.  No obvious WMA.  Mild MR.  No effusion;; b) Echo 04/16/2021-[facility]: (Fair quality) normal LV size and function.  EF 55-60%.  No R WMA.  Normal  diastolic parameters.  Mild MR.  Unable to assess RVSP.   Family History  Problem Relation Age of Onset   Thyroid disease Mother    Cancer - Lung Father    Cancer - Lung Brother    Cancer Brother    Social History   Socioeconomic History   Marital status: Married    Spouse name: Maurine Minister   Number of children: 2   Years of education: Not on file   Highest education level: Some college, no degree  Occupational History   Occupation: retired  Tobacco Use   Smoking status: Never   Smokeless tobacco: Never  Vaping Use   Vaping status: Never Used  Substance and Sexual Activity   Alcohol use: Never   Drug use: Never   Sexual activity: Not Currently  Other Topics Concern   Not on file  Social History Narrative   04/20/22 She is retired, lives with husband.    Otherwise, she is usually on the go, not sedentary.   Social Determinants of Health   Financial Resource Strain: Medium Risk (06/12/2023)   Overall Financial Resource Strain (CARDIA)    Difficulty of Paying Living Expenses: Somewhat hard  Food Insecurity: No Food Insecurity (06/12/2023)   Hunger Vital Sign    Worried About Running Out of Food in the Last Year: Never true    Ran Out of Food in the Last Year: Never true  Transportation Needs: No Transportation Needs (06/12/2023)   PRAPARE - Administrator, Civil Service (Medical): No    Lack of Transportation (Non-Medical): No  Physical Activity: Insufficiently Active (06/12/2023)   Exercise Vital Sign    Days of Exercise per Week: 7 days    Minutes of Exercise per Session: 20 min  Stress: No Stress Concern Present (06/12/2023)   [employer] of Occupational Health - Occupational Stress Questionnaire    Feeling of Stress : Only a little  Recent Concern: Stress - Stress Concern Present (05/29/2023)   [employer] of Occupational Health - Occupational Stress Questionnaire    Feeling of Stress : To some extent  Social Connections: Socially Integrated  (06/12/2023)   Social Connection and Isolation Panel [NHANES]    Frequency of Communication with Friends and Family: More than three times a week    Frequency of Social Gatherings with Friends and Family: Three times a week    Attends Religious Services: More than 4 times per year    Active Member of Clubs or Organizations: Yes    Attends Engineer, structural: More than 4 times per year    Marital Status: Married    Tobacco Counseling Counseling given: Not Answered   Clinical Intake:  Pre-visit preparation completed: Yes  Pain : No/denies pain     BMI - recorded: 28.19 Nutritional Status: BMI 25 -29  Overweight Nutritional Risks: None Diabetes: No  How often do you need to have someone help you when you read instructions, pamphlets, or other written materials from your doctor or pharmacy?: 1 - Never  Interpreter Needed?: No  Information entered by :: Tora Kindred, CMA   Activities of Daily Living    06/12/2023   11:18 AM  In your present state of health, do you have any difficulty performing the following activities:  Hearing? 0  Vision? 0  Difficulty concentrating or making decisions? 0  Walking or climbing stairs? 1  Comment neuropathy in feet  Dressing or bathing? 0  Doing errands, shopping? 0  Preparing Food and eating ? N  Using the Toilet? N  In the past six months, have you accidently leaked urine? N  Do you have problems with loss of bowel control? N  Managing your Medications? N  Managing your Finances? N  Housekeeping or managing your Housekeeping? N    Patient Care Team: Marjie Skiff, NP as PCP - General (Nurse Practitioner) Lysle Rubens, PA-C as Consulting Physician (Physician Assistant) Marcille Blanco, MD as Referring Physician (Surgery) Dalbert Mayotte III, PA-C as Physician Assistant (Physician Assistant)  Indicate any recent Medical Services you may have received from other than [facility] providers in the past year (date may be  approximate).     Assessment:   This is a routine wellness examination for Nasir.  Hearing/Vision screen Hearing Screening - Comments:: Denies hearing loss Vision Screening - Comments:: Gets eye exams   Goals Addressed               This Visit's Progress     Patient Stated (pt-stated)        Exercise more      Depression Screen    06/12/2023   11:25 AM 05/29/2023    3:10 PM 08/29/2022    1:45 PM 05/29/2022    1:29 PM 05/03/2022   10:11 AM 04/03/2022    2:18 PM 02/20/2022    2:28 PM  PHQ 2/9 Scores  PHQ - 2 Score 2 2 1 2  0 2 4  PHQ- 9 Score 4 7 2 8 2 7 10     Fall Risk    06/12/2023   11:29 AM 05/29/2023    3:10 PM 08/29/2022    1:45 PM 05/29/2022    1:28 PM 05/03/2022   10:08 AM  Fall Risk   Falls in the past year? 0 0 0 0 0  Number falls in past yr: 0 0 0 0 0  Injury with Fall? 0 0 0 0 0  Risk for fall due to : No Fall Risks No Fall Risks No Fall Risks No Fall Risks Impaired balance/gait  Follow up Falls prevention discussed Falls evaluation completed Falls evaluation completed Falls evaluation completed Falls evaluation completed;Education provided;Falls prevention discussed    MEDICARE RISK AT HOME: Medicare Risk at Home Any stairs in or around the home?: No If so, are there any without handrails?: No Home free of loose throw rugs in walkways, pet beds, electrical cords, etc?: Yes Adequate lighting in your home to reduce risk of falls?: Yes Life alert?: No Use of a cane, walker or w/c?: No Grab bars in the bathroom?: Yes Shower chair or bench in shower?: Yes Elevated toilet seat or a handicapped toilet?: Yes  TIMED UP AND GO:  Was the test performed?  No    Cognitive Function:        06/12/2023   11:30 AM 05/29/2023  8:03 PM 05/03/2022   10:06 AM 04/28/2021    4:37 PM  6CIT Screen  What Year? 0 points 0 points 0 points 0 points  What month? 0 points 0 points 0 points 0 points  What time? 0 points 0 points 0 points 0 points  Count back from  20 0 points 0 points 0 points 0 points  Months in reverse 0 points 0 points 0 points 4 points  Repeat phrase 0 points 0 points 0 points 0 points  Total Score 0 points 0 points 0 points 4 points    Immunizations Immunization History  Administered Date(s) Administered   Fluad Quad(high Dose 65+) 05/20/2021, 05/29/2022   Fluad Trivalent(High Dose 65+) 05/17/2018, 05/29/2023   Influenza Inj Mdck Quad Pf 05/29/2017   Influenza, High Dose Seasonal PF 05/17/2018, 05/07/2019   Influenza,inj,Quad PF,6+ Mos 06/18/2015, 05/19/2016, 05/07/2019   Influenza-Unspecified 05/29/2017, 06/16/2020   PFIZER(Purple Top)SARS-COV-2 Vaccination 10/11/2019, 11/05/2019   PNEUMOCOCCAL CONJUGATE-20 05/29/2023   PPD Test 01/04/2021   Pneumococcal Conjugate-13 05/07/2019   Td 02/28/2021   Tdap 06/21/2003    TDAP status: Up to date  Flu Vaccine status: Up to date  Pneumococcal vaccine status: Up to date  Covid-19 vaccine status: Declined, Education has been provided regarding the importance of this vaccine but patient still declined. Advised may receive this vaccine at local pharmacy or Health Dept.or vaccine clinic. Aware to provide a copy of the vaccination record if obtained from local pharmacy or Health Dept. Verbalized acceptance and understanding.  Qualifies for Shingles Vaccine? Yes   Zostavax completed No   Shingrix Completed?: No.    Education has been provided regarding the importance of this vaccine. Patient has been advised to call insurance company to determine out of pocket expense if they have not yet received this vaccine. Advised may also receive vaccine at local pharmacy or Health Dept. Verbalized acceptance and understanding.  Screening Tests Health Maintenance  Topic Date Due   COVID-19 Vaccine (3 - 2023-24 season) 06/14/2023 (Originally 04/15/2023)   Zoster Vaccines- Shingrix (1 of 2) 08/29/2023 (Originally 11/12/2002)   Colonoscopy  05/28/2024 (Originally 02/18/2023)   Medicare Annual  Wellness (AWV)  06/11/2024   MAMMOGRAM  06/20/2024   DEXA SCAN  11/23/2024   DTaP/Tdap/Td (3 - Td or Tdap) 03/01/2031   Pneumonia Vaccine 36+ Years old  Completed   INFLUENZA VACCINE  Completed   Hepatitis C Screening  Completed   HPV VACCINES  Aged Out    Health Maintenance  There are no preventive care reminders to display for this patient.   Colon Cancer Screening: Patient declined  Mammogram status: Completed 06/20/22. Repeat every year. Scheduled 06/22/23  Bone Density status: Completed 11/24/19. Results reflect: Bone density results: OSTEOPENIA. Repeat every 5 years.  Lung Cancer Screening: (Low Dose CT Chest recommended if Age 40-80 years, 20 pack-year currently smoking OR have quit w/in 15years.) does not qualify.   Lung Cancer Screening Referral: n/a  Additional Screening:  Hepatitis C Screening: does not qualify; Completed 01/22/23  Vision Screening: Recommended annual ophthalmology exams for early detection of glaucoma and other disorders of the eye.  Dental Screening: Recommended annual dental exams for proper oral hygiene  Community Resource Referral / Chronic Care Management: CRR required this visit?  No   CCM required this visit?  No     Plan:     I have personally reviewed and noted the following in the patient's chart:   Medical and social history Use of alcohol, tobacco or illicit drugs  Current medications and supplements including opioid prescriptions. Patient is not currently taking opioid prescriptions. Functional ability and status Nutritional status Physical activity Advanced directives List of other physicians Hospitalizations, surgeries, and ER visits in previous 12 months Vitals Screenings to include cognitive, depression, and falls Referrals and appointments  In addition, I have reviewed and discussed with patient certain preventive protocols, quality metrics, and best practice recommendations. A written personalized care plan for  preventive services as well as general preventive health recommendations were provided to patient.     Tora Kindred, CMA   06/12/2023   After Visit Summary: (MyChart) Due to this being a telephonic visit, the after visit summary with patients personalized plan was offered to patient via MyChart   Nurse Notes:  Patient declined Covid and shingles vaccines Patient declined referral for colon cancer screening She will schedule DEXA scan

## 2023-06-22 ENCOUNTER — Ambulatory Visit
Admission: RE | Admit: 2023-06-22 | Discharge: 2023-06-22 | Disposition: A | Discharge status: Home / Self Care | Payer: Medicare HMO | Source: Ambulatory Visit | Attending: Nurse Practitioner | Admitting: Nurse Practitioner

## 2023-06-22 DIAGNOSIS — Z1231 Encounter for screening mammogram for malignant neoplasm of breast: Secondary | ICD-10-CM | POA: Diagnosis not present

## 2023-06-26 ENCOUNTER — Encounter: Payer: Self-pay | Admitting: Nurse Practitioner

## 2023-06-26 ENCOUNTER — Ambulatory Visit
Admission: RE | Admit: 2023-06-26 | Discharge: 2023-06-26 | Disposition: A | Discharge status: Home / Self Care | Payer: Medicare HMO | Source: Ambulatory Visit | Attending: Nurse Practitioner | Admitting: Nurse Practitioner

## 2023-06-26 DIAGNOSIS — M85851 Other specified disorders of bone density and structure, right thigh: Secondary | ICD-10-CM | POA: Diagnosis not present

## 2023-06-26 DIAGNOSIS — M810 Age-related osteoporosis without current pathological fracture: Secondary | ICD-10-CM | POA: Diagnosis not present

## 2023-06-26 NOTE — Progress Notes (Signed)
Contacted via MyChart   Normal mammogram, may repeat in one year:)

## 2023-06-26 NOTE — Progress Notes (Signed)
Contacted via MyChart -- please get patient schedule for visit to discuss osteoporosis treatment options   Good afternoon [name] Robinson, your bone density has returned and there had been some significant change from previous with levels now showing osteoporosis - more brittle bone.  I would like you to schedule appointment at office so we can discuss this and treatment options.  My staff will call.  Any questions? Keep being stellar!!  Thank you for allowing me to participate in your care.  I appreciate you. Kindest regards, Josphine Laffey

## 2023-06-26 NOTE — Progress Notes (Signed)
Called and scheduled patient on 07/02/2023 @ 4:20 pm.

## 2023-06-27 DIAGNOSIS — Z0100 Encounter for examination of eyes and vision without abnormal findings: Secondary | ICD-10-CM | POA: Diagnosis not present

## 2023-06-27 DIAGNOSIS — H2513 Age-related nuclear cataract, bilateral: Secondary | ICD-10-CM | POA: Diagnosis not present

## 2023-06-29 DIAGNOSIS — Z0100 Encounter for examination of eyes and vision without abnormal findings: Secondary | ICD-10-CM | POA: Diagnosis not present

## 2023-07-02 ENCOUNTER — Encounter: Payer: Self-pay | Admitting: Nurse Practitioner

## 2023-07-02 ENCOUNTER — Ambulatory Visit (INDEPENDENT_AMBULATORY_CARE_PROVIDER_SITE_OTHER): Payer: Medicare HMO | Admitting: Nurse Practitioner

## 2023-07-02 VITALS — BP 128/75 | HR 53 | Temp 98.3°F | Ht 67.0 in | Wt 184.0 lb

## 2023-07-02 DIAGNOSIS — M810 Age-related osteoporosis without current pathological fracture: Secondary | ICD-10-CM | POA: Diagnosis not present

## 2023-07-02 MED ORDER — ALENDRONATE SODIUM 70 MG PO TABS: 70.0000 mg | ORAL_TABLET | ORAL | 11 refills | Status: DC

## 2023-07-02 NOTE — Patient Instructions (Signed)
We recommend Vitamin D supplementation of about 2,0000 IUs of over the counter Vitamin D3.  In addition, we recommend a diet high in calcium with dairy and dark green leafy vegetables.  We would like you to get plenty of weight bearing exercises with walking and resistance training such as light weights or resistance bands available with instructions at places such as [employer].     Osteoporosis  Osteoporosis is when the bones get thin and weak. This can cause your bones to break (fracture) more easily. What are the causes? The exact cause of this condition is not known. What increases the risk? Having family members with this condition. Not eating enough healthy foods. Taking certain medicines. Being female. Being age 70 or older. Smoking or using other products that contain nicotine or tobacco, such as e-cigarettes or chewing tobacco. Not exercising. Being of European or Asian ancestry. Having a small body frame. What are the signs or symptoms? A broken bone might be the first sign, especially if the break results from a fall or injury that usually would not cause a bone to break. Other signs and symptoms include: Pain in the neck or low back. Being hunched over (stooped posture). Getting shorter. How is this treated? Eating more foods with more calcium and vitamin D in them. Doing exercises. Stopping tobacco use. Limiting how much alcohol you drink. Taking medicines to slow bone loss or help make the bones stronger. Taking supplements of calcium and vitamin D every day. Taking medicines to replace chemicals in the body (hormone replacement medicines). Monitoring your levels of calcium and vitamin D. The goal of treatment is to strengthen your bones and lower your risk for a bone break. Follow these instructions at home: Eating and drinking Eat plenty of calcium and vitamin D. These nutrients are good for your bones. Good sources of calcium and vitamin D include: Some fish, such  as salmon and tuna. Foods that have calcium and vitamin D added to them (fortified foods), such as some breakfast cereals. Egg yolks. Cheese. Liver.  Activity Do exercises as told by your doctor. Ask your doctor what exercises are safe for you. You should do: Exercises that make your muscles work to hold your body weight up (weight-bearing exercises). These include tai chi, yoga, and walking. Exercises to make your muscles stronger. One example is lifting weights. Lifestyle Do not drink alcohol if: Your doctor tells you not to drink. You are pregnant, may be pregnant, or are planning to become pregnant. If you drink alcohol: Limit how much you use to: 0-1 drink a day for women. 0-2 drinks a day for men. Know how much alcohol is in your drink. In the [place] one drink equals one 12 oz bottle of beer (355 mL), one 5 oz glass of wine (148 mL), or one 1 oz glass of hard liquor (44 mL). Do not smoke or use any products that contain nicotine or tobacco. If you need help quitting, ask your doctor. Preventing falls Use tools to help you move around (mobility aids) as needed. These include canes, walkers, scooters, and crutches. Keep rooms well-lit. Put away things on the floor that could make you trip. These include cords and rugs. Install safety rails on stairs. Install grab bars in bathrooms. Use rubber mats in slippery areas, like bathrooms. Wear shoes that: Fit you well. Support your feet. Have closed toes. Have rubber soles or low heels. Tell your doctor about all of the medicines you are taking. Some medicines can make  you more likely to fall. General instructions Take over-the-counter and prescription medicines only as told by your doctor. Keep all follow-up visits. Contact a doctor if: You have not been tested (screened) for osteoporosis and you are: A woman who is age 66 or older. A man who is age 41 or older. Get help right away if: You fall. You get  hurt. Summary Osteoporosis happens when your bones get thin and weak. Weak bones can break (fracture) more easily. Eat plenty of calcium and vitamin D. These are good for your bones. Tell your doctor about all of the medicines that you take. This information is not intended to replace advice given to you by your health care provider. Make sure you discuss any questions you have with your health care provider. Document Revised: 01/15/2020 Document Reviewed: 01/15/2020 Elsevier Patient Education  2024 [employer].

## 2023-07-02 NOTE — Progress Notes (Signed)
BP 128/75   Pulse (!) 53   Temp 98.3 F (36.8 C) (Oral)   Ht 5\' 7"  (1.702 m)   Wt 184 lb (83.5 kg)   SpO2 98%   BMI 28.82 kg/m    Subjective:    Patient ID: [name] Robinson, female    DOB: March 20, 1951, 72 y.o.   MRN: [id number]  HPI: [name] Robinson is a 72 y.o. female  Chief Complaint  Patient presents with   Osteoporosis   OSTEOPOROSIS Noted on DEXA on 06/26/23 with T-score -4.6.  Currently no treatment.  No recent falls or fractures.  Her sister also has this. Satisfied with current treatment?: no current treatment Past osteoporosis medications/treatments: non Adequate calcium & vitamin D: yes Intolerance to bisphosphonates: unknown Weight bearing exercises: yes   Relevant past medical, surgical, family and social history reviewed and updated as indicated. Interim medical history since our last visit reviewed. Allergies and medications reviewed and updated.  Review of Systems  Constitutional:  Negative for activity change, appetite change, diaphoresis, fatigue and fever.  Respiratory:  Negative for cough, chest tightness, shortness of breath and wheezing.   Cardiovascular:  Negative for chest pain, palpitations and leg swelling.  Gastrointestinal: Negative.   Endocrine: Negative for cold intolerance and heat intolerance.  Neurological: Negative.   Psychiatric/Behavioral: Negative.      Per HPI unless specifically indicated above     Objective:    BP 128/75   Pulse (!) 53   Temp 98.3 F (36.8 C) (Oral)   Ht 5\' 7"  (1.702 m)   Wt 184 lb (83.5 kg)   SpO2 98%   BMI 28.82 kg/m   Wt Readings from Last 3 Encounters:  07/02/23 184 lb (83.5 kg)  06/12/23 180 lb (81.6 kg)  05/29/23 180 lb 9.6 oz (81.9 kg)    Physical Exam Vitals and nursing note reviewed.  Constitutional:      General: She is awake. She is not in acute distress.    Appearance: She is well-developed and well-groomed. She is not ill-appearing or toxic-appearing.  HENT:     Head: Normocephalic.      Right Ear: Hearing normal.     Left Ear: Hearing normal.  Eyes:     General: Lids are normal.        Right eye: No discharge.        Left eye: No discharge.     Conjunctiva/sclera: Conjunctivae normal.     Pupils: Pupils are equal, round, and reactive to light.  Neck:     Thyroid: No thyromegaly.     Vascular: No carotid bruit.  Cardiovascular:     Rate and Rhythm: Regular rhythm. Bradycardia present.     Heart sounds: Normal heart sounds. No murmur heard.    No gallop.  Pulmonary:     Effort: Pulmonary effort is normal. No accessory muscle usage or respiratory distress.     Breath sounds: Normal breath sounds.  Abdominal:     General: Bowel sounds are normal.     Palpations: Abdomen is soft.  Musculoskeletal:     Cervical back: Normal range of motion and neck supple.     Right lower leg: No edema.     Left lower leg: No edema.  Skin:    General: Skin is warm and dry.  Neurological:     Mental Status: She is alert and oriented to person, place, and time.  Psychiatric:        Attention and Perception: Attention normal.  Mood and Affect: Mood normal.        Speech: Speech normal.        Behavior: Behavior normal. Behavior is cooperative.        Thought Content: Thought content normal.     Results for orders placed or performed in visit on 05/29/23  Microscopic Examination   Urine  Result Value Ref Range   WBC, UA 0-5 0 - 5 /hpf   RBC, Urine 0-2 0 - 2 /hpf   Epithelial Cells (non renal) 0-10 0 - 10 /hpf   Bacteria, UA None seen None seen/Few  CBC with Differential/Platelet  Result Value Ref Range   WBC 5.0 3.4 - 10.8 x10E3/uL   RBC 4.32 3.77 - 5.28 x10E6/uL   Hemoglobin 12.9 11.1 - 15.9 g/dL   Hematocrit 13.2 44.0 - 46.6 %   MCV 91 79 - 97 fL   MCH 29.9 26.6 - 33.0 pg   MCHC 32.9 31.5 - 35.7 g/dL   RDW 10.2 72.5 - 36.6 %   Platelets 176 150 - 450 x10E3/uL   Neutrophils 61 Not Estab. %   Lymphs 32 Not Estab. %   Monocytes 6 Not Estab. %   Eos 0 Not  Estab. %   Basos 1 Not Estab. %   Neutrophils Absolute 3.1 1.4 - 7.0 x10E3/uL   Lymphocytes Absolute 1.6 0.7 - 3.1 x10E3/uL   Monocytes Absolute 0.3 0.1 - 0.9 x10E3/uL   EOS (ABSOLUTE) 0.0 0.0 - 0.4 x10E3/uL   Basophils Absolute 0.0 0.0 - 0.2 x10E3/uL   Immature Granulocytes 0 Not Estab. %   Immature Grans (Abs) 0.0 0.0 - 0.1 x10E3/uL  Comprehensive metabolic panel  Result Value Ref Range   Glucose 79 70 - 99 mg/dL   BUN 17 8 - 27 mg/dL   Creatinine, Ser 4.40 0.57 - 1.00 mg/dL   eGFR 93 >34 VQ/QVZ/5.63   BUN/Creatinine Ratio 24 12 - 28   Sodium 141 134 - 144 mmol/L   Potassium 3.9 3.5 - 5.2 mmol/L   Chloride 102 96 - 106 mmol/L   CO2 27 20 - 29 mmol/L   Calcium 9.4 8.7 - 10.3 mg/dL   Total Protein 6.6 6.0 - 8.5 g/dL   Albumin 4.5 3.9 - 4.9 g/dL   Globulin, Total 2.1 1.5 - 4.5 g/dL   Bilirubin Total 0.7 0.0 - 1.2 mg/dL   Alkaline Phosphatase 109 44 - 121 IU/L   AST 18 0 - 40 IU/L   ALT 13 0 - 32 IU/L  Lipid Panel w/o Chol/HDL Ratio  Result Value Ref Range   Cholesterol, Total 184 100 - 199 mg/dL   Triglycerides 875 0 - 149 mg/dL   HDL 86 >64 mg/dL   VLDL Cholesterol Cal 19 5 - 40 mg/dL   LDL Chol Calc (NIH) 79 0 - 99 mg/dL  VITAMIN D 25 Hydroxy (Vit-D Deficiency, Fractures)  Result Value Ref Range   Vit D, 25-Hydroxy 48.5 30.0 - 100.0 ng/mL  Iron Binding Cap (TIBC)([facility])  Result Value Ref Range   Total Iron Binding Capacity 270 250 - 450 ug/dL   UIBC 332 951 - 884 ug/dL   Iron 74 27 - 166 ug/dL   Iron Saturation 27 15 - 55 %  Ferritin  Result Value Ref Range   Ferritin 876 (H) 15 - 150 ng/mL  Vitamin B12  Result Value Ref Range   Vitamin B-12 986 232 - 1,245 pg/mL  Magnesium  Result Value Ref Range   Magnesium 2.1 1.6 -  2.3 mg/dL  Urinalysis, Routine w reflex microscopic  Result Value Ref Range   Specific Gravity, UA 1.015 1.005 - 1.030   pH, UA 5.5 5.0 - 7.5   Color, UA Yellow Yellow   Appearance Ur Clear Clear   Leukocytes,UA Negative Negative    Protein,UA Negative Negative/Trace   Glucose, UA Negative Negative   Ketones, UA Negative Negative   RBC, UA Trace (A) Negative   Bilirubin, UA Negative Negative   Urobilinogen, Ur 0.2 0.2 - 1.0 mg/dL   Nitrite, UA Negative Negative   Microscopic Examination See below:       Assessment & Plan:   Problem List Items Addressed This Visit       Musculoskeletal and Integument   Osteoporosis - Primary    New diagnosis on 06/26/23.  Educated her on treatment options.  Will start out with Fosamax weekly, educated her on side effects and how to take this.  She is to alert provider if side effects present.  Plan on return in January for follow-up, if not tolerating consider Prolia, which we discussed today.  Repeat DEXA around 06/25/25.      Relevant Medications   alendronate (FOSAMAX) 70 MG tablet     Follow up plan: Return for as scheduled in January.

## 2023-07-02 NOTE — Assessment & Plan Note (Signed)
New diagnosis on 06/26/23.  Educated her on treatment options.  Will start out with Fosamax weekly, educated her on side effects and how to take this.  She is to alert provider if side effects present.  Plan on return in January for follow-up, if not tolerating consider Prolia, which we discussed today.  Repeat DEXA around 06/25/25.

## 2023-07-10 ENCOUNTER — Encounter: Payer: Self-pay | Admitting: Nurse Practitioner

## 2023-07-10 DIAGNOSIS — Z0100 Encounter for examination of eyes and vision without abnormal findings: Secondary | ICD-10-CM | POA: Diagnosis not present

## 2023-08-16 ENCOUNTER — Other Ambulatory Visit: Payer: Self-pay | Admitting: Nurse Practitioner

## 2023-08-18 ENCOUNTER — Other Ambulatory Visit: Payer: Self-pay | Admitting: Nurse Practitioner

## 2023-08-20 ENCOUNTER — Encounter: Payer: Self-pay | Admitting: Family Medicine

## 2023-08-20 ENCOUNTER — Telehealth: Payer: Self-pay | Admitting: Nurse Practitioner

## 2023-08-20 ENCOUNTER — Ambulatory Visit (INDEPENDENT_AMBULATORY_CARE_PROVIDER_SITE_OTHER): Payer: Medicare HMO | Admitting: Family Medicine

## 2023-08-20 VITALS — BP 114/74 | HR 62 | Wt 183.8 lb

## 2023-08-20 DIAGNOSIS — J189 Pneumonia, unspecified organism: Secondary | ICD-10-CM | POA: Diagnosis not present

## 2023-08-20 MED ORDER — GABAPENTIN 300 MG PO CAPS: 300.0000 mg | ORAL_CAPSULE | Freq: Three times a day (TID) | ORAL | 1 refills | Status: DC

## 2023-08-20 MED ORDER — AZITHROMYCIN 250 MG PO TABS
ORAL_TABLET | ORAL | 0 refills | Status: AC
Start: 2023-08-20 — End: 2023-08-25

## 2023-08-20 NOTE — Telephone Encounter (Signed)
 Requested medication (s) are due for refill today: yes  Requested medication (s) are on the active medication list: yes  Last refill:  06/14/23 12 caps 5 RF  Future visit scheduled: yes  Notes to clinic:  med not not delegated to NT to reorder or refuse   Requested Prescriptions  Pending Prescriptions Disp Refills   Vitamin D , Ergocalciferol , (DRISDOL ) 1.25 MG (50000 UNIT) CAPS capsule [Pharmacy Med Name: VITAMIN D2 1.25MG (50,000 UNIT)] 12 capsule 5    Sig: TAKE 1 CAPSULE BY MOUTH ONE TIME PER WEEK     Endocrinology:  Vitamins - Vitamin D  Supplementation 2 Failed - 08/20/2023 10:09 AM      Failed - Manual Review: Route requests for 50,000 IU strength to the provider      Passed - Ca in normal range and within 360 days    Calcium  Date Value Ref Range Status  05/29/2023 9.4 8.7 - 10.3 mg/dL Final         Passed - Vitamin D  in normal range and within 360 days    Vit D, 25-Hydroxy  Date Value Ref Range Status  05/29/2023 48.5 30.0 - 100.0 ng/mL Final    Comment:    Vitamin D  deficiency has been defined by the Institute of Medicine and an Endocrine Society practice guideline as a level of serum 25-OH vitamin D  less than 20 ng/mL (1,2). The Endocrine Society went on to further define vitamin D  insufficiency as a level between 21 and 29 ng/mL (2). 1. IOM (Institute of Medicine). 2010. Dietary reference    intakes for calcium and D. [place]: The    [employer]. 2. Holick MF, Binkley [place], Bischoff-Ferrari HA, et al.    Evaluation, treatment, and prevention of vitamin D     deficiency: an Endocrine Society clinical practice    guideline. JCEM. 2011 Jul; 96(7):1911-30.          Passed - Valid encounter within last 12 months    Recent Outpatient Visits           1 month ago Age-related osteoporosis without current pathological fracture   [place] [facility] [place], Melanie T, NP   2 months ago Takotsubo cardiomyopathy   [place] [facility] [place], [place] T, NP   11 months ago Moderate protein-calorie malnutrition (HCC)   [place] [facility] [place], Melanie T, NP   1 year ago Acute bilateral low back pain without sciatica   [place] [facility] [place], Rocky BRAVO, PA-C   1 year ago Moderate protein-calorie malnutrition (HCC)   [place] [facility] [place], Melanie DASEN, NP       Future Appointments             Today Vicci Duwaine SQUIBB, DO [place] [facility]   In 1 week Valerio Melanie DASEN, NP  [facility], [facility]   In 1 month Hester Alm BROCKS, MD [facility]

## 2023-08-20 NOTE — Telephone Encounter (Signed)
 FYI.......Marland KitchenPt has an appointment on 08/20/2023 @ 10:40 am this morning.

## 2023-08-20 NOTE — Telephone Encounter (Signed)
 Prescription Request  08/20/2023  LOV: 07/02/2023  What is the name of the medication or equipment? gabapentin  (NEURONTIN ) 300 MG   Have you contacted your pharmacy to request a refill? Yes   Which pharmacy would you like this sent to?  [id number] - [place] - [address] [place] [address] Phone: [phone] Fax: [phone]    Patient notified that their request is being sent to the clinical staff for review and that they should receive a response within 2 business days.   Please advise at Mobile [phone] (mobile)

## 2023-08-20 NOTE — Telephone Encounter (Signed)
 Prescription sent in this morning. Called and notified patient that prescription has been sent in.

## 2023-08-20 NOTE — Progress Notes (Signed)
 BP 114/74   Pulse 62   Wt 183 lb 12.8 oz (83.4 kg)   SpO2 97%   BMI 28.79 kg/m    Subjective:    Patient ID: [name] Robinson, female    DOB: 12/29/1950, 73 y.o.   MRN: [id number]  HPI: [name] Robinson is a 73 y.o. female  Chief Complaint  Patient presents with   Sinus Problem    Patient says she has been sick since the day after Christmas. Patient says she fatigue, post nasal drainage, lingering cough and sinus/facial pressure. Patient says she has tried Mucinex for 5 days and during hot steam with a towel twice a day. Patient declines having any fevers. Patient says she would like her lungs checked, as a lot of church members are sick with Pneumonia.    Fatigue   Cough   UPPER RESPIRATORY TRACT INFECTION Duration: 10-14 days Worst symptom: drainage and cough Fever: no Cough: yes Shortness of breath: no Wheezing: no Chest pain: no Chest tightness: no Chest congestion: no Nasal congestion: yes Runny nose: yes Post nasal drip: yes Sneezing: no Sore throat: no Swollen glands: no Sinus pressure: yes Headache: yes Face pain: yes Toothache: no Ear pain: no  Ear pressure: no  Eyes red/itching:no Eye drainage/crusting: no  Vomiting: no Rash: no Fatigue: yes Sick contacts: no Strep contacts: no  Context: worse Recurrent sinusitis: no Relief with OTC cold/cough medications: no  Treatments attempted: steam, mucinex   Relevant past medical, surgical, family and social history reviewed and updated as indicated. Interim medical history since our last visit reviewed. Allergies and medications reviewed and updated.  Review of Systems  Constitutional:  Positive for fatigue and fever. Negative for activity change, appetite change, chills, diaphoresis and unexpected weight change.  HENT:  Positive for congestion, postnasal drip, rhinorrhea, sinus pressure and sore throat. Negative for dental problem, drooling, ear discharge, ear pain, facial swelling, hearing loss, mouth  sores, nosebleeds, sinus pain, sneezing, tinnitus, trouble swallowing and voice change.   Eyes: Negative.   Respiratory:  Positive for cough. Negative for apnea, choking, chest tightness, shortness of breath, wheezing and stridor.   Cardiovascular: Negative.   Psychiatric/Behavioral: Negative.      Per HPI unless specifically indicated above     Objective:    BP 114/74   Pulse 62   Wt 183 lb 12.8 oz (83.4 kg)   SpO2 97%   BMI 28.79 kg/m   Wt Readings from Last 3 Encounters:  08/20/23 183 lb 12.8 oz (83.4 kg)  07/02/23 184 lb (83.5 kg)  06/12/23 180 lb (81.6 kg)    Physical Exam Vitals and nursing note reviewed.  Constitutional:      General: She is not in acute distress.    Appearance: Normal appearance. She is normal weight. She is not ill-appearing, toxic-appearing or diaphoretic.  HENT:     Head: Normocephalic and atraumatic.     Right Ear: Tympanic membrane, ear canal and external ear normal. There is no impacted cerumen.     Left Ear: Tympanic membrane, ear canal and external ear normal. There is no impacted cerumen.     Nose: Rhinorrhea present. No congestion.     Mouth/Throat:     Mouth: Mucous membranes are moist.     Pharynx: Oropharynx is clear. No oropharyngeal exudate or posterior oropharyngeal erythema.  Eyes:     General: No scleral icterus.       Right eye: No discharge.        Left eye: No discharge.  Extraocular Movements: Extraocular movements intact.     Conjunctiva/sclera: Conjunctivae normal.     Pupils: Pupils are equal, round, and reactive to light.  Cardiovascular:     Rate and Rhythm: Normal rate and regular rhythm.     Pulses: Normal pulses.     Heart sounds: Normal heart sounds. No murmur heard.    No friction rub. No gallop.  Pulmonary:     Effort: Pulmonary effort is normal. No respiratory distress.     Breath sounds: No stridor. Rhonchi present. No wheezing or rales.     Comments: Fine rhonchi throughout Chest:     Chest wall: No  tenderness.  Musculoskeletal:        General: Normal range of motion.     Cervical back: Normal range of motion and neck supple.  Skin:    General: Skin is warm and dry.     Capillary Refill: Capillary refill takes less than 2 seconds.     Coloration: Skin is not jaundiced or pale.     Findings: No bruising, erythema, lesion or rash.  Neurological:     General: No focal deficit present.     Mental Status: She is alert and oriented to person, place, and time. Mental status is at baseline.  Psychiatric:        Mood and Affect: Mood normal.        Behavior: Behavior normal.        Thought Content: Thought content normal.        Judgment: Judgment normal.     Results for orders placed or performed in visit on 05/29/23  Microscopic Examination   Collection Time: 05/29/23  3:54 PM   Urine  Result Value Ref Range   WBC, UA 0-5 0 - 5 /hpf   RBC, Urine 0-2 0 - 2 /hpf   Epithelial Cells (non renal) 0-10 0 - 10 /hpf   Bacteria, UA None seen None seen/Few  Urinalysis, Routine w reflex microscopic   Collection Time: 05/29/23  3:54 PM  Result Value Ref Range   Specific Gravity, UA 1.015 1.005 - 1.030   pH, UA 5.5 5.0 - 7.5   Color, UA Yellow Yellow   Appearance Ur Clear Clear   Leukocytes,UA Negative Negative   Protein,UA Negative Negative/Trace   Glucose, UA Negative Negative   Ketones, UA Negative Negative   RBC, UA Trace (A) Negative   Bilirubin, UA Negative Negative   Urobilinogen, Ur 0.2 0.2 - 1.0 mg/dL   Nitrite, UA Negative Negative   Microscopic Examination See below:   CBC with Differential/Platelet   Collection Time: 05/29/23  3:56 PM  Result Value Ref Range   WBC 5.0 3.4 - 10.8 x10E3/uL   RBC 4.32 3.77 - 5.28 x10E6/uL   Hemoglobin 12.9 11.1 - 15.9 g/dL   Hematocrit 60.7 65.9 - 46.6 %   MCV 91 79 - 97 fL   MCH 29.9 26.6 - 33.0 pg   MCHC 32.9 31.5 - 35.7 g/dL   RDW 88.0 88.2 - 84.5 %   Platelets 176 150 - 450 x10E3/uL   Neutrophils 61 Not Estab. %   Lymphs 32 Not  Estab. %   Monocytes 6 Not Estab. %   Eos 0 Not Estab. %   Basos 1 Not Estab. %   Neutrophils Absolute 3.1 1.4 - 7.0 x10E3/uL   Lymphocytes Absolute 1.6 0.7 - 3.1 x10E3/uL   Monocytes Absolute 0.3 0.1 - 0.9 x10E3/uL   EOS (ABSOLUTE) 0.0 0.0 - 0.4 x10E3/uL  Basophils Absolute 0.0 0.0 - 0.2 x10E3/uL   Immature Granulocytes 0 Not Estab. %   Immature Grans (Abs) 0.0 0.0 - 0.1 x10E3/uL  Comprehensive metabolic panel   Collection Time: 05/29/23  3:56 PM  Result Value Ref Range   Glucose 79 70 - 99 mg/dL   BUN 17 8 - 27 mg/dL   Creatinine, Ser 9.29 0.57 - 1.00 mg/dL   eGFR 93 >40 fO/fpw/8.26   BUN/Creatinine Ratio 24 12 - 28   Sodium 141 134 - 144 mmol/L   Potassium 3.9 3.5 - 5.2 mmol/L   Chloride 102 96 - 106 mmol/L   CO2 27 20 - 29 mmol/L   Calcium 9.4 8.7 - 10.3 mg/dL   Total Protein 6.6 6.0 - 8.5 g/dL   Albumin 4.5 3.9 - 4.9 g/dL   Globulin, Total 2.1 1.5 - 4.5 g/dL   Bilirubin Total 0.7 0.0 - 1.2 mg/dL   Alkaline Phosphatase 109 44 - 121 IU/L   AST 18 0 - 40 IU/L   ALT 13 0 - 32 IU/L  Lipid Panel w/o Chol/HDL Ratio   Collection Time: 05/29/23  3:56 PM  Result Value Ref Range   Cholesterol, Total 184 100 - 199 mg/dL   Triglycerides 890 0 - 149 mg/dL   HDL 86 >60 mg/dL   VLDL Cholesterol Cal 19 5 - 40 mg/dL   LDL Chol Calc (NIH) 79 0 - 99 mg/dL  VITAMIN D  25 Hydroxy (Vit-D Deficiency, Fractures)   Collection Time: 05/29/23  3:56 PM  Result Value Ref Range   Vit D, 25-Hydroxy 48.5 30.0 - 100.0 ng/mL  Iron Binding Cap (TIBC)([facility])   Collection Time: 05/29/23  3:56 PM  Result Value Ref Range   Total Iron Binding Capacity 270 250 - 450 ug/dL   UIBC 803 881 - 630 ug/dL   Iron 74 27 - 860 ug/dL   Iron Saturation 27 15 - 55 %  Ferritin   Collection Time: 05/29/23  3:56 PM  Result Value Ref Range   Ferritin 876 (H) 15 - 150 ng/mL  Vitamin B12   Collection Time: 05/29/23  3:56 PM  Result Value Ref Range   Vitamin B-12 986 232 - 1,245 pg/mL  Magnesium    Collection Time: 05/29/23  3:56 PM  Result Value Ref Range   Magnesium 2.1 1.6 - 2.3 mg/dL      Assessment & Plan:   Problem List Items Addressed This Visit   None Visit Diagnoses       Atypical pneumonia    -  Primary   Will treat with azithromycin . Call with any concerns. Continue to monitor.   Relevant Medications   azithromycin  (ZITHROMAX ) 250 MG tablet        Follow up plan: Return 2 weeks for lung recheck with me or Jolene.

## 2023-08-21 ENCOUNTER — Other Ambulatory Visit: Payer: Medicare HMO

## 2023-08-21 NOTE — Telephone Encounter (Signed)
 Requested Prescriptions  Pending Prescriptions Disp Refills   traZODone  (DESYREL ) 50 MG tablet [Pharmacy Med Name: TRAZODONE  50 MG TABLET] 180 tablet 1    Sig: TAKE 2 TABLETS BY MOUTH AT BEDTIME AS NEEDED.     Psychiatry: Antidepressants - Serotonin Modulator Passed - 08/21/2023  2:34 PM      Passed - Valid encounter within last 6 months    Recent Outpatient Visits           Yesterday Atypical pneumonia   [place] [name] [place], Megan P, DO   1 month ago Age-related osteoporosis without current pathological fracture   [place] [facility] [place], Melanie T, NP   2 months ago Takotsubo cardiomyopathy   [place] [facility] [place], Melanie T, NP   11 months ago Moderate protein-calorie malnutrition (HCC)   [place] [facility] [place], Melanie T, NP   1 year ago Acute bilateral low back pain without sciatica   [place] [facility] [place], Rocky BRAVO, PA-C       Future Appointments             In 2 weeks Valerio Melanie DASEN, NP [place] [facility], [facility]   In 1 month Hester Alm BROCKS, MD [place] [facility]            Refused Prescriptions Disp Refills   gabapentin  (NEURONTIN ) 300 MG capsule [Pharmacy Med Name: GABAPENTIN  300 MG CAPSULE] 450 capsule 4    Sig: TAKE 1 CAPSULE BY MOUTH EVERY MORNING AND AT NOON THEN 3 CAPSULES (900MG ) BY MOUTH AT BEDTIME     Neurology: Anticonvulsants - gabapentin  Passed - 08/21/2023  2:34 PM      Passed - Cr in normal range and within 360 days    Creatinine, Ser  Date Value Ref Range Status  05/29/2023 0.70 0.57 - 1.00 mg/dL Final         Passed - Completed PHQ-2 or PHQ-9 in the last 360 days      Passed - Valid encounter within last 12 months    Recent Outpatient Visits           Yesterday Atypical pneumonia   [place] [facility] [place], Megan P, DO   1 month ago Age-related osteoporosis without current  pathological fracture   [place] [facility] [place], Melanie T, NP   2 months ago Takotsubo cardiomyopathy   [place] [facility] [place], [place] T, NP   11 months ago Moderate protein-calorie malnutrition (HCC)   [place] [facility] [place], Melanie T, NP   1 year ago Acute bilateral low back pain without sciatica   [place] [facility] Mecum, Rocky BRAVO, PA-C       Future Appointments             In 2 weeks [place], Jolene T, NP [place] [facility], [facility]   In 1 month Hester Alm BROCKS, MD [facility]

## 2023-08-24 ENCOUNTER — Ambulatory Visit: Payer: Medicare HMO | Admitting: Oncology

## 2023-08-29 ENCOUNTER — Ambulatory Visit: Payer: Medicare HMO | Admitting: Nurse Practitioner

## 2023-09-03 NOTE — Patient Instructions (Signed)
Neuropathic Pain Neuropathic pain is pain caused by damage to the nerves that are responsible for certain sensations in your body (sensory nerves). Neuropathic pain can make you more sensitive to pain. Even a minor sensation can feel very painful. This is usually a long-term (chronic) condition that can be difficult to treat. The type of pain differs from person to person. It may: Start suddenly (acute), or it may develop slowly and become chronic. Come and go as damaged nerves heal, or it may stay at the same level for years. Cause emotional distress, loss of sleep, and a lower quality of life. What are the causes? The most common cause of this condition is diabetes. Many other diseases and conditions can also cause neuropathic pain. Causes of neuropathic pain can be classified as: Toxic. This is caused by medicines and chemicals. The most common causes of toxic neuropathic pain is damage from medicines that kill cancer cells (chemotherapy) or alcohol abuse. Metabolic. This can be caused by: Diabetes. Lack of vitamins like B12. Traumatic. Any injury that cuts, crushes, or stretches a nerve can cause damage and pain. Compression-related. If a sensory nerve gets trapped or compressed for a long period of time, the blood supply to the nerve can be cut off. Vascular. Many blood vessel diseases can cause neuropathic pain by decreasing blood supply and oxygen to nerves. Autoimmune. This type of pain results from diseases in which the body's defense system (immune system) mistakenly attacks sensory nerves. Examples of autoimmune diseases that can cause neuropathic pain include lupus and multiple sclerosis. Infectious. Many types of viral infections can damage sensory nerves and cause pain. Shingles infection is a common cause of this type of pain. Inherited. Neuropathic pain can be a symptom of many diseases that are passed down through families (genetic). What increases the risk? You are more likely to  develop this condition if: You have diabetes. You smoke. You drink too much alcohol. You are taking certain medicines, including chemotherapy or medicines that treat immune system disorders. What are the signs or symptoms? The main symptom is pain. Neuropathic pain is often described as: Burning. Shock-like. Stinging. Hot or cold. Itching. How is this diagnosed? No single test can diagnose neuropathic pain. It is diagnosed based on: A physical exam and your symptoms. Your health care provider will ask you about your pain. You may be asked to use a pain scale to describe how bad your pain is. Tests. These may be done to see if you have a cause and location of any nerve damage. They include: Nerve conduction studies and electromyography to test how well nerve signals travel through your nerves and muscles (electrodiagnostic testing). Skin biopsy to evaluate for small fiber neuropathy. Imaging studies, such as: X-rays. CT scan. MRI. How is this treated? Treatment for neuropathic pain may change over time. You may need to try different treatment options or a combination of treatments. Some options include: Treating the underlying cause of the neuropathy, such as diabetes, kidney disease, or vitamin deficiencies. Stopping medicines that can cause neuropathy, such as chemotherapy. Medicine to relieve pain. Medicines may include: Prescription or over-the-counter pain medicine. Anti-seizure medicine. Antidepressant medicines. Pain-relieving patches or creams that are applied to painful areas of skin. A medicine to numb the area (local anesthetic), which can be injected as a nerve block. Transcutaneous nerve stimulation. This uses electrical currents to block painful nerve signals. The treatment is painless. Alternative treatments, such as: Acupuncture. Meditation. Massage. Occupational or physical therapy. Pain management programs. Counseling. Follow   these instructions at  home: Medicines  Take over-the-counter and prescription medicines only as told by your health care provider. Ask your health care provider if the medicine prescribed to you: Requires you to avoid driving or using machinery. Can cause constipation. You may need to take these actions to prevent or treat constipation: Drink enough fluid to keep your urine pale yellow. Take over-the-counter or prescription medicines. Eat foods that are high in fiber, such as beans, whole grains, and fresh fruits and vegetables. Limit foods that are high in fat and processed sugars, such as fried or sweet foods. Lifestyle  Have a good support system at home. Consider joining a chronic pain support group. Do not use any products that contain nicotine or tobacco. These products include cigarettes, chewing tobacco, and vaping devices, such as e-cigarettes. If you need help quitting, ask your health care provider. Do not drink alcohol. General instructions Learn as much as you can about your condition. Work closely with all your health care providers to find the treatment plan that works best for you. Ask your health care provider what activities are safe for you. Keep all follow-up visits. This is important. Contact a health care provider if: Your pain treatments are not working. You are having side effects from your medicines. You are struggling with tiredness (fatigue), mood changes, depression, or anxiety. Get help right away if: You have thoughts of hurting yourself. Get help right away if you feel like you may hurt yourself or others, or have thoughts about taking your own life. Go to your nearest emergency room or: Call 911. Call the National Suicide Prevention Lifeline at 1-800-273-8255 or 988. This is open 24 hours a day. Text the Crisis Text Line at 741741. Summary Neuropathic pain is pain caused by damage to the nerves that are responsible for certain sensations in your body (sensory  nerves). Neuropathic pain may come and go as damaged nerves heal, or it may stay at the same level for years. Neuropathic pain is usually a long-term condition that can be difficult to treat. Consider joining a chronic pain support group. This information is not intended to replace advice given to you by your health care provider. Make sure you discuss any questions you have with your health care provider. Document Revised: 03/28/2021 Document Reviewed: 03/28/2021 Elsevier Patient Education  2024 [employer]  

## 2023-09-05 ENCOUNTER — Encounter: Payer: Self-pay | Admitting: Nurse Practitioner

## 2023-09-07 ENCOUNTER — Encounter: Payer: Self-pay | Admitting: Nurse Practitioner

## 2023-09-07 ENCOUNTER — Ambulatory Visit (INDEPENDENT_AMBULATORY_CARE_PROVIDER_SITE_OTHER): Payer: Medicare HMO | Admitting: Nurse Practitioner

## 2023-09-07 VITALS — BP 124/71 | HR 60 | Temp 97.8°F | Ht 67.0 in | Wt 185.6 lb

## 2023-09-07 DIAGNOSIS — G629 Polyneuropathy, unspecified: Secondary | ICD-10-CM | POA: Diagnosis not present

## 2023-09-07 DIAGNOSIS — R7989 Other specified abnormal findings of blood chemistry: Secondary | ICD-10-CM

## 2023-09-07 DIAGNOSIS — E063 Autoimmune thyroiditis: Secondary | ICD-10-CM

## 2023-09-07 MED ORDER — GABAPENTIN 300 MG PO CAPS: 300.0000 mg | ORAL_CAPSULE | Freq: Three times a day (TID) | ORAL | Status: DC

## 2023-09-07 NOTE — Assessment & Plan Note (Signed)
Chronic, stable.  Continue current medication regimen and adjust as needed.  Thyroid ultrasound completed and overall reassuring.  Continue collaboration with endo.

## 2023-09-07 NOTE — Assessment & Plan Note (Signed)
Ongoing since sepsis almost 2 years ago.  Last level in 800 range.  Recheck CBC and ferritin today.  Return to hematology as needed.

## 2023-09-07 NOTE — Progress Notes (Signed)
BP 124/71   Pulse 60   Temp 97.8 F (36.6 C) (Oral)   Ht 5\' 7"  (1.702 m)   Wt 185 lb 9.6 oz (84.2 kg)   SpO2 98%   BMI 29.07 kg/m    Subjective:    Patient ID: [name] Robinson, female    DOB: [birth year], 73 y.o.   MRN: [id number]  HPI: [name] Robinson is a 73 y.o. female  Chief Complaint  Patient presents with   Neurologic Problem   NEUROPATHY Follow-up today for neuropathy.  This started after hospitalization with sepsis almost 2 years ago as of (March 28th, 2023). Takes Gabapentin TID and B12 daily.  Referral to neurology placed 05/29/23.  She is scheduled to see them 10/16/23, had sooner appointment but there was pneumonia at the time.  Treated for pneumonia 08/20/23 with Azithromycin.  Reports feeling okay, with exception of lingering cough (non productive), improving.  Has hypothyroid, needs labs checked.  Last visit ferritin slightly elevated. Neuropathy status: uncontrolled  Satisfied with current treatment?: yes Medication side effects: no Medication compliance:  good compliance Location: both feet and calves -- legs will feel heavy at times Frequency: constant Bilateral: yes Symmetric: yes Numbness: no Decreased sensation: no Weakness: no Context: fluctuating Alleviating factors: Gabapentin  Aggravating factors: unknown Treatments attempted: Gabapentin, Nervive  Relevant past medical, surgical, family and social history reviewed and updated as indicated. Interim medical history since our last visit reviewed. Allergies and medications reviewed and updated.  Review of Systems  Constitutional:  Negative for activity change, appetite change, diaphoresis, fatigue and fever.  Respiratory:  Negative for cough, chest tightness, shortness of breath and wheezing.   Cardiovascular:  Negative for chest pain, palpitations and leg swelling.  Gastrointestinal: Negative.   Endocrine: Negative for cold intolerance and heat intolerance.  Neurological: Negative.    Psychiatric/Behavioral: Negative.     Per HPI unless specifically indicated above     Objective:    BP 124/71   Pulse 60   Temp 97.8 F (36.6 C) (Oral)   Ht 5\' 7"  (1.702 m)   Wt 185 lb 9.6 oz (84.2 kg)   SpO2 98%   BMI 29.07 kg/m   Wt Readings from Last 3 Encounters:  09/07/23 185 lb 9.6 oz (84.2 kg)  08/20/23 183 lb 12.8 oz (83.4 kg)  07/02/23 184 lb (83.5 kg)    Physical Exam Vitals and nursing note reviewed.  Constitutional:      General: She is awake. She is not in acute distress.    Appearance: She is well-developed and well-groomed. She is not ill-appearing or toxic-appearing.  HENT:     Head: Normocephalic.     Right Ear: Hearing and external ear normal.     Left Ear: Hearing and external ear normal.  Eyes:     General: Lids are normal.        Right eye: No discharge.        Left eye: No discharge.     Conjunctiva/sclera: Conjunctivae normal.     Pupils: Pupils are equal, round, and reactive to light.  Neck:     Thyroid: No thyromegaly.     Vascular: No carotid bruit.  Cardiovascular:     Rate and Rhythm: Normal rate and regular rhythm.     Heart sounds: Normal heart sounds. No murmur heard.    No gallop.  Pulmonary:     Effort: Pulmonary effort is normal. No accessory muscle usage or respiratory distress.     Breath sounds: Normal breath sounds.  Abdominal:     General: Bowel sounds are normal. There is no distension.     Palpations: Abdomen is soft.     Tenderness: There is no abdominal tenderness.  Musculoskeletal:     Cervical back: Normal range of motion and neck supple.     Right lower leg: No edema.     Left lower leg: No edema.  Lymphadenopathy:     Cervical: No cervical adenopathy.  Skin:    General: Skin is warm and dry.  Neurological:     Mental Status: She is alert and oriented to person, place, and time.     Deep Tendon Reflexes: Reflexes are normal and symmetric.     Reflex Scores:      Brachioradialis reflexes are 2+ on the right  side and 2+ on the left side.      Patellar reflexes are 2+ on the right side and 2+ on the left side. Psychiatric:        Attention and Perception: Attention normal.        Mood and Affect: Mood normal.        Speech: Speech normal.        Behavior: Behavior normal. Behavior is cooperative.        Thought Content: Thought content normal.    Results for orders placed or performed in visit on 05/29/23  Microscopic Examination   Collection Time: 05/29/23  3:54 PM   Urine  Result Value Ref Range   WBC, UA 0-5 0 - 5 /hpf   RBC, Urine 0-2 0 - 2 /hpf   Epithelial Cells (non renal) 0-10 0 - 10 /hpf   Bacteria, UA None seen None seen/Few  Urinalysis, Routine w reflex microscopic   Collection Time: 05/29/23  3:54 PM  Result Value Ref Range   Specific Gravity, UA 1.015 1.005 - 1.030   pH, UA 5.5 5.0 - 7.5   Color, UA Yellow Yellow   Appearance Ur Clear Clear   Leukocytes,UA Negative Negative   Protein,UA Negative Negative/Trace   Glucose, UA Negative Negative   Ketones, UA Negative Negative   RBC, UA Trace (A) Negative   Bilirubin, UA Negative Negative   Urobilinogen, Ur 0.2 0.2 - 1.0 mg/dL   Nitrite, UA Negative Negative   Microscopic Examination See below:   CBC with Differential/Platelet   Collection Time: 05/29/23  3:56 PM  Result Value Ref Range   WBC 5.0 3.4 - 10.8 x10E3/uL   RBC 4.32 3.77 - 5.28 x10E6/uL   Hemoglobin 12.9 11.1 - 15.9 g/dL   Hematocrit 78.2 95.6 - 46.6 %   MCV 91 79 - 97 fL   MCH 29.9 26.6 - 33.0 pg   MCHC 32.9 31.5 - 35.7 g/dL   RDW 21.3 08.6 - 57.8 %   Platelets 176 150 - 450 x10E3/uL   Neutrophils 61 Not Estab. %   Lymphs 32 Not Estab. %   Monocytes 6 Not Estab. %   Eos 0 Not Estab. %   Basos 1 Not Estab. %   Neutrophils Absolute 3.1 1.4 - 7.0 x10E3/uL   Lymphocytes Absolute 1.6 0.7 - 3.1 x10E3/uL   Monocytes Absolute 0.3 0.1 - 0.9 x10E3/uL   EOS (ABSOLUTE) 0.0 0.0 - 0.4 x10E3/uL   Basophils Absolute 0.0 0.0 - 0.2 x10E3/uL   Immature  Granulocytes 0 Not Estab. %   Immature Grans (Abs) 0.0 0.0 - 0.1 x10E3/uL  Comprehensive metabolic panel   Collection Time: 05/29/23  3:56 PM  Result Value Ref Range  Glucose 79 70 - 99 mg/dL   BUN 17 8 - 27 mg/dL   Creatinine, Ser 9.56 0.57 - 1.00 mg/dL   eGFR 93 >21 HY/QMV/7.84   BUN/Creatinine Ratio 24 12 - 28   Sodium 141 134 - 144 mmol/L   Potassium 3.9 3.5 - 5.2 mmol/L   Chloride 102 96 - 106 mmol/L   CO2 27 20 - 29 mmol/L   Calcium 9.4 8.7 - 10.3 mg/dL   Total Protein 6.6 6.0 - 8.5 g/dL   Albumin 4.5 3.9 - 4.9 g/dL   Globulin, Total 2.1 1.5 - 4.5 g/dL   Bilirubin Total 0.7 0.0 - 1.2 mg/dL   Alkaline Phosphatase 109 44 - 121 IU/L   AST 18 0 - 40 IU/L   ALT 13 0 - 32 IU/L  Lipid Panel w/o Chol/HDL Ratio   Collection Time: 05/29/23  3:56 PM  Result Value Ref Range   Cholesterol, Total 184 100 - 199 mg/dL   Triglycerides 696 0 - 149 mg/dL   HDL 86 >29 mg/dL   VLDL Cholesterol Cal 19 5 - 40 mg/dL   LDL Chol Calc (NIH) 79 0 - 99 mg/dL  VITAMIN D 25 Hydroxy (Vit-D Deficiency, Fractures)   Collection Time: 05/29/23  3:56 PM  Result Value Ref Range   Vit D, 25-Hydroxy 48.5 30.0 - 100.0 ng/mL  Iron Binding Cap (TIBC)([facility])   Collection Time: 05/29/23  3:56 PM  Result Value Ref Range   Total Iron Binding Capacity 270 250 - 450 ug/dL   UIBC 528 413 - 244 ug/dL   Iron 74 27 - 010 ug/dL   Iron Saturation 27 15 - 55 %  Ferritin   Collection Time: 05/29/23  3:56 PM  Result Value Ref Range   Ferritin 876 (H) 15 - 150 ng/mL  Vitamin B12   Collection Time: 05/29/23  3:56 PM  Result Value Ref Range   Vitamin B-12 986 232 - 1,245 pg/mL  Magnesium   Collection Time: 05/29/23  3:56 PM  Result Value Ref Range   Magnesium 2.1 1.6 - 2.3 mg/dL      Assessment & Plan:   Problem List Items Addressed This Visit       Endocrine   Hashimoto's thyroiditis   Chronic, stable.  Continue current medication regimen and adjust as needed.  Thyroid ultrasound completed and  overall reassuring.  Continue collaboration with endo.      Relevant Orders   TSH   T4, free     Nervous and Auditory   Neuropathy - Primary   Ongoing.  Post hospitalization with septic shock and is ongoing issue.  Will continue Gabapentin 300 MG TID which is offering some benefit.  Scheduled to see neurology in March.         Other   Elevated ferritin (Chronic)   Ongoing since sepsis almost 2 years ago.  Last level in 800 range.  Recheck CBC and ferritin today.  Return to hematology as needed.      Relevant Orders   CBC with Differential/Platelet   Ferritin     Follow up plan: Return in about 3 months (around 11/28/2023) for THYROID, NEUROPATHY, MOOD, OSTEOPOROSIS.

## 2023-09-07 NOTE — Assessment & Plan Note (Signed)
Ongoing.  Post hospitalization with septic shock and is ongoing issue.  Will continue Gabapentin 300 MG TID which is offering some benefit.  Scheduled to see neurology in March.

## 2023-09-08 ENCOUNTER — Encounter: Payer: Self-pay | Admitting: Nurse Practitioner

## 2023-09-08 LAB — CBC WITH DIFFERENTIAL/PLATELET
Basophils Absolute: 0 10*3/uL (ref 0.0–0.2)
Basos: 1 %
EOS (ABSOLUTE): 0 10*3/uL (ref 0.0–0.4)
Eos: 1 %
Hematocrit: 34.4 % (ref 34.0–46.6)
Hemoglobin: 11.4 g/dL (ref 11.1–15.9)
Immature Grans (Abs): 0 10*3/uL (ref 0.0–0.1)
Immature Granulocytes: 0 %
Lymphocytes Absolute: 1.4 10*3/uL (ref 0.7–3.1)
Lymphs: 32 %
MCH: 29.7 pg (ref 26.6–33.0)
MCHC: 33.1 g/dL (ref 31.5–35.7)
MCV: 90 fL (ref 79–97)
Monocytes Absolute: 0.3 10*3/uL (ref 0.1–0.9)
Monocytes: 6 %
Neutrophils Absolute: 2.6 10*3/uL (ref 1.4–7.0)
Neutrophils: 60 %
Platelets: 148 10*3/uL — ABNORMAL LOW (ref 150–450)
RBC: 3.84 x10E6/uL (ref 3.77–5.28)
RDW: 12.2 % (ref 11.7–15.4)
WBC: 4.2 10*3/uL (ref 3.4–10.8)

## 2023-09-08 LAB — T4, FREE: Free T4: 1.54 ng/dL (ref 0.82–1.77)

## 2023-09-08 LAB — FERRITIN: Ferritin: 736 ng/mL — ABNORMAL HIGH (ref 15–150)

## 2023-09-08 LAB — TSH: TSH: 0.321 u[IU]/mL — ABNORMAL LOW (ref 0.450–4.500)

## 2023-09-08 NOTE — Progress Notes (Signed)
Contacted via MyChart   Good morning Estefania, your labs have returned: - CBC is showing no infection or anemia.  Platelet count is very mildly low, we will continue to monitor this. - Thyroid levels are showing lower TSH and normal Free T4. I will send these to endocrinology as they may want to lower medication dose. - Ferritin level is still elevated, but is trending down.  This elevation can be related to many things including inflammation, even at times thyroid disease.  Any questions? Keep being amazing!!  Thank you for allowing me to participate in your care.  I appreciate you. Kindest regards, Aidah Forquer

## 2023-09-08 NOTE — Progress Notes (Signed)
I obtained thyroid levels for next visit per patient request.  I told her I would send your way:)

## 2023-09-14 ENCOUNTER — Encounter: Payer: Self-pay | Admitting: Nurse Practitioner

## 2023-09-27 ENCOUNTER — Ambulatory Visit: Payer: Self-pay | Admitting: Dermatology

## 2023-10-23 ENCOUNTER — Encounter: Payer: Self-pay | Admitting: Nurse Practitioner

## 2023-10-23 DIAGNOSIS — E063 Autoimmune thyroiditis: Secondary | ICD-10-CM

## 2023-10-25 ENCOUNTER — Other Ambulatory Visit

## 2023-10-25 DIAGNOSIS — E063 Autoimmune thyroiditis: Secondary | ICD-10-CM

## 2023-10-25 NOTE — Telephone Encounter (Signed)
 Lab appt scheduled.

## 2023-10-26 ENCOUNTER — Encounter: Payer: Self-pay | Admitting: Nurse Practitioner

## 2023-10-26 LAB — T4, FREE: Free T4: 1.22 ng/dL (ref 0.82–1.77)

## 2023-10-26 LAB — TSH: TSH: 0.756 u[IU]/mL (ref 0.450–4.500)

## 2023-10-26 NOTE — Progress Notes (Signed)
 Contacted via MyChart   Labs look good, I will send to [facility]:)

## 2023-10-26 NOTE — Progress Notes (Signed)
 Thank you :)

## 2023-10-30 ENCOUNTER — Encounter: Payer: Self-pay | Admitting: Dermatology

## 2023-10-30 ENCOUNTER — Ambulatory Visit: Payer: Self-pay | Admitting: Dermatology

## 2023-10-30 DIAGNOSIS — D492 Neoplasm of unspecified behavior of bone, soft tissue, and skin: Secondary | ICD-10-CM

## 2023-10-30 DIAGNOSIS — W908XXA Exposure to other nonionizing radiation, initial encounter: Secondary | ICD-10-CM | POA: Diagnosis not present

## 2023-10-30 DIAGNOSIS — L578 Other skin changes due to chronic exposure to nonionizing radiation: Secondary | ICD-10-CM | POA: Diagnosis not present

## 2023-10-30 DIAGNOSIS — L814 Other melanin hyperpigmentation: Secondary | ICD-10-CM

## 2023-10-30 DIAGNOSIS — L570 Actinic keratosis: Secondary | ICD-10-CM | POA: Diagnosis not present

## 2023-10-30 DIAGNOSIS — D1801 Hemangioma of skin and subcutaneous tissue: Secondary | ICD-10-CM

## 2023-10-30 DIAGNOSIS — D2361 Other benign neoplasm of skin of right upper limb, including shoulder: Secondary | ICD-10-CM

## 2023-10-30 DIAGNOSIS — Z1283 Encounter for screening for malignant neoplasm of skin: Secondary | ICD-10-CM

## 2023-10-30 DIAGNOSIS — D229 Melanocytic nevi, unspecified: Secondary | ICD-10-CM

## 2023-10-30 DIAGNOSIS — D485 Neoplasm of uncertain behavior of skin: Secondary | ICD-10-CM

## 2023-10-30 DIAGNOSIS — Z808 Family history of malignant neoplasm of other organs or systems: Secondary | ICD-10-CM

## 2023-10-30 DIAGNOSIS — L821 Other seborrheic keratosis: Secondary | ICD-10-CM

## 2023-10-30 NOTE — Patient Instructions (Addendum)

## 2023-10-30 NOTE — Progress Notes (Signed)
 New Patient Visit   Subjective  [name] Robinson is a 73 y.o. female who presents for the following: Skin Cancer Screening and Upper Body Skin Exam  The patient presents for Upper Body Skin Exam (UBSE) for skin cancer screening and mole check. The patient has spots, moles and lesions to be evaluated, some may be new or changing and the patient may have concern these could be cancer.  Pt has an irregular skin lesion on the back that she would like checked today. Her husband and daughter noticed it and was concerned.  The following portions of the chart were reviewed this encounter and updated as appropriate: medications, allergies, medical history  Review of Systems:  No other skin or systemic complaints except as noted in HPI or Assessment and Plan.  Objective  Well appearing patient in no apparent distress; mood and affect are within normal limits.  All skin waist up examined. Relevant physical exam findings are noted in the Assessment and Plan.  Right Upper Arm 1.3 cm indurated plaque with overlying hyperpigmentation.  Right Chest 0.7 cm pink scaly macule.   Assessment & Plan   NEOPLASM OF UNCERTAIN BEHAVIOR OF SKIN (2) Right Upper Arm Skin / nail biopsy Type of biopsy: tangential   Informed consent: discussed and consent obtained   Timeout: patient name, date of birth, surgical site, and procedure verified   Procedure prep:  Patient was prepped and draped in usual sterile fashion Prep type:  Isopropyl alcohol Anesthesia: the lesion was anesthetized in a standard fashion   Anesthetic:  1% lidocaine w/ epinephrine 1-100,000 buffered w/ 8.4% NaHCO3 Instrument used: DermaBlade   Hemostasis achieved with: pressure and aluminum chloride   Outcome: patient tolerated procedure well   Post-procedure details: sterile dressing applied and wound care instructions given   Dressing type: bandage and petrolatum   Specimen 2 - Surgical pathology Differential Diagnosis: dermatofibroma  vs melanoma Check Margins: No Right Chest Skin / nail biopsy Type of biopsy: tangential   Informed consent: discussed and consent obtained   Timeout: patient name, date of birth, surgical site, and procedure verified   Procedure prep:  Patient was prepped and draped in usual sterile fashion Prep type:  Isopropyl alcohol Anesthesia: the lesion was anesthetized in a standard fashion   Anesthetic:  1% lidocaine w/ epinephrine 1-100,000 buffered w/ 8.4% NaHCO3 Instrument used: DermaBlade   Hemostasis achieved with: pressure and aluminum chloride   Outcome: patient tolerated procedure well   Post-procedure details: sterile dressing applied and wound care instructions given   Dressing type: bandage and petrolatum   Specimen 1 - Surgical pathology Differential Diagnosis: AK vs SCC vs BCC Check Margins: No ACTINIC ELASTOSIS   MULTIPLE BENIGN NEVI   LENTIGINES   SEBORRHEIC KERATOSES   CHERRY ANGIOMA    Actinic Damage - Chronic condition, secondary to cumulative UV/sun exposure - diffuse scaly erythematous macules with underlying dyspigmentation - Recommend daily broad spectrum sunscreen SPF 30+ to sun-exposed areas, reapply every 2 hours as needed.  - Staying in the shade or wearing long sleeves, sun glasses (UVA+UVB protection) and wide brim hats (4-inch brim around the entire circumference of the hat) are also recommended for sun protection.  - Call for new or changing lesions.  Lentigines, Seborrheic Keratoses, Hemangiomas - Benign normal skin lesions - Benign-appearing - Call for any changes  Melanocytic Nevi - Tan-brown and/or pink-flesh-colored symmetric macules and papules - Benign appearing on exam today - Observation - Call clinic for new or changing moles - Recommend daily  use of broad spectrum spf 30+ sunscreen to sun-exposed areas.   FAMILY HISTORY OF SKIN CANCER What type(s):unknown Who affected:brother and sister  Skin cancer screening performed  today.  Return in about 1 year (around 10/29/2024) for TBSE.  Maylene Roes, CMA, am acting as scribe for Elie Goody, MD .  Documentation: I have reviewed the above documentation for accuracy and completeness, and I agree with the above.  Elie Goody, MD

## 2023-10-30 NOTE — Patient Instructions (Signed)

## 2023-10-31 ENCOUNTER — Encounter: Payer: Self-pay | Admitting: Nurse Practitioner

## 2023-10-31 ENCOUNTER — Ambulatory Visit (INDEPENDENT_AMBULATORY_CARE_PROVIDER_SITE_OTHER): Payer: Medicare HMO | Admitting: Nurse Practitioner

## 2023-10-31 VITALS — BP 128/68 | HR 64 | Ht 67.0 in | Wt 187.2 lb

## 2023-10-31 DIAGNOSIS — E063 Autoimmune thyroiditis: Secondary | ICD-10-CM | POA: Diagnosis not present

## 2023-10-31 MED ORDER — LEVOTHYROXINE SODIUM 88 MCG PO TABS
88.0000 ug | ORAL_TABLET | Freq: Every day | ORAL | 3 refills | Status: AC
Start: 2023-10-31 — End: ?

## 2023-10-31 NOTE — Progress Notes (Signed)
 Endocrinology Follow Up Note                                         10/31/2023, 1:34 PM  Subjective:   Subjective    [name] Robinson is a 73 y.o.-year-old female patient being seen in follow up after being seen in consultation for hypothyroidism referred by Marjie Skiff, NP.   Past Medical History:  Diagnosis Date   Hypothyroidism    Morbid obesity (HCC) 11/2019   s/p Barriatric Sgx ([facility])   Sleep apnea    doesn't use CPAP, lost weight, not needed    Past Surgical History:  Procedure Laterality Date   ABDOMINAL HYSTERECTOMY     BARIATRIC SURGERY N/A 01/02/2020   duodenal switch   CERVICAL DISC SURGERY N/A    discectomy   COLONOSCOPY     ESOPHAGOGASTRODUODENOSCOPY  11/06/2020   EYE SURGERY Bilateral    laser procedure   IR RADIOLOGIST EVAL & MGMT  04/05/2023   LAPAROSCOPIC COLON RESECTION     MASS EXCISION Left 04/13/2021   Procedure: EXCISION MASS, left lateral thigh;  Surgeon: Campbell Lerner, MD;  Location: [facility] ORS;  Service: General;  Laterality: Left;   SHOULDER SURGERY Left    torn cartilege   TONSILLECTOMY N/A    TOTAL HIP ARTHROPLASTY Right 11/02/2020   TRANSTHORACIC ECHOCARDIOGRAM  07/14/2019   a) [facility]) Technically difficult.-Used Definity.  Nl LV Fxn -EF 55 - 60%.  No obvious WMA.  Mild MR.  No effusion;; b) Echo 04/16/2021-[facility]: (Fair quality) normal LV size and function.  EF 55-60%.  No R WMA.  Normal diastolic parameters.  Mild MR.  Unable to assess RVSP.    Social History   Socioeconomic History   Marital status: Married    Spouse name: Maurine Minister   Number of children: 2   Years of education: Not on file   Highest education level: Some college, no degree  Occupational History   Occupation: retired  Tobacco Use   Smoking status: Never   Smokeless tobacco: Never  Vaping Use   Vaping status: Never Used  Substance and Sexual Activity   Alcohol use: Never   Drug  use: Never   Sexual activity: Not Currently  Other Topics Concern   Not on file  Social History Narrative   04/20/22 She is retired, lives with husband.    Otherwise, she is usually on the go, not sedentary.   Social Drivers of Health   Financial Resource Strain: Medium Risk (06/12/2023)   Overall Financial Resource Strain (CARDIA)    Difficulty of Paying Living Expenses: Somewhat hard  Food Insecurity: No Food Insecurity (06/12/2023)   Hunger Vital Sign    Worried About Running Out of Food in the Last Year: Never true    Ran Out of Food in the Last Year: Never true  Transportation Needs: No Transportation Needs (06/12/2023)   PRAPARE - Administrator, Civil Service (Medical): No    Lack of Transportation (Non-Medical): No  Physical Activity:  Insufficiently Active (06/12/2023)   Exercise Vital Sign    Days of Exercise per Week: 7 days    Minutes of Exercise per Session: 20 min  Stress: No Stress Concern Present (06/12/2023)   [employer] of Occupational Health - Occupational Stress Questionnaire    Feeling of Stress : Only a little  Recent Concern: Stress - Stress Concern Present (05/29/2023)   [employer] of Occupational Health - Occupational Stress Questionnaire    Feeling of Stress : To some extent  Social Connections: Socially Integrated (06/12/2023)   Social Connection and Isolation Panel [NHANES]    Frequency of Communication with Friends and Family: More than three times a week    Frequency of Social Gatherings with Friends and Family: Three times a week    Attends Religious Services: More than 4 times per year    Active Member of Clubs or Organizations: Yes    Attends Engineer, structural: More than 4 times per year    Marital Status: Married    Family History  Problem Relation Age of Onset   Thyroid disease Mother    Cancer - Lung Father    Cancer - Lung Brother    Cancer Brother     Outpatient Encounter Medications as of  10/31/2023  Medication Sig   alendronate (FOSAMAX) 70 MG tablet Take 1 tablet (70 mg total) by mouth every 7 (seven) days. Take with a full glass of water on an empty stomach.   busPIRone (BUSPAR) 15 MG tablet Take 1 tablet (15 mg total) by mouth 2 (two) times daily.   citalopram (CELEXA) 20 MG tablet TAKE 1 TABLET BY MOUTH EVERY DAY   gabapentin (NEURONTIN) 300 MG capsule Take 1 capsule (300 mg total) by mouth 3 (three) times daily.   traZODone (DESYREL) 50 MG tablet TAKE 2 TABLETS BY MOUTH AT BEDTIME AS NEEDED.   vitamin B-12 (CYANOCOBALAMIN) 1000 MCG tablet Take 1,000 mcg by mouth once a week.   Vitamin D, Ergocalciferol, (DRISDOL) 1.25 MG (50000 UNIT) CAPS capsule TAKE 1 CAPSULE BY MOUTH ONE TIME PER WEEK   [DISCONTINUED] levothyroxine (SYNTHROID) 88 MCG tablet Take 1 tablet (88 mcg total) by mouth daily before breakfast.   levothyroxine (SYNTHROID) 88 MCG tablet Take 1 tablet (88 mcg total) by mouth daily before breakfast.   No facility-administered encounter medications on file as of 10/31/2023.    ALLERGIES: Allergies  Allergen Reactions   Solifenacin Rash    Patient had bad rash with this med.   VACCINATION STATUS: Immunization History  Administered Date(s) Administered   Fluad Quad(high Dose 65+) 05/20/2021, 05/29/2022   Fluad Trivalent(High Dose 65+) 05/17/2018, 05/29/2023   Influenza Inj Mdck Quad Pf 05/29/2017   Influenza, High Dose Seasonal PF 05/17/2018, 05/07/2019   Influenza,inj,Quad PF,6+ Mos 06/18/2015, 05/19/2016, 05/07/2019   Influenza-Unspecified 05/29/2017, 06/16/2020   PFIZER(Purple Top)SARS-COV-2 Vaccination 10/11/2019, 11/05/2019   PNEUMOCOCCAL CONJUGATE-20 05/29/2023   PPD Test 01/04/2021   Pneumococcal Conjugate-13 05/07/2019   Td 02/28/2021   Tdap 06/21/2003     HPI   [name] Robinson  is a patient with the above medical history. she was diagnosed with hypothyroidism years ago, which required subsequent initiation of thyroid hormone replacement. she  was given various doses of Levothyroxine over the years, currently on 25 micrograms. she reports compliance to this medication:  Taking it daily on empty stomach  with water, separated by >30 minutes before breakfast and other medications , and by at least 4 hours from calcium, iron, PPIs, multivitamins.  She  notes she was hospitalized for sepsis last year and has since developed a constant tremor in which she has been trying to solve.  She weaned herself off Fentanyl and that did not help her tremors.  I reviewed patient's thyroid tests:  Lab Results  Component Value Date   TSH 0.756 10/25/2023   TSH 0.321 (L) 09/07/2023   TSH 1.300 03/14/2023   TSH 0.823 01/16/2023   TSH 1.790 11/08/2022   TSH 3.770 08/08/2022   TSH 14.500 (H) 06/23/2022   TSH 12.500 (H) 04/07/2022   TSH 3.590 02/15/2022   TSH 0.056 (L) 12/14/2021   FREET4 1.22 10/25/2023   FREET4 1.54 09/07/2023   FREET4 1.34 03/14/2023   FREET4 1.23 01/16/2023   FREET4 1.30 11/08/2022   FREET4 1.28 08/08/2022   FREET4 0.94 06/23/2022   FREET4 0.62 (L) 04/07/2022   FREET4 1.00 02/15/2022   FREET4 1.84 (H) 12/14/2021    Pt denies feeling nodules in neck, hoarseness, dysphagia/odynophagia, SOB with lying down.  she denies family history of thyroid disorders.  No family history of thyroid cancer.  No history of radiation therapy to head or neck.  No recent use of iodine supplements.  Denies use of Biotin containing supplements.  I reviewed her chart and she also has a history of neuropathy, vitamin d deficiency, cardiomyopathy, pernicious anemia, bariatric surgery.   ROS:  Constitutional: + weight gain, + fatigue, no subjective hyperthermia Eyes: no blurry vision, no xerophthalmia ENT: no sore throat, no nodules palpated in throat, no dysphagia/odynophagia, no hoarseness Cardiovascular: no chest pain, no SOB, no palpitations, no leg swelling Respiratory: no cough, no SOB Gastrointestinal: no  nausea/vomiting/diarrhea Musculoskeletal: + diffuse muscle/joint aches Skin: no rashes Neurological:  no numbness, no tingling, no dizziness Psychiatric: no depression, no anxiety   Objective:   Objective     BP 128/68 (BP Location: Right Arm, Patient Position: Sitting, Cuff Size: Large)   Pulse 64   Ht 5\' 7"  (1.702 m)   Wt 187 lb 3.2 oz (84.9 kg)   BMI 29.32 kg/m  Wt Readings from Last 3 Encounters:  10/31/23 187 lb 3.2 oz (84.9 kg)  09/07/23 185 lb 9.6 oz (84.2 kg)  08/20/23 183 lb 12.8 oz (83.4 kg)    BP Readings from Last 3 Encounters:  10/31/23 128/68  09/07/23 124/71  08/20/23 114/74      Physical Exam- Limited  Constitutional:  Body mass index is 29.32 kg/m. , not in acute distress, normal state of mind Eyes:  EOMI, no exophthalmos Musculoskeletal: no gross deformities, strength intact in all four extremities, no gross restriction of joint movements Skin:  no rashes, no hyperemia Neurological: no tremor with outstretched hands   CMP ( most recent) CMP     Component Value Date/Time   NA 141 05/29/2023 1556   K 3.9 05/29/2023 1556   CL 102 05/29/2023 1556   CO2 27 05/29/2023 1556   GLUCOSE 79 05/29/2023 1556   GLUCOSE 156 (H) 06/10/2021 0615   BUN 17 05/29/2023 1556   CREATININE 0.70 05/29/2023 1556   CALCIUM 9.4 05/29/2023 1556   PROT 6.6 05/29/2023 1556   ALBUMIN 4.5 05/29/2023 1556   AST 18 05/29/2023 1556   ALT 13 05/29/2023 1556   ALKPHOS 109 05/29/2023 1556   BILITOT 0.7 05/29/2023 1556   GFRNONAA >60 06/10/2021 0615     Diabetic Labs (most recent): No results found for: "HGBA1C", "MICROALBUR"   Lipid Panel ( most recent) Lipid Panel     Component Value Date/Time  CHOL 184 05/29/2023 1556   TRIG 109 05/29/2023 1556   HDL 86 05/29/2023 1556   LDLCALC 79 05/29/2023 1556   LABVLDL 19 05/29/2023 1556       Lab Results  Component Value Date   TSH 0.756 10/25/2023   TSH 0.321 (L) 09/07/2023   TSH 1.300 03/14/2023   TSH 0.823  01/16/2023   TSH 1.790 11/08/2022   TSH 3.770 08/08/2022   TSH 14.500 (H) 06/23/2022   TSH 12.500 (H) 04/07/2022   TSH 3.590 02/15/2022   TSH 0.056 (L) 12/14/2021   FREET4 1.22 10/25/2023   FREET4 1.54 09/07/2023   FREET4 1.34 03/14/2023   FREET4 1.23 01/16/2023   FREET4 1.30 11/08/2022   FREET4 1.28 08/08/2022   FREET4 0.94 06/23/2022   FREET4 0.62 (L) 04/07/2022   FREET4 1.00 02/15/2022   FREET4 1.84 (H) 12/14/2021     Thyroid US from 02/23/22 CLINICAL DATA:  Other.  Hashimoto's thyroiditis   EXAM: THYROID ULTRASOUND   TECHNIQUE: Ultrasound examination of the thyroid gland and adjacent soft tissues was performed.   COMPARISON:  None Available.   FINDINGS: Parenchymal Echotexture: Markedly heterogenous   Isthmus: 0.2 cm   Right lobe: 5.4 x 3.1 x 2.6 cm   Left lobe: 5.8 x 3.2 x 2.4 cm   _________________________________________________________   Estimated total number of nodules >/= 1 cm: 0   Number of spongiform nodules >/=  2 cm not described below (TR1): 0   Number of mixed cystic and solid nodules >/= 1.5 cm not described below (TR2): 0   _________________________________________________________   No discrete nodules are seen within the thyroid gland.   IMPRESSION: 1. Diffusely enlarged and heterogeneous thyroid gland. The imaging appearance is consistent with the clinical history of Hashimoto's thyroiditis. 2. No discrete thyroid nodules are visualized.     Electronically Signed   By: Malachy Moan M.D.   On: 02/24/2022 16:12   Latest Reference Range & Units 08/08/22 09:06 11/08/22 10:20 01/16/23 13:24 03/14/23 10:30 09/07/23 14:16 10/25/23 14:33  TSH 0.450 - 4.500 uIU/mL 3.770 1.790 0.823 1.300 0.321 (L) 0.756  Triiodothyronine,Free,Serum 2.0 - 4.4 pg/mL    2.5    T4,Free(Direct) 0.82 - 1.77 ng/dL 6.29 5.28 4.13 2.44 0.10 1.22  (L): Data is abnormally low  Assessment & Plan:   ASSESSMENT / PLAN:  1. Hypothyroidism-r/t Hashimotos  thyroiditis.   Patient with long-standing hypothyroidism, on levothyroxine therapy. On physical exam , patient  does not  have  gross goiter, thyroid nodules, or neck compression symptoms.  Positive antibody testing confirms suspicion of autoimmune thyroid disease.  Her previsit thyroid function tests are consistent with appropriate hormone replacement.  She is advised to continue her dose of Levothyroxine 88 mcg po daily before breakfast.  - We discussed about correct intake of levothyroxine, at fasting, with water, separated by at least 30 minutes from breakfast, and separated by more than 4 hours from calcium, iron, multivitamins, acid reflux medications (PPIs). -Patient is made aware of the fact that thyroid hormone replacement is needed for life, dose to be adjusted by periodic monitoring of thyroid function tests.  -She recently had thyroid US on 02/23/22 which showed enlarged thyroid with heterogeneous tissue and no nodularity.    I spent  12  minutes in the care of the patient today including review of labs from Thyroid Function, CMP, and other relevant labs ; imaging/biopsy records (current and previous including abstractions from other facilities); face-to-face time discussing  her lab results and symptoms, medications doses,  her options of short and long term treatment based on the latest standards of care / guidelines;   and documenting the encounter.  [name]  participated in the discussions, expressed understanding, and voiced agreement with the above plans.  All questions were answered to her satisfaction. she is encouraged to contact clinic should she have any questions or concerns prior to her return visit.   FOLLOW UP PLAN:  Return in about 1 year (around 10/30/2024) for Thyroid follow up, Previsit labs.  Ronny Bacon, [facility] [address] [place], [place] [address] Phone: [phone] Fax: [phone]  10/31/2023, 1:34  PM

## 2023-11-02 LAB — SURGICAL PATHOLOGY

## 2023-11-05 ENCOUNTER — Encounter: Payer: Self-pay | Admitting: Dermatology

## 2023-12-11 ENCOUNTER — Other Ambulatory Visit: Payer: Self-pay | Admitting: Nurse Practitioner

## 2023-12-13 NOTE — Telephone Encounter (Signed)
 Last OV 09/07/23 Requested Prescriptions  Pending Prescriptions Disp Refills   busPIRone  (BUSPAR ) 15 MG tablet [Pharmacy Med Name: BUSPIRONE  HCL 15 MG TABLET] 180 tablet 4    Sig: TAKE 1 TABLET BY MOUTH 2 TIMES DAILY.     Psychiatry: Anxiolytics/Hypnotics - Non-controlled Failed - 12/13/2023 11:13 AM      Failed - Valid encounter within last 12 months    Recent Outpatient Visits   None     Future Appointments             In 10 months Harris Liming, MD [facility]

## 2024-02-04 ENCOUNTER — Encounter: Payer: Self-pay | Admitting: Nurse Practitioner

## 2024-04-18 ENCOUNTER — Other Ambulatory Visit: Payer: Self-pay | Admitting: Nurse Practitioner

## 2024-04-18 NOTE — Telephone Encounter (Signed)
 OFFICE VISIT NEEDED FOR ADDITIONAL REFILLS Courtesy refill given.  Requested Prescriptions  Pending Prescriptions Disp Refills   traZODone  (DESYREL ) 50 MG tablet [Pharmacy Med Name: TRAZODONE  50 MG TABLET] 60 tablet 0    Sig: TAKE 2 TABLETS BY MOUTH AT BEDTIME AS NEEDED     Psychiatry: Antidepressants - Serotonin Modulator Failed - 04/18/2024 10:00 AM      Failed - Valid encounter within last 6 months    Recent Outpatient Visits   None     Future Appointments             In 6 months Claudene Lehmann, MD [facility]

## 2024-04-22 ENCOUNTER — Ambulatory Visit: Admitting: Nurse Practitioner

## 2024-05-11 ENCOUNTER — Other Ambulatory Visit: Payer: Self-pay | Admitting: Nurse Practitioner

## 2024-05-13 NOTE — Telephone Encounter (Signed)
 Requested medication (s) are due for refill today: yes  Requested medication (s) are on the active medication list: yes  Last refill:  04/18/24 #60 tab  Future visit scheduled: yes  Notes to clinic:  pharmacy requesting 90 day refills   Requested Prescriptions  Pending Prescriptions Disp Refills   traZODone  (DESYREL ) 50 MG tablet [Pharmacy Med Name: TRAZODONE  50 MG TABLET] 180 tablet 1    Sig: TAKE 2 TABLETS BY MOUTH AT BEDTIME AS NEEDED     Psychiatry: Antidepressants - Serotonin Modulator Failed - 05/13/2024  1:47 PM      Failed - Valid encounter within last 6 months    Recent Outpatient Visits   None     Future Appointments             In 5 months Claudene Lehmann, MD [facility]

## 2024-05-20 ENCOUNTER — Other Ambulatory Visit: Payer: Self-pay | Admitting: Nurse Practitioner

## 2024-05-20 DIAGNOSIS — Z1231 Encounter for screening mammogram for malignant neoplasm of breast: Secondary | ICD-10-CM

## 2024-05-30 ENCOUNTER — Other Ambulatory Visit: Payer: Self-pay | Admitting: Nurse Practitioner

## 2024-06-01 NOTE — Patient Instructions (Signed)
 Be Involved in Caring For Your Health:  Taking Medications When medications are taken as directed, they can greatly improve your health. But if they are not taken as prescribed, they may not work. In some cases, not taking them correctly can be harmful. To help ensure your treatment remains effective and safe, understand your medications and how to take them. Bring your medications to each visit for review by your provider.  Your lab results, notes, and after visit summary will be available on My Chart. We strongly encourage you to use this feature. If lab results are abnormal the clinic will contact you with the appropriate steps. If the clinic does not contact you assume the results are satisfactory. You can always view your results on My Chart. If you have questions regarding your health or results, please contact the clinic during office hours. You can also ask questions on My Chart.  We at [facility] are grateful that you chose us  to provide your care. We strive to provide evidence-based and compassionate care and are always looking for feedback. If you get a survey from the clinic please complete this so we can hear your opinions.  Healthy Eating, Adult Healthy eating may help you get and keep a healthy body weight, reduce the risk of chronic disease, and live a long and productive life. It is important to follow a healthy eating pattern. Your nutritional and calorie needs should be met mainly by different nutrient-rich foods. What are tips for following this plan? Reading food labels Read labels and choose the following: Reduced or low sodium products. Juices with 100% fruit juice. Foods with low saturated fats (<3 g per serving) and high polyunsaturated and monounsaturated fats. Foods with whole grains, such as whole wheat, cracked wheat, brown rice, and wild rice. Whole grains that are fortified with folic acid. This is recommended for females who are pregnant or who want to  become pregnant. Read labels and do not eat or drink the following: Foods or drinks with added sugars. These include foods that contain brown sugar, corn sweetener, corn syrup, dextrose , fructose, glucose, high-fructose corn syrup, honey, invert sugar, lactose, malt syrup, maltose, molasses, raw sugar, sucrose, trehalose, or turbinado sugar. Limit your intake of added sugars to less than 10% of your total daily calories. Do not eat more than the following amounts of added sugar per day: 6 teaspoons (25 g) for females. 9 teaspoons (38 g) for males. Foods that contain processed or refined starches and grains. Refined grain products, such as white flour, degermed cornmeal, white bread, and white rice. Shopping Choose nutrient-rich snacks, such as vegetables, whole fruits, and nuts. Avoid high-calorie and high-sugar snacks, such as potato chips, fruit snacks, and candy. Use oil-based dressings and spreads on foods instead of solid fats such as butter, margarine, sour cream, or cream cheese. Limit pre-made sauces, mixes, and instant products such as flavored rice, instant noodles, and ready-made pasta. Try more plant-protein sources, such as tofu, tempeh, black beans, edamame, lentils, nuts, and seeds. Explore eating plans such as the Mediterranean diet or vegetarian diet. Try heart-healthy dips made with beans and healthy fats like hummus and guacamole. Vegetables go great with these. Cooking Use oil to saut or stir-fry foods instead of solid fats such as butter, margarine, or lard. Try baking, boiling, grilling, or broiling instead of frying. Remove the fatty part of meats before cooking. Steam vegetables in water  or broth. Meal planning  At meals, imagine dividing your plate into fourths: One-half of  your plate is fruits and vegetables. One-fourth of your plate is whole grains. One-fourth of your plate is protein, especially lean meats, poultry, eggs, tofu, beans, or nuts. Include low-fat  dairy as part of your daily diet. Lifestyle Choose healthy options in all settings, including home, work, school, restaurants, or stores. Prepare your food safely: Wash your hands after handling raw meats. Where you prepare food, keep surfaces clean by regularly washing with hot, soapy water . Keep raw meats separate from ready-to-eat foods, such as fruits and vegetables. Cook seafood, meat, poultry, and eggs to the recommended temperature. Get a food thermometer. Store foods at safe temperatures. In general: Keep cold foods at 84F (4.4C) or below. Keep hot foods at 184F (60C) or above. Keep your freezer at [facility] (-17.8C) or below. Foods are not safe to eat if they have been between the temperatures of 40-184F (4.4-60C) for more than 2 hours. What foods should I eat? Fruits Aim to eat 1-2 cups of fresh, canned (in natural juice), or frozen fruits each day. One cup of fruit equals 1 small apple, 1 large banana, 8 large strawberries, 1 cup (237 g) canned fruit,  cup (82 g) dried fruit, or 1 cup (240 mL) 100% juice. Vegetables Aim to eat 2-4 cups of fresh and frozen vegetables each day, including different varieties and colors. One cup of vegetables equals 1 cup (91 g) broccoli or cauliflower florets, 2 medium carrots, 2 cups (150 g) raw, leafy greens, 1 large tomato, 1 large bell pepper, 1 large sweet potato, or 1 medium white potato. Grains Aim to eat 5-10 ounce-equivalents of whole grains each day. Examples of 1 ounce-equivalent of grains include 1 slice of bread, 1 cup (40 g) ready-to-eat cereal, 3 cups (24 g) popcorn, or  cup (93 g) cooked rice. Meats and other proteins Try to eat 5-7 ounce-equivalents of protein each day. Examples of 1 ounce-equivalent of protein include 1 egg,  oz nuts (12 almonds, 24 pistachios, or 7 walnut halves), 1/4 cup (90 g) cooked beans, 6 tablespoons (90 g) hummus or 1 tablespoon (16 g) peanut butter. A cut of meat or fish that is the size of a deck of  cards is about 3-4 ounce-equivalents (85 g). Of the protein you eat each week, try to have at least 8 sounce (227 g) of seafood. This is about 2 servings per week. This includes salmon, trout, herring, sardines, and anchovies. Dairy Aim to eat 3 cup-equivalents of fat-free or low-fat dairy each day. Examples of 1 cup-equivalent of dairy include 1 cup (240 mL) milk, 8 ounces (250 g) yogurt, 1 ounces (44 g) natural cheese, or 1 cup (240 mL) fortified soy milk. Fats and oils Aim for about 5 teaspoons (21 g) of fats and oils per day. Choose monounsaturated fats, such as canola and olive oils, mayonnaise made with olive oil or avocado oil, avocados, peanut butter, and most nuts, or polyunsaturated fats, such as sunflower, corn, and soybean oils, walnuts, pine nuts, sesame seeds, sunflower seeds, and flaxseed. Beverages Aim for 6 eight-ounce glasses of water  per day. Limit coffee to 3-5 eight-ounce cups per day. Limit caffeinated beverages that have added calories, such as soda and energy drinks. If you drink alcohol: Limit how much you have to: 0-1 drink a day if you are female. 0-2 drinks a day if you are female. Know how much alcohol is in your drink. In the [place] one drink is one 12 oz bottle of beer (355 mL), one 5 oz glass of wine (  148 mL), or one 1 oz glass of hard liquor (44 mL). Seasoning and other foods Try not to add too much salt to your food. Try using herbs and spices instead of salt. Try not to add sugar to food. This information is based on [place] nutrition guidelines. To learn more, visit Dis[employer].be. Exact amounts may vary. You may need different amounts. This information is not intended to replace advice given to you by your health care provider. Make sure you discuss any questions you have with your health care provider. Document Revised: 05/01/2022 Document Reviewed: 05/01/2022 Elsevier Patient Education  2024 [employer].

## 2024-06-02 NOTE — Telephone Encounter (Signed)
 Too soon for refill, [facility] 12/13/23 for 90 and 1.OFFICE VISIT NEEDED FOR ADDITIONAL REFILLS.  Requested Prescriptions  Pending Prescriptions Disp Refills   busPIRone  (BUSPAR ) 15 MG tablet [Pharmacy Med Name: BUSPIRONE  HCL 15 MG TABLET] 180 tablet 1    Sig: TAKE 1 TABLET BY MOUTH TWICE A DAY     Psychiatry: Anxiolytics/Hypnotics - Non-controlled Failed - 06/02/2024 10:54 AM      Failed - Valid encounter within last 12 months    Recent Outpatient Visits   None     Future Appointments             In 5 months Claudene Lehmann, MD [facility]

## 2024-06-03 ENCOUNTER — Ambulatory Visit (INDEPENDENT_AMBULATORY_CARE_PROVIDER_SITE_OTHER): Admitting: Nurse Practitioner

## 2024-06-03 ENCOUNTER — Encounter: Payer: Self-pay | Admitting: Nurse Practitioner

## 2024-06-03 VITALS — BP 126/76 | HR 51 | Temp 98.1°F | Resp 14 | Ht 67.01 in | Wt 202.8 lb

## 2024-06-03 DIAGNOSIS — Z13220 Encounter for screening for lipoid disorders: Secondary | ICD-10-CM | POA: Diagnosis not present

## 2024-06-03 DIAGNOSIS — G4733 Obstructive sleep apnea (adult) (pediatric): Secondary | ICD-10-CM

## 2024-06-03 DIAGNOSIS — M810 Age-related osteoporosis without current pathological fracture: Secondary | ICD-10-CM | POA: Diagnosis not present

## 2024-06-03 DIAGNOSIS — Z136 Encounter for screening for cardiovascular disorders: Secondary | ICD-10-CM

## 2024-06-03 DIAGNOSIS — F419 Anxiety disorder, unspecified: Secondary | ICD-10-CM

## 2024-06-03 DIAGNOSIS — D508 Other iron deficiency anemias: Secondary | ICD-10-CM

## 2024-06-03 DIAGNOSIS — I5181 Takotsubo syndrome: Secondary | ICD-10-CM

## 2024-06-03 DIAGNOSIS — Z23 Encounter for immunization: Secondary | ICD-10-CM

## 2024-06-03 DIAGNOSIS — R001 Bradycardia, unspecified: Secondary | ICD-10-CM

## 2024-06-03 DIAGNOSIS — E063 Autoimmune thyroiditis: Secondary | ICD-10-CM | POA: Diagnosis not present

## 2024-06-03 DIAGNOSIS — G629 Polyneuropathy, unspecified: Secondary | ICD-10-CM

## 2024-06-03 DIAGNOSIS — Z0000 Encounter for general adult medical examination without abnormal findings: Secondary | ICD-10-CM

## 2024-06-03 DIAGNOSIS — D510 Vitamin B12 deficiency anemia due to intrinsic factor deficiency: Secondary | ICD-10-CM

## 2024-06-03 DIAGNOSIS — R7989 Other specified abnormal findings of blood chemistry: Secondary | ICD-10-CM

## 2024-06-03 MED ORDER — DENOSUMAB 60 MG/ML ~~LOC~~ SOSY: 60.0000 mg | PREFILLED_SYRINGE | SUBCUTANEOUS | 1 refills | Status: DC

## 2024-06-03 MED ORDER — CITALOPRAM HYDROBROMIDE 20 MG PO TABS: 20.0000 mg | ORAL_TABLET | Freq: Every day | ORAL | 4 refills | Status: AC

## 2024-06-03 MED ORDER — BUSPIRONE HCL 15 MG PO TABS: 15.0000 mg | ORAL_TABLET | Freq: Two times a day (BID) | ORAL | 3 refills | Status: AC

## 2024-06-03 MED ORDER — GABAPENTIN 300 MG PO CAPS: ORAL_CAPSULE | ORAL | 4 refills | Status: AC

## 2024-06-03 MED ORDER — BUSPIRONE HCL 15 MG PO TABS: 15.0000 mg | ORAL_TABLET | Freq: Two times a day (BID) | ORAL | 1 refills | Status: DC

## 2024-06-03 NOTE — Progress Notes (Signed)
 BP 126/76 (BP Location: Left Arm, Patient Position: Sitting, Cuff Size: Normal)   Pulse (!) 51   Temp 98.1 F (36.7 C) (Oral)   Resp 14   Ht 5' 7.01 (1.702 m)   Wt 202 lb 12.8 oz (92 kg)   SpO2 98%   BMI 31.76 kg/m    Subjective:    Patient ID: [name] Robinson, female    DOB: [birth year], 73 y.o.   MRN: [id number]  HPI: [name] Robinson is a 73 y.o. female presenting on 06/03/2024 for Medicare Wellness and annual exam. Current medical complaints include:none  She currently lives with: husband Menopausal Symptoms: no  NEUROPATHY Started after hospitalization with sepsis almost 2 years ago. Continues Gabapentin  300 MG BID and 600 MG at night, saw neurology last on 05/05/24. 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 They recommended checking Myasthenia Gravis labs, she declined. Neuropathy status: uncontrolled  Satisfied with current treatment?: yes Medication side effects: none Medication compliance:  good compliance Location: both feet and calves Frequency: constant Bilateral: yes Symmetric: yes Numbness: no Decreased sensation: no Weakness: yes occasional Context: fluctuating Alleviating factors: Gabapentin   Aggravating factors: lying down Treatments attempted: Gabapentin    OSTEOPOROSIS Taking Fosamax  but not tolerating well, gets nausea and heart burn with this often and would like to try something else. No recent falls or fractures. Satisfied with current treatment?: no Medication side effects: yes Medication compliance: good compliance Past osteoporosis medications/treatments: Fosamax  Adequate calcium & vitamin D : yes Intolerance to bisphosphonates:yes Weight bearing exercises: yes   HYPOTHYROIDISM Last saw endo 10/31/23. Takes Levothyroxine  88 MCG. Thyroid  control status:stable Satisfied with current treatment? yes Medication side effects: no Medication compliance: good compliance Etiology of hypothyroidism:  Hashimoto's Recent dose adjustment:no Fatigue: occasional Cold intolerance: no Heat intolerance: no Weight gain: no Weight loss: no Constipation: no Diarrhea/loose stools: no Palpitations: no Lower extremity edema: no Anxiety/depressed mood: no   DEPRESSION Takes Celexa , Buspar , and Trazodone . Drained due to being caregiver often for husband. Mood status: stable Satisfied with current treatment?: yes Symptom severity: moderate  Duration of current treatment : chronic Side effects: no Medication compliance: good compliance Psychotherapy/counseling: yes in the past -- no longer Depressed mood: occasional Anxious mood: occasional Anhedonia: no Significant weight loss or gain: no Insomnia: no Fatigue: on occasion Feelings of worthlessness or guilt: no Impaired concentration/indecisiveness: no Suicidal ideations: no Hopelessness: no Crying spells: no    06/03/2024    2:01 PM 09/07/2023    1:38 PM 07/02/2023    4:12 PM 06/12/2023   11:25 AM 05/29/2023    3:10 PM  Depression screen PHQ 2/9  Decreased Interest 0 1 1 1 1   Down, Depressed, Hopeless 0 1 1 1 1   PHQ - 2 Score 0 2 2 2 2   Altered sleeping 0 0 0 0 0  Tired, decreased energy 2 0 1 1 2   Change in appetite 0 0 0 0 0  Feeling bad or failure about yourself  1 1 1 1 1   Trouble concentrating 0 0 0 0 0  Moving slowly or fidgety/restless 1 0 1 0 2  Suicidal thoughts 0 0 0 0 0  PHQ-9 Score 4 3 5 4 7   Difficult doing work/chores Somewhat difficult Not difficult at all Not difficult at all Somewhat difficult Not difficult at all      06/03/2024    2:02 PM 09/07/2023    1:38 PM 05/29/2023    3:11 PM 08/29/2022    1:45 PM  GAD  7 : Generalized Anxiety Score  Nervous, Anxious, on Edge 1 1 1  0  Control/stop worrying 1 1 2  0  Worry too much - different things 1 1 2 1   Trouble relaxing 0 0 1 0  Restless 0 0 0 0  Easily annoyed or irritable 1 1 1 1   Afraid - awful might happen 0 0 1 1  Total GAD 7 Score 4 4 8 3   Anxiety  Difficulty  Not difficult at all Not difficult at all       05/29/2023    3:10 PM 06/12/2023   11:29 AM 07/02/2023    4:12 PM 09/07/2023    1:37 PM 06/03/2024    1:55 PM  Fall Risk  Falls in the past year? 0 0 0 0 0  Was there an injury with Fall? 0 0 0 0 0  Fall Risk Category Calculator 0 0 0 0 0  Patient at Risk for Falls Due to No Fall Risks No Fall Risks No Fall Risks No Fall Risks No Fall Risks  Fall risk Follow up Falls evaluation completed Falls prevention discussed Falls evaluation completed Falls evaluation completed Falls evaluation completed    Functional Status Survey: Is the patient deaf or have difficulty hearing?: No Does the patient have difficulty seeing, even when wearing glasses/contacts?: No Does the patient have difficulty concentrating, remembering, or making decisions?: No Does the patient have difficulty walking or climbing stairs?: No Does the patient have difficulty dressing or bathing?: No Does the patient have difficulty doing errands alone such as visiting a doctor's office or shopping?: No   Past Medical History:  Past Medical History:  Diagnosis Date   Hypothyroidism    Morbid obesity (HCC) 11/2019   s/p Barriatric Sgx ([facility])   Sleep apnea    doesn't use CPAP, lost weight, not needed   Surgical History:  Past Surgical History:  Procedure Laterality Date   ABDOMINAL HYSTERECTOMY     BARIATRIC SURGERY N/A 01/02/2020   duodenal switch   CERVICAL DISC SURGERY N/A    discectomy   COLONOSCOPY     ESOPHAGOGASTRODUODENOSCOPY  11/06/2020   EYE SURGERY Bilateral    laser procedure   IR RADIOLOGIST EVAL & MGMT  04/05/2023   LAPAROSCOPIC COLON RESECTION     MASS EXCISION Left 04/13/2021   Procedure: EXCISION MASS, left lateral thigh;  Surgeon: Lane Shope, MD;  Location: [facility] ORS;  Service: General;  Laterality: Left;   SHOULDER SURGERY Left    torn cartilege   TONSILLECTOMY N/A    TOTAL HIP ARTHROPLASTY Right 11/02/2020   TRANSTHORACIC  ECHOCARDIOGRAM  07/14/2019   a) [facility]) Technically difficult.-Used Definity.  Nl LV Fxn -EF 55 - 60%.  No obvious WMA.  Mild MR.  No effusion;; b) Echo 04/16/2021-[facility]: (Fair quality) normal LV size and function.  EF 55-60%.  No R WMA.  Normal diastolic parameters.  Mild MR.  Unable to assess RVSP.    Medications:  Current Outpatient Medications on File Prior to Visit  Medication Sig   levothyroxine  (SYNTHROID ) 88 MCG tablet Take 1 tablet (88 mcg total) by mouth daily before breakfast.   traZODone  (DESYREL ) 50 MG tablet TAKE 2 TABLETS BY MOUTH AT BEDTIME AS NEEDED   vitamin B-12 (CYANOCOBALAMIN ) 1000 MCG tablet Take 1,000 mcg by mouth once a week.   Vitamin D , Ergocalciferol , (DRISDOL ) 1.25 MG (50000 UNIT) CAPS capsule TAKE 1 CAPSULE BY MOUTH ONE TIME PER WEEK   No current facility-administered medications on file prior to visit.  Allergies:  Allergies  Allergen Reactions   Solifenacin Rash    Patient had bad rash with this med.    Social History:  Social History   Socioeconomic History   Marital status: Married    Spouse name: Marinda   Number of children: 2   Years of education: Not on file   Highest education level: Some college, no degree  Occupational History   Occupation: retired  Tobacco Use   Smoking status: Never   Smokeless tobacco: Never  Vaping Use   Vaping status: Never Used  Substance and Sexual Activity   Alcohol use: Never   Drug use: Never   Sexual activity: Not Currently  Other Topics Concern   Not on file  Social History Narrative   04/20/22 She is retired, lives with husband.    Otherwise, she is usually on the go, not sedentary.   Social Drivers of Corporate investment banker Strain: Low Risk  (12/14/2023)   Received from [facility]   Overall Financial Resource Strain (CARDIA)    Difficulty of Paying Living Expenses: Not very hard  Food Insecurity: Food Insecurity Present (12/14/2023)   [facility]   Hunger Vital Sign    Within the past 12 months, you worried that your food would run out before you got the money to buy more.: Never true    Within the past 12 months, the food you bought just didn't last and you didn't have money to get more.: Sometimes true  Transportation Needs: No Transportation Needs (12/14/2023)   Received from [facility] - Transportation    In the past 12 months, has lack of transportation kept you from medical appointments or from getting medications?: No    Lack of Transportation (Non-Medical): No  Physical Activity: Insufficiently Active (06/12/2023)   Exercise Vital Sign    Days of Exercise per Week: 7 days    Minutes of Exercise per Session: 20 min  Stress: No Stress Concern Present (06/12/2023)   [employer] of Occupational Health - Occupational Stress Questionnaire    Feeling of Stress : Only a little  Recent Concern: Stress - Stress Concern Present (05/29/2023)   [employer] of Occupational Health - Occupational Stress Questionnaire    Feeling of Stress : To some extent  Social Connections: Socially Integrated (06/12/2023)   Social Connection and Isolation Panel    Frequency of Communication with Friends and Family: More than three times a week    Frequency of Social Gatherings with Friends and Family: Three times a week    Attends Religious Services: More than 4 times per year    Active Member of Clubs or Organizations: Yes    Attends Banker Meetings: More than 4 times per year    Marital Status: Married  Catering manager Violence: Not At Risk (06/12/2023)   Humiliation, Afraid, Rape, and Kick questionnaire    Fear of Current or Ex-Partner: No    Emotionally Abused: No    Physically Abused: No    Sexually Abused: No   Social History   Tobacco Use  Smoking Status Never  Smokeless Tobacco Never   Social History   Substance and Sexual Activity  Alcohol Use Never    Family  History:  Family History  Problem Relation Age of Onset   Thyroid  disease Mother    Cancer - Lung Father    Cancer - Lung Brother    Cancer Brother  Past medical history, surgical history, medications, allergies, family history and social history reviewed with patient today and changes made to appropriate areas of the chart.   ROS All other ROS negative except what is listed above and in the HPI.      Objective:    BP 126/76 (BP Location: Left Arm, Patient Position: Sitting, Cuff Size: Normal)   Pulse (!) 51   Temp 98.1 F (36.7 C) (Oral)   Resp 14   Ht 5' 7.01 (1.702 m)   Wt 202 lb 12.8 oz (92 kg)   SpO2 98%   BMI 31.76 kg/m   Wt Readings from Last 3 Encounters:  06/03/24 202 lb 12.8 oz (92 kg)  10/31/23 187 lb 3.2 oz (84.9 kg)  09/07/23 185 lb 9.6 oz (84.2 kg)    Physical Exam Vitals and nursing note reviewed. Exam conducted with a chaperone present.  Constitutional:      General: She is awake. She is not in acute distress.    Appearance: She is well-developed and well-groomed. She is not ill-appearing or toxic-appearing.  HENT:     Head: Normocephalic and atraumatic.     Right Ear: Hearing, tympanic membrane, ear canal and external ear normal. No drainage.     Left Ear: Hearing, tympanic membrane, ear canal and external ear normal. No drainage.     Nose: Nose normal.     Right Sinus: No maxillary sinus tenderness or frontal sinus tenderness.     Left Sinus: No maxillary sinus tenderness or frontal sinus tenderness.     Mouth/Throat:     Mouth: Mucous membranes are moist.     Pharynx: Oropharynx is clear. Uvula midline. No pharyngeal swelling, oropharyngeal exudate or posterior oropharyngeal erythema.  Eyes:     General: Lids are normal.        Right eye: No discharge.        Left eye: No discharge.     Extraocular Movements: Extraocular movements intact.     Conjunctiva/sclera: Conjunctivae normal.     Pupils: Pupils are equal, round, and reactive to light.      Visual Fields: Right eye visual fields normal and left eye visual fields normal.  Neck:     Thyroid : No thyromegaly.     Vascular: No carotid bruit.     Trachea: Trachea normal.  Cardiovascular:     Rate and Rhythm: Regular rhythm. Bradycardia present.     Heart sounds: Normal heart sounds. No murmur heard.    No gallop.  Pulmonary:     Effort: Pulmonary effort is normal. No accessory muscle usage or respiratory distress.     Breath sounds: Normal breath sounds.  Chest:  Breasts:    Right: Normal.     Left: Normal.  Abdominal:     General: Bowel sounds are normal.     Palpations: Abdomen is soft. There is no hepatomegaly or splenomegaly.     Tenderness: There is no abdominal tenderness.  Musculoskeletal:        General: Normal range of motion.     Cervical back: Normal range of motion and neck supple.     Right lower leg: No edema.     Left lower leg: No edema.  Lymphadenopathy:     Head:     Right side of head: No submental, submandibular, tonsillar, preauricular or posterior auricular adenopathy.     Left side of head: No submental, submandibular, tonsillar, preauricular or posterior auricular adenopathy.     Cervical: No cervical adenopathy.  Upper Body:     Right upper body: No supraclavicular, axillary or pectoral adenopathy.     Left upper body: No supraclavicular, axillary or pectoral adenopathy.  Skin:    General: Skin is warm and dry.     Capillary Refill: Capillary refill takes less than 2 seconds.     Findings: No rash.  Neurological:     Mental Status: She is alert and oriented to person, place, and time.     Gait: Gait is intact.     Deep Tendon Reflexes: Reflexes are normal and symmetric.     Reflex Scores:      Brachioradialis reflexes are 2+ on the right side and 2+ on the left side.      Patellar reflexes are 2+ on the right side and 2+ on the left side. Psychiatric:        Attention and Perception: Attention normal.        Mood and Affect: Mood  normal.        Speech: Speech normal.        Behavior: Behavior normal. Behavior is cooperative.        Thought Content: Thought content normal.        Judgment: Judgment normal.       06/12/2023   11:30 AM 05/29/2023    8:03 PM 05/03/2022   10:06 AM 04/28/2021    4:37 PM  6CIT Screen  What Year? 0 points 0 points 0 points 0 points  What month? 0 points 0 points 0 points 0 points  What time? 0 points 0 points 0 points 0 points  Count back from 20 0 points 0 points 0 points 0 points  Months in reverse 0 points 0 points 0 points 4 points  Repeat phrase 0 points 0 points 0 points 0 points  Total Score 0 points 0 points 0 points 4 points   Results for orders placed or performed in visit on 10/30/23  Surgical pathology   Collection Time: 10/30/23 12:00 AM  Result Value Ref Range   SURGICAL PATHOLOGY      SURGICAL PATHOLOGY [facility] [address], [address] [place], [place] [address] Telephone [phone] or [phone] Fax [phone]  REPORT OF DERMATOPATHOLOGY   Accession #: [id number] Patient Name: DEMYA, SCRUGGS Visit # : [id number]  MRN: [id number] Cytotechnologist: Munoz-Bishop Md, Eleanor, Dermatopathologist, Electronic Signature DOB/Age [birth year] (Age: 89) Gender: F Collected Date: 10/30/2023 Received Date: 10/31/2023  FINAL DIAGNOSIS       1. Skin, right chest :       LICHENOID KERATOSIS       2. Skin, right upper arm :       DERMATOFIBROMA, BASE INVOLVED       ELECTRONIC SIGNATURE : Munoz-Bishop Md, Melissa, Dermatopathologist, Electronic Signature  MICROSCOPIC DESCRIPTION 1. The epidermis shows hyperkeratosis and partial effacement of the rete pattern.There is vacuolar change in the basal layer with a lymphohistiocytic infiltrate at the superficial dermis.There are colloid bodies.There is no significant atypia . 2. There is a proliferation of relatively uniform spindle cells in the dermis.There are thickened collagen  bundles at the periphery of the lesion.There is no atypia.The features are consistent with a dermatofibroma.These changes extend to the base of the biopsy.  CASE COMMENTS STAINS USED IN DIAGNOSIS: H&E H&E    CLINICAL HISTORY  SPECIMEN(S) OBTAINED 1. Skin, Right Chest 2. Skin, Right Upper Arm  SPECIMEN COMMENTS: 1. 0.7 cm pink scaly macule 2. 1.3 cm indurated plaque with overlying  hyperpigmentation SPECIMEN CLINICAL INFORMATION: 1. Neoplasm of uncertain behavior of skin, AK vs SCC 2. Neoplasm of uncertain behavior of skin, dermatofibroma vs melanoma    Gross Description 1. Formalin fixed specimen received:  7 X 6 X 1 MM, TOTO (3 P) (1 B) ( QB ) 2. Formalin fixed specimen received:  10 X 7 X 1 MM, TOTO (4 P) (1 B) ( QB )        Report signed out from the following location(s) [place]  [facility] [address] [place], [place]  [address] CLIA #: [id number]  [facility] [address] [place], [place] [address] CLIA #: [id number]       Assessment & Plan:   Problem List Items Addressed This Visit       Cardiovascular and Mediastinum   Takotsubo cardiomyopathy   Resolved in hospital in past.  Followed by cardiology locally in past, will have her return in future as needed.      Relevant Orders   Comprehensive metabolic panel with GFR   Bradycardia, sinus   Followed by cardiology in past, will have her return as needed.  HR apical ranges 50 to 60 and overall no symptoms presenting.        Endocrine   Hashimoto's thyroiditis   Chronic, stable.  Continue current medication regimen and adjust as needed.  Thyroid  ultrasound completed and overall reassuring.  Continue collaboration with endo. Labs today.      Relevant Orders   T4, free   TSH     Nervous and Auditory   Neuropathy - Primary   Ongoing.  Post hospitalization with septic shock and is ongoing issue.  Will continue Gabapentin  as ordered which is offering some benefit.   Continue to collaborate with neurology as needed. 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      Relevant Orders   Magnesium     Musculoskeletal and Integument   Osteoporosis   New diagnosis on 06/26/23.  Not tolerating Fosamax , having GI side effects.  Will work on getting Prolia covered which she may tolerate better.  Educated her on this medication and side effects.  She will alert provider if able to get covered, then come to office for nurse visits to inject.  Repeat DEXA around 06/25/25.  May need to get endo to assist if unable to get coverage or too costly.      Relevant Medications   denosumab (PROLIA) 60 MG/ML SOSY injection   Other Relevant Orders   VITAMIN D  25 Hydroxy (Vit-D Deficiency, Fractures)     Other   Elevated ferritin (Chronic)   Ongoing since sepsis almost 2 years ago.  Recheck CBC and ferritin today.  Return to hematology as needed, they suspect related to chronic inflammation.      Pernicious anemia   Chronic, ongoing.  Continue current supplement and collaboration with hematology as needed.  Recheck labs.      Relevant Orders   CBC with Differential/Platelet   Vitamin B12   Iron deficiency anemia   Present since bariatric surgery and followed by hematology in past.  Continue supplements at home and collaboration with hematology as needed.  Recheck labs today.      Relevant Orders   CBC with Differential/Platelet   Ferritin   Iron   Anxiety   Chronic, improving with PTSD aspect present from lengthy hospitalization with septic shock.  Denies SI/HI.  Continue Celexa  20 MG daily and Buspar  15 MG  TID + Trazodone .  Return to therapy as needed.      Relevant Medications   citalopram  (CELEXA ) 20 MG tablet   busPIRone  (BUSPAR ) 15 MG tablet   Other Visit Diagnoses       Encounter for lipid screening for cardiovascular disease       Lipid panel today.   Relevant Orders   Lipid Panel w/o Chol/HDL Ratio      Flu vaccine need       Flu vaccine today, educated patient.   Relevant Orders   Flu vaccine HIGH DOSE PF(Fluzone Trivalent)     Encounter for annual physical exam       Annual physical today with labs and health maintenance reviewed, discussed with patient.        Follow up plan: Return in about 6 months (around 12/02/2024) for OSTEOPOROSIS, Hypothyroid, Neuropathy.   LABORATORY TESTING:  - Pap smear: not applicable  IMMUNIZATIONS:   - Tdap: Tetanus vaccination status reviewed: last tetanus booster within 10 years. - Influenza: Up to date - Pneumovax: Not applicable - Prevnar: Administered today - COVID: Up to date - HPV: Not applicable - Shingrix  vaccine: Refused  SCREENING: -Mammogram: Up to date , scheduled on 06/24/24 - Colonoscopy: Refused  - Bone Density: Due next on 06/25/25 -Hearing Test: Not applicable  -Spirometry: Not applicable   PATIENT COUNSELING:   Advised to take 1 mg of folate supplement per day if capable of pregnancy.   Sexuality: Discussed sexually transmitted diseases, partner selection, use of condoms, avoidance of unintended pregnancy  and contraceptive alternatives.   Advised to avoid cigarette smoking.  I discussed with the patient that most people either abstain from alcohol or drink within safe limits (<=14/week and <=4 drinks/occasion for males, <=7/weeks and <= 3 drinks/occasion for females) and that the risk for alcohol disorders and other health effects rises proportionally with the number of drinks per week and how often a drinker exceeds daily limits.  Discussed cessation/primary prevention of drug use and availability of treatment for abuse.   Diet: Encouraged to adjust caloric intake to maintain  or achieve ideal body weight, to reduce intake of dietary saturated fat and total fat, to limit sodium intake by avoiding high sodium foods and not adding table salt, and to maintain adequate dietary potassium and calcium preferably from fresh  fruits, vegetables, and low-fat dairy products.    Stressed the importance of regular exercise  Injury prevention: Discussed safety belts, safety helmets, smoke detector, smoking near bedding or upholstery.   Dental health: Discussed importance of regular tooth brushing, flossing, and dental visits.    NEXT PREVENTATIVE PHYSICAL DUE IN 1 YEAR. Return in about 6 months (around 12/02/2024) for OSTEOPOROSIS, Hypothyroid, Neuropathy.

## 2024-06-03 NOTE — Assessment & Plan Note (Signed)
 Ongoing since sepsis almost 2 years ago.  Recheck CBC and ferritin today.  Return to hematology as needed, they suspect related to chronic inflammation.

## 2024-06-03 NOTE — Assessment & Plan Note (Signed)
Followed by cardiology in past, will have her return as needed.  HR apical ranges 50 to 60 and overall no symptoms presenting.

## 2024-06-03 NOTE — Assessment & Plan Note (Signed)
Present since bariatric surgery and followed by hematology in past.  Continue supplements at home and collaboration with hematology as needed.  Recheck labs today.

## 2024-06-03 NOTE — Assessment & Plan Note (Signed)
Chronic, improving with PTSD aspect present from lengthy hospitalization with septic shock.  Denies SI/HI.  Continue Celexa 20 MG daily and Buspar 15 MG TID + Trazodone.  Return to therapy as needed.

## 2024-06-03 NOTE — Assessment & Plan Note (Signed)
 Chronic, stable.  Continue current medication regimen and adjust as needed.  Thyroid  ultrasound completed and overall reassuring.  Continue collaboration with endo. Labs today.

## 2024-06-03 NOTE — Assessment & Plan Note (Signed)
 New diagnosis on 06/26/23.  Not tolerating Fosamax , having GI side effects.  Will work on getting Prolia covered which she may tolerate better.  Educated her on this medication and side effects.  She will alert provider if able to get covered, then come to office for nurse visits to inject.  Repeat DEXA around 06/25/25.  May need to get endo to assist if unable to get coverage or too costly.

## 2024-06-03 NOTE — Assessment & Plan Note (Signed)
 Ongoing.  Post hospitalization with septic shock and is ongoing issue.  Will continue Gabapentin  as ordered which is offering some benefit.  Continue to collaborate with neurology as needed. 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

## 2024-06-03 NOTE — Assessment & Plan Note (Signed)
Chronic, ongoing.  Continue current supplement and collaboration with hematology as needed.  Recheck labs.

## 2024-06-03 NOTE — Assessment & Plan Note (Signed)
Resolved in hospital in past.  Followed by cardiology locally in past, will have her return in future as needed.

## 2024-06-04 ENCOUNTER — Other Ambulatory Visit: Payer: Self-pay | Admitting: Nurse Practitioner

## 2024-06-04 ENCOUNTER — Ambulatory Visit: Payer: Self-pay | Admitting: Nurse Practitioner

## 2024-06-04 LAB — COMPREHENSIVE METABOLIC PANEL WITH GFR
ALT: 13 IU/L (ref 0–32)
AST: 12 IU/L (ref 0–40)
Albumin: 4.1 g/dL (ref 3.8–4.8)
Alkaline Phosphatase: 98 IU/L (ref 49–135)
BUN/Creatinine Ratio: 27 (ref 12–28)
BUN: 17 mg/dL (ref 8–27)
Bilirubin Total: 0.4 mg/dL (ref 0.0–1.2)
CO2: 24 mmol/L (ref 20–29)
Calcium: 8.8 mg/dL (ref 8.7–10.3)
Chloride: 105 mmol/L (ref 96–106)
Creatinine, Ser: 0.64 mg/dL (ref 0.57–1.00)
Globulin, Total: 1.9 g/dL (ref 1.5–4.5)
Glucose: 75 mg/dL (ref 70–99)
Potassium: 4 mmol/L (ref 3.5–5.2)
Sodium: 141 mmol/L (ref 134–144)
Total Protein: 6 g/dL (ref 6.0–8.5)
eGFR: 94 mL/min/1.73 (ref >59)

## 2024-06-04 LAB — CBC WITH DIFFERENTIAL/PLATELET
Basophils Absolute: 0 x10E3/uL (ref 0.0–0.2)
Basos: 1 %
EOS (ABSOLUTE): 0 x10E3/uL (ref 0.0–0.4)
Eos: 0 %
Hematocrit: 36.6 % (ref 34.0–46.6)
Hemoglobin: 12 g/dL (ref 11.1–15.9)
Immature Grans (Abs): 0 x10E3/uL (ref 0.0–0.1)
Immature Granulocytes: 0 %
Lymphocytes Absolute: 1.4 x10E3/uL (ref 0.7–3.1)
Lymphs: 23 %
MCH: 29.7 pg (ref 26.6–33.0)
MCHC: 32.8 g/dL (ref 31.5–35.7)
MCV: 91 fL (ref 79–97)
Monocytes Absolute: 0.3 x10E3/uL (ref 0.1–0.9)
Monocytes: 5 %
Neutrophils Absolute: 4.2 x10E3/uL (ref 1.4–7.0)
Neutrophils: 71 %
Platelets: 170 x10E3/uL (ref 150–450)
RBC: 4.04 x10E6/uL (ref 3.77–5.28)
RDW: 12 % (ref 11.7–15.4)
WBC: 5.9 x10E3/uL (ref 3.4–10.8)

## 2024-06-04 LAB — LIPID PANEL W/O CHOL/HDL RATIO
Cholesterol, Total: 160 mg/dL (ref 100–199)
HDL: 78 mg/dL (ref >39)
LDL Chol Calc (NIH): 63 mg/dL (ref 0–99)
Triglycerides: 111 mg/dL (ref 0–149)
VLDL Cholesterol Cal: 19 mg/dL (ref 5–40)

## 2024-06-04 LAB — MAGNESIUM: Magnesium: 2 mg/dL (ref 1.6–2.3)

## 2024-06-04 LAB — VITAMIN D 25 HYDROXY (VIT D DEFICIENCY, FRACTURES): Vit D, 25-Hydroxy: 27.8 ng/mL — ABNORMAL LOW (ref 30.0–100.0)

## 2024-06-04 LAB — IRON: Iron: 51 ug/dL (ref 27–139)

## 2024-06-04 LAB — TSH: TSH: 0.991 u[IU]/mL (ref 0.450–4.500)

## 2024-06-04 LAB — VITAMIN B12: Vitamin B-12: 390 pg/mL (ref 232–1245)

## 2024-06-04 LAB — FERRITIN: Ferritin: 707 ng/mL — ABNORMAL HIGH (ref 15–150)

## 2024-06-04 LAB — T4, FREE: Free T4: 1.23 ng/dL (ref 0.82–1.77)

## 2024-06-04 NOTE — Progress Notes (Signed)
 Contacted via MyChart  Good morning [name] Robinson, your labs have returned: - Ferritin remains a bit elevated, but has trended down from last check. We will continue to monitor and get back into hematology as needed.  Iron level normal.  B12 on lower side of normal.  Continue supplement daily, 1000 MCG B12. - Thyroid  labs are normal, continue current dosing and I will alert endocrinology. - Kidney function, creatinine and eGFR, remains normal, as is liver function, AST and ALT.  - Remainder of labs look great, no changes needed.  Keep up the good work. Keep being stellar!!  Thank you for allowing me to participate in your care.  I appreciate you. Kindest regards, Francille Wittmann

## 2024-06-05 NOTE — Telephone Encounter (Signed)
 Requested Prescriptions  Refused Prescriptions Disp Refills   alendronate  (FOSAMAX ) 70 MG tablet [Pharmacy Med Name: ALENDRONATE  SODIUM 70 MG TAB] 12 tablet 3    Sig: TAKE 1 TABLET (70 MG TOTAL) BY MOUTH EVERY 7 DAYS WITH FULL GLASS WATER ON EMPTY STOMACH     Endocrinology:  Bisphosphonates Failed - 06/05/2024  3:56 PM      Failed - Vitamin D  in normal range and within 360 days    Vit D, 25-Hydroxy  Date Value Ref Range Status  06/03/2024 27.8 (L) 30.0 - 100.0 ng/mL Final    Comment:    Vitamin D  deficiency has been defined by the Institute of Medicine and an Endocrine Society practice guideline as a level of serum 25-OH vitamin D  less than 20 ng/mL (1,2). The Endocrine Society went on to further define vitamin D  insufficiency as a level between 21 and 29 ng/mL (2). 1. IOM (Institute of Medicine). 2010. Dietary reference    intakes for calcium and D. [place]: The    [employer]. 2. Holick MF, Binkley [place], Bischoff-Ferrari HA, et al.    Evaluation, treatment, and prevention of vitamin D     deficiency: an Endocrine Society clinical practice    guideline. JCEM. 2011 Jul; 96(7):1911-30.          Failed - Phosphate in normal range and within 360 days    No results found for: PHOS       Passed - Ca in normal range and within 360 days    Calcium  Date Value Ref Range Status  06/03/2024 8.8 8.7 - 10.3 mg/dL Final         Passed - Cr in normal range and within 360 days    Creatinine, Ser  Date Value Ref Range Status  06/03/2024 0.64 0.57 - 1.00 mg/dL Final         Passed - Mg Level in normal range and within 360 days    Magnesium  Date Value Ref Range Status  06/03/2024 2.0 1.6 - 2.3 mg/dL Final         Passed - eGFR is 30 or above and within 360 days    GFR, Estimated  Date Value Ref Range Status  06/10/2021 >60 >60 mL/min Final    Comment:    (NOTE) Calculated using the CKD-EPI Creatinine Equation (2021)    eGFR  Date Value Ref Range Status   06/03/2024 94 >59 mL/min/1.73 Final         Passed - Valid encounter within last 12 months    Recent Outpatient Visits           2 days ago Neuropathy   [facility] Valerio Melanie DASEN, NP       Future Appointments             In 4 months Claudene Lehmann, MD [facility]            Passed - Bone Mineral Density or Dexa Scan completed in the last 2 years

## 2024-06-23 ENCOUNTER — Telehealth: Payer: Self-pay

## 2024-06-23 NOTE — Telephone Encounter (Signed)
 Prolia  VOB initiated via [email or web address]  Next Prolia  inj DUE: NEW START

## 2024-06-23 NOTE — Telephone Encounter (Signed)
 Request received. Sending to the [facility] team. Thanks.

## 2024-06-24 ENCOUNTER — Encounter

## 2024-06-24 ENCOUNTER — Other Ambulatory Visit (HOSPITAL_COMMUNITY): Payer: Self-pay

## 2024-06-24 NOTE — Telephone Encounter (Addendum)
 MEDICAL PA SUBMITTED VIA LATENT. KEY: [id number]

## 2024-06-24 NOTE — Telephone Encounter (Signed)
 PHARMACY COPAY: $930.81

## 2024-06-24 NOTE — Telephone Encounter (Signed)
 SABRA

## 2024-06-25 LAB — FECAL OCCULT BLOOD, IMMUNOCHEMICAL: Fecal Occult Blood: NEGATIVE

## 2024-06-25 NOTE — Telephone Encounter (Signed)
 Pt ready for scheduling for PROLIA on or after : 06/25/24  Option# 1: Buy/Bill (Office supplied medication)  Out-of-pocket cost due at time of clinic visit: $347  Number of injection/visits approved: 2  Primary: [employer] Prolia co-insurance: 20% Admin fee co-insurance: $15  Secondary: --- Prolia co-insurance:  Admin fee co-insurance:   Medical Benefit Details: Date Benefits were checked: 06/24/24 Deductible: NO/ Coinsurance: 20%/ Admin Fee: $15  Prior Auth: APPROVED PA# [id number] Expiration Date: 06/25/24-08/13/25  # of doses approved: 2 ----------------------------------------------------------------------- Option# 2- Med Obtained from pharmacy:  Pharmacy benefit: Copay $930.81 (Paid to pharmacy) Admin Fee: $15 (Pay at clinic)  Prior Auth: N/A PA# Expiration Date:   # of doses approved:   If patient wants fill through the pharmacy benefit please send prescription to: [facility], and include estimated need by date in rx notes. Pharmacy will ship medication directly to the office.  Patient NOT eligible for Prolia Copay Card. Copay Card can make patient's cost as little as $25. Link to apply: [email or web address]://[email or web address]  ** This summary of benefits is an estimation of the patient's out-of-pocket cost. Exact cost may very based on individual plan coverage.

## 2024-06-27 ENCOUNTER — Ambulatory Visit
Admission: RE | Admit: 2024-06-27 | Discharge: 2024-06-27 | Disposition: A | Discharge status: Home / Self Care | Source: Ambulatory Visit | Attending: Nurse Practitioner | Admitting: Nurse Practitioner

## 2024-06-27 DIAGNOSIS — Z1231 Encounter for screening mammogram for malignant neoplasm of breast: Secondary | ICD-10-CM | POA: Diagnosis present

## 2024-06-30 ENCOUNTER — Encounter: Payer: Self-pay | Admitting: Nurse Practitioner

## 2024-07-01 ENCOUNTER — Ambulatory Visit (INDEPENDENT_AMBULATORY_CARE_PROVIDER_SITE_OTHER): Admitting: Emergency Medicine

## 2024-07-01 ENCOUNTER — Ambulatory Visit: Payer: Self-pay | Admitting: Nurse Practitioner

## 2024-07-01 VITALS — Ht 67.0 in | Wt 185.0 lb

## 2024-07-01 DIAGNOSIS — Z0000 Encounter for general adult medical examination without abnormal findings: Secondary | ICD-10-CM | POA: Diagnosis not present

## 2024-07-01 NOTE — Progress Notes (Signed)
 Chief Complaint  Patient presents with   Medicare Wellness     Subjective:   [name] Robinson is a 73 y.o. female who presents for a Medicare Annual Wellness Visit.  Allergies (verified) Solifenacin   History: Past Medical History:  Diagnosis Date   Hypothyroidism    Morbid obesity (HCC) 11/2019   s/p Barriatric Sgx ([facility])   Sleep apnea    doesn't use CPAP, lost weight, not needed   Past Surgical History:  Procedure Laterality Date   ABDOMINAL HYSTERECTOMY     BARIATRIC SURGERY N/A 01/02/2020   duodenal switch   CERVICAL DISC SURGERY N/A    discectomy   COLONOSCOPY     ESOPHAGOGASTRODUODENOSCOPY  11/06/2020   EYE SURGERY Bilateral    laser procedure   IR RADIOLOGIST EVAL & MGMT  04/05/2023   LAPAROSCOPIC COLON RESECTION     MASS EXCISION Left 04/13/2021   Procedure: EXCISION MASS, left lateral thigh;  Surgeon: Lane Shope, MD;  Location: [facility] ORS;  Service: General;  Laterality: Left;   SHOULDER SURGERY Left    torn cartilege   TONSILLECTOMY N/A    TOTAL HIP ARTHROPLASTY Right 11/02/2020   TRANSTHORACIC ECHOCARDIOGRAM  07/14/2019   a) [facility]) Technically difficult.-Used Definity.  Nl LV Fxn -EF 55 - 60%.  No obvious WMA.  Mild MR.  No effusion;; b) Echo 04/16/2021-[facility]: (Fair quality) normal LV size and function.  EF 55-60%.  No R WMA.  Normal diastolic parameters.  Mild MR.  Unable to assess RVSP.   Family History  Problem Relation Age of Onset   Thyroid  disease Mother    Cancer - Lung Father    Cancer - Lung Brother    Cancer Brother    Breast cancer Neg Hx    Social History   Occupational History   Occupation: retired  Tobacco Use   Smoking status: Never   Smokeless tobacco: Never  Vaping Use   Vaping status: Never Used  Substance and Sexual Activity   Alcohol use: Never   Drug use: Never   Sexual activity: Not Currently   Tobacco Counseling Counseling given: Not Answered  SDOH Screenings   Food Insecurity: No Food Insecurity  (07/01/2024)  Housing: Low Risk  (07/01/2024)  Transportation Needs: No Transportation Needs (07/01/2024)  Utilities: Not At Risk (07/01/2024)  Alcohol Screen: Low Risk  (06/12/2023)  Depression (PHQ2-9): Low Risk  (07/01/2024)  Financial Resource Strain: Low Risk  (12/14/2023)   Received from [facility]  Physical Activity: Sufficiently Active (07/01/2024)  Social Connections: Socially Integrated (07/01/2024)  Stress: No Stress Concern Present (07/01/2024)  Tobacco Use: Low Risk  (07/01/2024)  Health Literacy: Adequate Health Literacy (07/01/2024)   See flowsheets for full screening details  Depression Screen PHQ 2 & 9 Depression Scale- Over the past 2 weeks, how often have you been bothered by any of the following problems? Little interest or pleasure in doing things: 0 Feeling down, depressed, or hopeless (PHQ Adolescent also includes...irritable): 0 PHQ-2 Total Score: 0 Trouble falling or staying asleep, or sleeping too much: 0 Feeling tired or having little energy: 1 Poor appetite or overeating (PHQ Adolescent also includes...weight loss): 0 Feeling bad about yourself - or that you are a failure or have let yourself or your family down: 0 Trouble concentrating on things, such as reading the newspaper or watching television (PHQ Adolescent also includes...like school work): 0 Moving or speaking so slowly that other people could have noticed. Or the opposite - being so fidgety or restless  that you have been moving around a lot more than usual: 0 Thoughts that you would be better off dead, or of hurting yourself in some way: 0 PHQ-9 Total Score: 1 If you checked off any problems, how difficult have these problems made it for you to do your work, take care of things at home, or get along with other people?: Not difficult at all  Depression Treatment Depression Interventions/Treatment : Currently on Treatment; PHQ2-9 Score <4 Follow-up Not Indicated     Goals  Addressed               This Visit's Progress     lose more weight (pt-stated)         Visit info / Clinical Intake: Medicare Wellness Visit Type:: Subsequent Annual Wellness Visit Persons participating in visit:: patient Medicare Wellness Visit Mode:: Telephone If telephone:: video declined Because this visit was a virtual/telehealth visit:: pt reported vitals If Telephone or Video please confirm:: I connected with the patient using audio enabled telemedicine application and verified that I am speaking with the correct person using two identifiers; I discussed the limitations of evaluation and management by telemedicine; The patient expressed understanding and agreed to proceed Patient Location:: home Provider Location:: office/clinic Information given by:: patient Interpreter Needed?: No Pre-visit prep was completed: yes AWV questionnaire completed by patient prior to visit?: no Living arrangements:: lives with spouse/significant other Patient's Overall Health Status Rating: good Typical amount of pain: none Does pain affect daily life?: no Are you currently prescribed opioids?: no  Dietary Habits and Nutritional Risks How many meals a day?: 3 Eats fruit and vegetables daily?: (!) no Most meals are obtained by: preparing own meals In the last 2 weeks, have you had any of the following?: none Diabetic:: no  Functional Status Activities of Daily Living (to include ambulation/medication): Independent Ambulation: Independent with device- listed below Home Assistive Devices/Equipment: Eyeglasses Medication Administration: Independent Home Management: Independent Manage your own finances?: yes Primary transportation is: driving Concerns about vision?: no *vision screening is required for WTM* Concerns about hearing?: no  Fall Screening Falls in the past year?: 0 Number of falls in past year: 0 Was there an injury with Fall?: 0 Fall Risk Category Calculator: 0 Patient  Fall Risk Level: Low Fall Risk  Fall Risk Patient at Risk for Falls Due to: No Fall Risks Fall risk Follow up: Falls evaluation completed  Home and Transportation Safety: All rugs have non-skid backing?: yes All stairs or steps have railings?: N/A, no stairs Grab bars in the bathtub or shower?: yes Have non-skid surface in bathtub or shower?: yes Good home lighting?: yes Regular seat belt use?: yes Hospital stays in the last year:: no  Cognitive Assessment Difficulty concentrating, remembering, or making decisions? : no Will 6CIT or Mini Cog be Completed: yes What year is it?: 0 points What month is it?: 0 points Give patient an address phrase to remember (5 components): [address] [place] About what time is it?: 3 points (9:50) Count backwards from 20 to 1: 0 points Say the months of the year in reverse: 0 points Repeat the address phrase from earlier: 0 points 6 CIT Score: 3 points  Advance Directives (For Healthcare) Does Patient Have a Medical Advance Directive?: Yes Does patient want to make changes to medical advance directive?: No - Patient declined Type of Advance Directive: Living will Copy of Living Will in Chart?: No - copy requested  Reviewed/Updated  Reviewed/Updated: Reviewed All (Medical, Surgical, Family, Medications, Allergies,  Care Teams, Patient Goals)        Objective:    Today's Vitals   07/01/24 0847  Weight: 185 lb (83.9 kg)  Height: 5' 7 (1.702 m)   Body mass index is 28.98 kg/m.  Current Medications (verified) Outpatient Encounter Medications as of 07/01/2024  Medication Sig   busPIRone  (BUSPAR ) 15 MG tablet Take 1 tablet (15 mg total) by mouth 2 (two) times daily.   citalopram  (CELEXA ) 20 MG tablet Take 1 tablet (20 mg total) by mouth daily.   gabapentin  (NEURONTIN ) 300 MG capsule Take 300 MG (one capsule) by mouth in the morning and in the afternoon and then 600 MG (two capsules) by mouth at bedtime.   levothyroxine  (SYNTHROID )  88 MCG tablet Take 1 tablet (88 mcg total) by mouth daily before breakfast.   traZODone  (DESYREL ) 50 MG tablet TAKE 2 TABLETS BY MOUTH AT BEDTIME AS NEEDED (Patient taking differently: Taking 1 tablet qhs)   vitamin B-12 (CYANOCOBALAMIN ) 1000 MCG tablet Take 1,000 mcg by mouth once a week.   Vitamin D , Ergocalciferol , (DRISDOL ) 1.25 MG (50000 UNIT) CAPS capsule TAKE 1 CAPSULE BY MOUTH ONE TIME PER WEEK   denosumab (PROLIA) 60 MG/ML SOSY injection Inject 60 mg into the skin every 6 (six) months. (Patient not taking: Reported on 07/01/2024)   No facility-administered encounter medications on file as of 07/01/2024.   Hearing/Vision screen Hearing Screening - Comments:: Denies hearing loss  Vision Screening - Comments:: UTD @ [facility] [place] Immunizations and Health Maintenance Health Maintenance  Topic Date Due   Fecal DNA (Cologuard)  Never done   COVID-19 Vaccine (3 - Pfizer risk series) 12/03/2019   Mammogram  06/21/2024   Zoster Vaccines- Shingrix  (1 of 2) 09/01/2024 (Originally 11/12/1969)   DEXA SCAN  06/25/2025   Medicare Annual Wellness (AWV)  07/01/2025   DTaP/Tdap/Td (3 - Td or Tdap) 03/01/2031   Pneumococcal Vaccine: 50+ Years  Completed   Influenza Vaccine  Completed   Hepatitis C Screening  Completed   Meningococcal B Vaccine  Aged Out   Colonoscopy  Discontinued        Assessment/Plan:  This is a routine wellness examination for Jaynee.  Patient Care Team: Valerio Melanie DASEN, NP as PCP - General (Nurse Practitioner) Lane Arthea BRAVO, MD as Referring Physician (Neurology) Therisa Benton PARAS, NP as Nurse Practitioner (Endocrinology) Claudene Lehmann, MD as Referring Physician (Dermatology) Pa, [facility])  I have personally reviewed and noted the following in the patient's chart:   Medical and social history Use of alcohol, tobacco or illicit drugs  Current medications and supplements including opioid prescriptions. Functional  ability and status Nutritional status Physical activity Advanced directives List of other physicians Hospitalizations, surgeries, and ER visits in previous 12 months Vitals Screenings to include cognitive, depression, and falls Referrals and appointments  No orders of the defined types were placed in this encounter.  In addition, I have reviewed and discussed with patient certain preventive protocols, quality metrics, and best practice recommendations. A written personalized care plan for preventive services as well as general preventive health recommendations were provided to patient.   Vina Ned, CMA   07/01/2024   Return in 1 year (on 07/02/2025).  After Visit Summary: (MyChart) Due to this being a telephonic visit, the after visit summary with patients personalized plan was offered to patient via MyChart   Nurse Notes:  6 CIT Score - 3 Patient has MMG on 06/27/24 Patient completed cologuard ~ 1 week ago  Declined Shingles and Covid vaccines

## 2024-07-01 NOTE — Patient Instructions (Signed)
 [name] Robinson,  Thank you for taking the time for your Medicare Wellness Visit. I appreciate your continued commitment to your health goals. Please review the care plan we discussed, and feel free to reach out if I can assist you further.  Please note that Annual Wellness Visits do not include a physical exam. Some assessments may be limited, especially if the visit was conducted virtually. If needed, we may recommend an in-person follow-up with your provider.  Ongoing Care Seeing your primary care provider every 3 to 6 months helps us  monitor your health and provide consistent, personalized care.   Referrals If a referral was made during today's visit and you haven't received any updates within two weeks, please contact the referred provider directly to check on the status.  Recommended Screenings:  Keep up the good work!  Health Maintenance  Topic Date Due   Cologuard (Stool DNA test)  Never done   COVID-19 Vaccine (3 - Pfizer risk series) 12/03/2019   Medicare Annual Wellness Visit  06/11/2024   Breast Cancer Screening  06/21/2024   Zoster (Shingles) Vaccine (1 of 2) 09/01/2024*   DEXA scan (bone density measurement)  06/25/2025   DTaP/Tdap/Td vaccine (3 - Td or Tdap) 03/01/2031   Pneumococcal Vaccine for age over 37  Completed   Flu Shot  Completed   Hepatitis C Screening  Completed   Meningitis B Vaccine  Aged Out   Colon Cancer Screening  Discontinued  *Topic was postponed. The date shown is not the original due date.       07/01/2024    8:51 AM  Advanced Directives  Does Patient Have a Medical Advance Directive? No;Yes  Type of Advance Directive Living will  Does patient want to make changes to medical advance directive? No - Patient declined    Vision: Annual vision screenings are recommended for early detection of glaucoma, cataracts, and diabetic retinopathy. These exams can also reveal signs of chronic conditions such as diabetes and high blood pressure.  Dental:  Annual dental screenings help detect early signs of oral cancer, gum disease, and other conditions linked to overall health, including heart disease and diabetes.  Please see the attached documents for additional preventive care recommendations.   Fall Prevention in the Home, Adult Falls can cause injuries and affect people of all ages. There are many simple things that you can do to make your home safe and to help prevent falls. If you need it, ask for help making these changes. What actions can I take to prevent falls? General information Use good lighting in all rooms. Make sure to: Replace any light bulbs that burn out. Turn on lights if it is dark and use night-lights. Keep items that you use often in easy-to-reach places. Lower the shelves around your home if needed. Move furniture so that there are clear paths around it. Do not keep throw rugs or other things on the floor that can make you trip. If any of your floors are uneven, fix them. Add color or contrast paint or tape to clearly mark and help you see: Grab bars or handrails. First and last steps of staircases. Where the edge of each step is. If you use a ladder or stepladder: Make sure that it is fully opened. Do not climb a closed ladder. Make sure the sides of the ladder are locked in place. Have someone hold the ladder while you use it. Know where your pets are as you move through your home. What can I do  in the bathroom?     Keep the floor dry. Clean up any water that is on the floor right away. Remove soap buildup in the bathtub or shower. Buildup makes bathtubs and showers slippery. Use non-skid mats or decals on the floor of the bathtub or shower. Attach bath mats securely with double-sided, non-slip rug tape. If you need to sit down while you are in the shower, use a non-slip stool. Install grab bars by the toilet and in the bathtub and shower. Do not use towel bars as grab bars. What can I do in the  bedroom? Make sure that you have a light by your bed that is easy to reach. Do not use any sheets or blankets on your bed that hang to the floor. Have a firm bench or chair with side arms that you can use for support when you get dressed. What can I do in the kitchen? Clean up any spills right away. If you need to reach something above you, use a sturdy step stool that has a grab bar. Keep electrical cables out of the way. Do not use floor polish or wax that makes floors slippery. What can I do with my stairs? Do not leave anything on the stairs. Make sure that you have a light switch at the top and the bottom of the stairs. Have them installed if you do not have them. Make sure that there are handrails on both sides of the stairs. Fix handrails that are broken or loose. Make sure that handrails are as long as the staircases. Install non-slip stair treads on all stairs in your home if they do not have carpet. Avoid having throw rugs at the top or bottom of stairs, or secure the rugs with carpet tape to prevent them from moving. Choose a carpet design that does not hide the edge of steps on the stairs. Make sure that carpet is firmly attached to the stairs. Fix any carpet that is loose or worn. What can I do on the outside of my home? Use bright outdoor lighting. Repair the edges of walkways and driveways and fix any cracks. Clear paths of anything that can make you trip, such as tools or rocks. Add color or contrast paint or tape to clearly mark and help you see high doorway thresholds. Trim any bushes or trees on the main path into your home. Check that handrails are securely fastened and in good repair. Both sides of all steps should have handrails. Install guardrails along the edges of any raised decks or porches. Have leaves, snow, and ice cleared regularly. Use sand, salt, or ice melt on walkways during winter months if you live where there is ice and snow. In the garage, clean up any  spills right away, including grease or oil spills. What other actions can I take? Review your medicines with your health care provider. Some medicines can make you confused or feel dizzy. This can increase your chance of falling. Wear closed-toe shoes that fit well and support your feet. Wear shoes that have rubber soles and low heels. Use a cane, walker, scooter, or crutches that help you move around if needed. Talk with your provider about other ways that you can decrease your risk of falls. This may include seeing a physical therapist to learn to do exercises to improve movement and strength. Where to find more information Centers for Disease Control and Prevention, [employer]AD[employer]: tonerpromos.no [employer] on Aging: baseringtones.pl National Institute on Aging: baseringtones.pl Contact a  health care provider if: You are afraid of falling at home. You feel weak, drowsy, or dizzy at home. You fall at home. Get help right away if you: Lose consciousness or have trouble moving after a fall. Have a fall that causes a head injury. These symptoms may be an emergency. Get help right away. Call 911. Do not wait to see if the symptoms will go away. Do not drive yourself to the hospital. This information is not intended to replace advice given to you by your health care provider. Make sure you discuss any questions you have with your health care provider. Document Revised: 04/03/2022 Document Reviewed: 04/03/2022 Elsevier Patient Education  2024 [employer].

## 2024-07-01 NOTE — Progress Notes (Signed)
 Contacted via MyChart   Normal mammogram, may repeat in one year:)

## 2024-07-03 ENCOUNTER — Other Ambulatory Visit: Payer: Self-pay

## 2024-07-03 MED ORDER — DENOSUMAB 60 MG/ML ~~LOC~~ SOSY: 60.0000 mg | PREFILLED_SYRINGE | SUBCUTANEOUS | 1 refills | Status: DC

## 2024-07-09 ENCOUNTER — Encounter: Payer: Self-pay | Admitting: Nurse Practitioner

## 2024-07-14 ENCOUNTER — Encounter: Payer: Self-pay | Admitting: Nurse Practitioner

## 2024-07-14 DIAGNOSIS — G8929 Other chronic pain: Secondary | ICD-10-CM

## 2024-07-14 MED ORDER — ALENDRONATE SODIUM 70 MG PO TABS: 70.0000 mg | ORAL_TABLET | ORAL | 11 refills | Status: AC

## 2024-08-08 ENCOUNTER — Encounter: Payer: Self-pay | Admitting: Nurse Practitioner

## 2024-08-18 ENCOUNTER — Encounter: Payer: Self-pay | Admitting: Nurse Practitioner

## 2024-08-18 ENCOUNTER — Ambulatory Visit: Admitting: Nurse Practitioner

## 2024-08-18 VITALS — BP 126/64 | HR 57 | Temp 98.1°F | Resp 15 | Ht 67.01 in | Wt 201.8 lb

## 2024-08-18 DIAGNOSIS — R051 Acute cough: Secondary | ICD-10-CM

## 2024-08-18 MED ORDER — AMOXICILLIN 500 MG PO CAPS: 500.0000 mg | ORAL_CAPSULE | Freq: Two times a day (BID) | ORAL | 0 refills | Status: AC

## 2024-08-18 NOTE — Progress Notes (Signed)
 "  BP 126/64 (BP Location: Left Arm, Patient Position: Sitting, Cuff Size: Large)   Pulse (!) 57   Temp 98.1 F (36.7 C) (Oral)   Resp 15   Ht 5' 7.01 (1.702 m)   Wt 201 lb 12.8 oz (91.5 kg)   SpO2 95%   BMI 31.60 kg/m    Subjective:    Patient ID: [name] Robinson, female    DOB: January 27, 1951, 74 y.o.   MRN: [id number]  HPI: [name] Robinson is a 74 y.o. female  Chief Complaint  Patient presents with   Chest congestion    Ongoing since day after Xmas. Ongoing coughing, SOB, fatigued, weak and unable to get anything to come up.    UPPER RESPIRATORY TRACT INFECTION Worst symptom: Patient states her symptoms have been ongoing for about 10 days.   Fever: no Cough: yes Shortness of breath: no Wheezing: no Chest pain: no Chest tightness: yes Chest congestion: yes Nasal congestion: no Runny nose: yes Post nasal drip: yes Sneezing: yes Sore throat: yes Swollen glands: no Sinus pressure: no Headache: no Face pain: no Toothache: no Ear pain: no bilateral Ear pressure: no bilateral Eyes red/itching:no Eye drainage/crusting: no  Vomiting: no Rash: no Fatigue: yes Sick contacts: yes Strep contacts: no  Context: stable Recurrent sinusitis: no Relief with OTC cold/cough medications: no  Treatments attempted: mucinex   Relevant past medical, surgical, family and social history reviewed and updated as indicated. Interim medical history since our last visit reviewed. Allergies and medications reviewed and updated.  Review of Systems  Constitutional:  Positive for fatigue. Negative for fever.  HENT:  Positive for congestion, postnasal drip, rhinorrhea, sneezing and sore throat. Negative for dental problem, ear pain, sinus pressure and sinus pain.   Respiratory:  Positive for cough. Negative for shortness of breath and wheezing.   Cardiovascular:  Negative for chest pain.  Gastrointestinal:  Negative for vomiting.  Skin:  Negative for rash.  Neurological:  Negative for  headaches.    Per HPI unless specifically indicated above     Objective:    BP 126/64 (BP Location: Left Arm, Patient Position: Sitting, Cuff Size: Large)   Pulse (!) 57   Temp 98.1 F (36.7 C) (Oral)   Resp 15   Ht 5' 7.01 (1.702 m)   Wt 201 lb 12.8 oz (91.5 kg)   SpO2 95%   BMI 31.60 kg/m   Wt Readings from Last 3 Encounters:  08/18/24 201 lb 12.8 oz (91.5 kg)  07/01/24 185 lb (83.9 kg)  06/03/24 202 lb 12.8 oz (92 kg)    Physical Exam Vitals and nursing note reviewed.  Constitutional:      General: She is not in acute distress.    Appearance: Normal appearance. She is normal weight. She is not ill-appearing, toxic-appearing or diaphoretic.  HENT:     Head: Normocephalic.     Right Ear: External ear normal. A middle ear effusion is present.     Left Ear: External ear normal. A middle ear effusion is present.     Nose: Congestion and rhinorrhea present.     Mouth/Throat:     Mouth: Mucous membranes are moist.     Pharynx: Oropharynx is clear. Posterior oropharyngeal erythema present. No oropharyngeal exudate.  Eyes:     General:        Right eye: No discharge.        Left eye: No discharge.     Extraocular Movements: Extraocular movements intact.     Conjunctiva/sclera: Conjunctivae  normal.     Pupils: Pupils are equal, round, and reactive to light.  Cardiovascular:     Rate and Rhythm: Normal rate and regular rhythm.     Heart sounds: No murmur heard. Pulmonary:     Effort: Pulmonary effort is normal. No respiratory distress.     Breath sounds: Normal breath sounds. No wheezing or rales.  Musculoskeletal:     Cervical back: Normal range of motion and neck supple.  Skin:    General: Skin is warm and dry.     Capillary Refill: Capillary refill takes less than 2 seconds.  Neurological:     General: No focal deficit present.     Mental Status: She is alert and oriented to person, place, and time. Mental status is at baseline.  Psychiatric:        Mood and  Affect: Mood normal.        Behavior: Behavior normal.        Thought Content: Thought content normal.        Judgment: Judgment normal.     Results for orders placed or performed in visit on 06/03/24  T4, free   Collection Time: 06/03/24  2:10 PM  Result Value Ref Range   Free T4 1.23 0.82 - 1.77 ng/dL  CBC with Differential/Platelet   Collection Time: 06/03/24  2:10 PM  Result Value Ref Range   WBC 5.9 3.4 - 10.8 x10E3/uL   RBC 4.04 3.77 - 5.28 x10E6/uL   Hemoglobin 12.0 11.1 - 15.9 g/dL   Hematocrit 63.3 65.9 - 46.6 %   MCV 91 79 - 97 fL   MCH 29.7 26.6 - 33.0 pg   MCHC 32.8 31.5 - 35.7 g/dL   RDW 87.9 88.2 - 84.5 %   Platelets 170 150 - 450 x10E3/uL   Neutrophils 71 Not Estab. %   Lymphs 23 Not Estab. %   Monocytes 5 Not Estab. %   Eos 0 Not Estab. %   Basos 1 Not Estab. %   Neutrophils Absolute 4.2 1.4 - 7.0 x10E3/uL   Lymphocytes Absolute 1.4 0.7 - 3.1 x10E3/uL   Monocytes Absolute 0.3 0.1 - 0.9 x10E3/uL   EOS (ABSOLUTE) 0.0 0.0 - 0.4 x10E3/uL   Basophils Absolute 0.0 0.0 - 0.2 x10E3/uL   Immature Granulocytes 0 Not Estab. %   Immature Grans (Abs) 0.0 0.0 - 0.1 x10E3/uL  Comprehensive metabolic panel with GFR   Collection Time: 06/03/24  2:10 PM  Result Value Ref Range   Glucose 75 70 - 99 mg/dL   BUN 17 8 - 27 mg/dL   Creatinine, Ser 9.35 0.57 - 1.00 mg/dL   eGFR 94 >40 fO/fpw/8.26   BUN/Creatinine Ratio 27 12 - 28   Sodium 141 134 - 144 mmol/L   Potassium 4.0 3.5 - 5.2 mmol/L   Chloride 105 96 - 106 mmol/L   CO2 24 20 - 29 mmol/L   Calcium 8.8 8.7 - 10.3 mg/dL   Total Protein 6.0 6.0 - 8.5 g/dL   Albumin 4.1 3.8 - 4.8 g/dL   Globulin, Total 1.9 1.5 - 4.5 g/dL   Bilirubin Total 0.4 0.0 - 1.2 mg/dL   Alkaline Phosphatase 98 49 - 135 IU/L   AST 12 0 - 40 IU/L   ALT 13 0 - 32 IU/L  TSH   Collection Time: 06/03/24  2:10 PM  Result Value Ref Range   TSH 0.991 0.450 - 4.500 uIU/mL  Lipid Panel w/o Chol/HDL Ratio   Collection Time: 06/03/24  2:10 PM   Result Value Ref Range   Cholesterol, Total 160 100 - 199 mg/dL   Triglycerides 888 0 - 149 mg/dL   HDL 78 >60 mg/dL   VLDL Cholesterol Cal 19 5 - 40 mg/dL   LDL Chol Calc (NIH) 63 0 - 99 mg/dL  Magnesium   Collection Time: 06/03/24  2:10 PM  Result Value Ref Range   Magnesium 2.0 1.6 - 2.3 mg/dL  Vitamin B12   Collection Time: 06/03/24  2:10 PM  Result Value Ref Range   Vitamin B-12 390 232 - 1,245 pg/mL  Ferritin   Collection Time: 06/03/24  2:10 PM  Result Value Ref Range   Ferritin 707 (H) 15 - 150 ng/mL  Iron   Collection Time: 06/03/24  2:10 PM  Result Value Ref Range   Iron 51 27 - 139 ug/dL  VITAMIN D  25 Hydroxy (Vit-D Deficiency, Fractures)   Collection Time: 06/03/24  2:10 PM  Result Value Ref Range   Vit D, 25-Hydroxy 27.8 (L) 30.0 - 100.0 ng/mL      Assessment & Plan:   Problem List Items Addressed This Visit   None Visit Diagnoses       Acute cough    -  Primary   Will treat with amoxicillin .  Recommend OTC symptom management. Recommend resting and staying well hydrated. FU if not improved.        Follow up plan: No follow-ups on file.      "

## 2024-10-30 ENCOUNTER — Ambulatory Visit: Admitting: Dermatology

## 2024-11-03 ENCOUNTER — Ambulatory Visit: Admitting: Nurse Practitioner

## 2024-12-03 ENCOUNTER — Ambulatory Visit: Admitting: Nurse Practitioner

## 2025-07-02 ENCOUNTER — Ambulatory Visit
